# Patient Record
Sex: Male | Born: 1960 | Race: Black or African American | Hispanic: No | Marital: Single | Smoking: Current some day smoker
Health system: Southern US, Community
[De-identification: ages and names within clinical notes are randomized; demographics above are authoritative.]

## PROBLEM LIST (undated history)

## (undated) DIAGNOSIS — F1910 Other psychoactive substance abuse, uncomplicated: Secondary | ICD-10-CM

## (undated) HISTORY — PX: OTHER SURGICAL HISTORY: SHX169

---

## 1999-02-13 ENCOUNTER — Emergency Department (HOSPITAL_COMMUNITY): Admission: EM | Admit: 1999-02-13 | Discharge: 1999-02-13 | Payer: Self-pay | Admitting: Emergency Medicine

## 2006-11-01 ENCOUNTER — Emergency Department (HOSPITAL_COMMUNITY): Admission: EM | Admit: 2006-11-01 | Discharge: 2006-11-01 | Payer: Self-pay | Admitting: Emergency Medicine

## 2006-11-19 ENCOUNTER — Emergency Department (HOSPITAL_COMMUNITY): Admission: EM | Admit: 2006-11-19 | Discharge: 2006-11-19 | Payer: Self-pay | Admitting: Family Medicine

## 2007-04-26 ENCOUNTER — Encounter: Admission: RE | Admit: 2007-04-26 | Discharge: 2007-04-26 | Payer: Self-pay | Admitting: Internal Medicine

## 2009-01-13 ENCOUNTER — Emergency Department (HOSPITAL_COMMUNITY): Admission: EM | Admit: 2009-01-13 | Discharge: 2009-01-13 | Payer: Self-pay | Admitting: Emergency Medicine

## 2010-06-24 LAB — BASIC METABOLIC PANEL
BUN: 9 mg/dL (ref 6–23)
Calcium: 9.2 mg/dL (ref 8.4–10.5)
Chloride: 102 mEq/L (ref 96–112)
Glucose, Bld: 102 mg/dL — ABNORMAL HIGH (ref 70–99)
Potassium: 3.3 mEq/L — ABNORMAL LOW (ref 3.5–5.1)
Sodium: 138 mEq/L (ref 135–145)

## 2010-06-24 LAB — URINALYSIS, ROUTINE W REFLEX MICROSCOPIC
Hgb urine dipstick: NEGATIVE
Nitrite: NEGATIVE

## 2010-06-24 LAB — GC/CHLAMYDIA PROBE AMP, URINE
Chlamydia, Swab/Urine, PCR: NEGATIVE
GC Probe Amp, Urine: NEGATIVE

## 2010-06-24 LAB — CBC
HCT: 44.9 % (ref 39.0–52.0)
Hemoglobin: 15.4 g/dL (ref 13.0–17.0)
MCHC: 34.2 g/dL (ref 30.0–36.0)
MCV: 102.2 fL — ABNORMAL HIGH (ref 78.0–100.0)
Platelets: 169 10*3/uL (ref 150–400)
WBC: 3.9 10*3/uL — ABNORMAL LOW (ref 4.0–10.5)

## 2010-06-24 LAB — SAMPLE TO BLOOD BANK

## 2010-06-24 LAB — URINE MICROSCOPIC-ADD ON

## 2010-06-24 LAB — URINE CULTURE: Culture: NO GROWTH

## 2016-01-27 ENCOUNTER — Encounter (HOSPITAL_COMMUNITY): Payer: Self-pay | Admitting: Emergency Medicine

## 2016-01-27 ENCOUNTER — Ambulatory Visit (HOSPITAL_COMMUNITY)
Admission: EM | Admit: 2016-01-27 | Discharge: 2016-01-27 | Disposition: A | Discharge status: Home / Self Care | Payer: Self-pay | Attending: Family Medicine | Admitting: Family Medicine

## 2016-01-27 DIAGNOSIS — Z202 Contact with and (suspected) exposure to infections with a predominantly sexual mode of transmission: Secondary | ICD-10-CM | POA: Insufficient documentation

## 2016-01-27 DIAGNOSIS — F17210 Nicotine dependence, cigarettes, uncomplicated: Secondary | ICD-10-CM | POA: Insufficient documentation

## 2016-01-27 MED ORDER — CEFTRIAXONE SODIUM 250 MG IJ SOLR
250.0000 mg | Freq: Once | INTRAMUSCULAR | Status: AC
  Administered 2016-01-27: 250 mg via INTRAMUSCULAR

## 2016-01-27 MED ORDER — AZITHROMYCIN 250 MG PO TABS
ORAL_TABLET | ORAL | Status: AC
  Filled 2016-01-27: qty 4

## 2016-01-27 MED ORDER — CEFTRIAXONE SODIUM 250 MG IJ SOLR
INTRAMUSCULAR | Status: AC
  Filled 2016-01-27: qty 250

## 2016-01-27 MED ORDER — AZITHROMYCIN 250 MG PO TABS
1000.0000 mg | ORAL_TABLET | Freq: Once | ORAL | Status: AC
  Administered 2016-01-27: 1000 mg via ORAL

## 2016-01-27 NOTE — ED Provider Notes (Signed)
[facility]    CSN: [id number] Arrival date & time: 01/27/16  1627     History   Chief Complaint Chief Complaint  Patient presents with  . Exposure to STD    HPI Timothy Baker is a 55 y.o. male.   The history is provided by the patient.  Exposure to STD  This is a new problem. The current episode started yesterday (told by male yest of [facility]  [facility] The problem has not changed since onset.Pertinent negatives include no chest pain and no abdominal pain. Nothing aggravates the symptoms. Nothing relieves the symptoms.    History reviewed. No pertinent past medical history.  There are no active problems to display for this patient.   History reviewed. No pertinent surgical history.     Home Medications    Prior to Admission medications   Not on File    Family History History reviewed. No pertinent family history.  Social History Social History  Substance Use Topics  . Smoking status: Current Some Day Smoker    Years: 25.00    Types: Cigarettes  . Smokeless tobacco: Never Used  . Alcohol use No     Allergies   Patient has no known allergies.   Review of Systems Review of Systems  Constitutional: Negative.   Cardiovascular: Negative for chest pain.  Gastrointestinal: Negative for abdominal pain.  Genitourinary: Negative for discharge, dysuria, penile pain, penile swelling, scrotal swelling and testicular pain.  All other systems reviewed and are negative.    Physical Exam Triage Vital Signs ED Triage Vitals  Enc Vitals Group     BP 01/27/16 1659 154/74     Pulse Rate 01/27/16 1659 92     Resp 01/27/16 1659 18     Temp 01/27/16 1659 98.7 F (37.1 C)     Temp Source 01/27/16 1659 Oral     SpO2 01/27/16 1659 100 %     Weight --      Height --      Head Circumference --      Peak Flow --      Pain Score 01/27/16 1703 0     Pain Loc --      Pain Edu? --      Excl. in GC? --    No data  found.   Updated Vital Signs BP 154/74 (BP Location: Left Arm)   Pulse 92   Temp 98.7 F (37.1 C) (Oral)   Resp 18   SpO2 100%   Visual Acuity Right Eye Distance:   Left Eye Distance:   Bilateral Distance:    Right Eye Near:   Left Eye Near:    Bilateral Near:     Physical Exam  Constitutional: He is oriented to person, place, and time. He appears well-developed and well-nourished. No distress.  Abdominal: Soft. Bowel sounds are normal.  Genitourinary: Penis normal.  Neurological: He is alert and oriented to person, place, and time.  Nursing note and vitals reviewed.    UC Treatments / Results  Labs (all labs ordered are listed, but only abnormal results are displayed) Labs Reviewed - No data to display  EKG  EKG Interpretation None       Radiology No results found.  Procedures Procedures (including critical care time)  Medications Ordered in UC Medications - No data to display   Initial Impression / Assessment and Plan / UC Course  I have reviewed the triage vital signs and the nursing  notes.  Pertinent labs & imaging results that were available during my care of the patient were reviewed by me and considered in my medical decision making (see chart for details).  Clinical Course       Final Clinical Impressions(s) / UC Diagnoses   Final diagnoses:  None    New Prescriptions New Prescriptions   No medications on file     Linna HoffJames D Dekota Shenk, MD 01/27/16 1737

## 2016-01-27 NOTE — Discharge Instructions (Signed)
We will call with test results and treat as indicated °

## 2016-01-27 NOTE — ED Triage Notes (Signed)
[facility] with a [facility] of an exposure to an STD. The patient stated that one of his partners called him and advised him that she had tested positive for an STD 1 week ago. The patient was unsure of the type of STD.

## 2016-01-28 LAB — URINE CYTOLOGY ANCILLARY ONLY
Chlamydia: NEGATIVE
NEISSERIA GONORRHEA: NEGATIVE
TRICH (WINDOWPATH): POSITIVE — AB

## 2016-01-30 ENCOUNTER — Telehealth (HOSPITAL_COMMUNITY): Payer: Self-pay | Admitting: Emergency Medicine

## 2016-01-30 MED ORDER — METRONIDAZOLE 500 MG PO TABS: 500.0000 mg | ORAL_TABLET | Freq: Two times a day (BID) | ORAL | 0 refills | Status: DC

## 2016-01-30 NOTE — Telephone Encounter (Signed)
Called pt and notified of recent lab results Pt ID'd properly... Reports feeling better and sx have subsided Per his request... E-Rx med to [facility] ([address]) Adv pt if sx are not getting better to return or to f/u w/PCP Education on safe sex given Also adv pt to notify partner(s) Pt verb understanding.

## 2016-01-30 NOTE — Telephone Encounter (Signed)
-----   Message from Charm RingsErin J Honig, MD sent at 01/28/2016  5:37 PM EST ----- Please notify patient that he is positive for trichomonas.  Call in Flagyl 500mg  BID x7 days, #14 to pharmacy of choice.  No alcohol while taking flagyl.  EH

## 2016-02-01 LAB — URINE CYTOLOGY ANCILLARY ONLY: Bacterial vaginitis: POSITIVE — AB

## 2017-01-19 ENCOUNTER — Other Ambulatory Visit: Payer: Self-pay | Admitting: Occupational Medicine

## 2017-01-19 ENCOUNTER — Ambulatory Visit: Payer: Self-pay

## 2017-01-19 DIAGNOSIS — M79672 Pain in left foot: Secondary | ICD-10-CM

## 2017-07-23 ENCOUNTER — Emergency Department (HOSPITAL_COMMUNITY)
Admission: EM | Admit: 2017-07-23 | Discharge: 2017-07-23 | Disposition: A | Discharge status: Home / Self Care | Payer: Self-pay | Attending: Emergency Medicine | Admitting: Emergency Medicine

## 2017-07-23 ENCOUNTER — Emergency Department (HOSPITAL_COMMUNITY): Payer: Self-pay

## 2017-07-23 ENCOUNTER — Encounter (HOSPITAL_COMMUNITY): Payer: Self-pay | Admitting: Emergency Medicine

## 2017-07-23 DIAGNOSIS — J40 Bronchitis, not specified as acute or chronic: Secondary | ICD-10-CM

## 2017-07-23 DIAGNOSIS — F17210 Nicotine dependence, cigarettes, uncomplicated: Secondary | ICD-10-CM | POA: Insufficient documentation

## 2017-07-23 DIAGNOSIS — B9789 Other viral agents as the cause of diseases classified elsewhere: Secondary | ICD-10-CM

## 2017-07-23 DIAGNOSIS — J069 Acute upper respiratory infection, unspecified: Secondary | ICD-10-CM

## 2017-07-23 MED ORDER — GUAIFENESIN ER 1200 MG PO TB12: 1.0000 | ORAL_TABLET | Freq: Two times a day (BID) | ORAL | 0 refills | Status: DC

## 2017-07-23 MED ORDER — DOXYCYCLINE HYCLATE 100 MG PO CAPS: 100.0000 mg | ORAL_CAPSULE | Freq: Two times a day (BID) | ORAL | 0 refills | Status: DC

## 2017-07-23 MED ORDER — PREDNISONE 50 MG PO TABS: 50.0000 mg | ORAL_TABLET | Freq: Every day | ORAL | 0 refills | Status: DC

## 2017-07-23 MED ORDER — PROMETHAZINE-DM 6.25-15 MG/5ML PO SYRP: 5.0000 mL | ORAL_SOLUTION | Freq: Four times a day (QID) | ORAL | 0 refills | Status: DC | PRN

## 2017-07-23 MED ORDER — AEROCHAMBER PLUS FLO-VU MEDIUM MISC
1.0000 | Freq: Once | Status: DC
  Filled 2017-07-23: qty 1

## 2017-07-23 MED ORDER — ALBUTEROL SULFATE HFA 108 (90 BASE) MCG/ACT IN AERS: 2.0000 | INHALATION_SPRAY | Freq: Four times a day (QID) | RESPIRATORY_TRACT | Status: DC

## 2017-07-23 NOTE — ED Triage Notes (Signed)
Pt reports productive cough and scratchy throat x2 weeks. Pt reports he feels like he has had a fever at home, took ibuprofen for it.

## 2017-07-23 NOTE — Discharge Instructions (Addendum)
Your chest x-ray did not show any signs of pneumonia at this time.  Follow-up with your primary care doctor for a recheck.  Increase your fluid intake and rest as much as possible.  I would advise to decrease her smoking as much as possible until you start to improve.

## 2017-07-23 NOTE — ED Provider Notes (Signed)
[facility] EMERGENCY DEPARTMENT Provider Note   CSN: [id number] Arrival date & time: 07/23/17  0706     History   Chief Complaint Chief Complaint  Patient presents with  . URI    HPI SOPHEAP BOEHLE is a 57 y.o. male.  HPI Patient presents to the emergency department with cough nasal congestion sore throat over the last 2 weeks.  The patient states that he has been taking over-the-counter cough and cold medications without significant relief of his symptoms.  The patient states he is a smoker.  The patient states that he is now coughs so much these having chest discomfort when he coughs and takes a deep breath.  Patient states nothing seems to make the condition better.  Patient denies nausea, vomiting, headache, blurred vision, throat swelling, weakness, dizziness, fever or syncope. History reviewed. No pertinent past medical history.  There are no active problems to display for this patient.   History reviewed. No pertinent surgical history.      Home Medications    Prior to Admission medications   Medication Sig Start Date End Date Taking? Authorizing Provider  metroNIDAZOLE (FLAGYL) 500 MG tablet Take 1 tablet (500 mg total) by mouth 2 (two) times daily. 01/30/16   Charm Rings, MD    Family History No family history on file.  Social History Social History   Tobacco Use  . Smoking status: Current Some Day Smoker    Years: 25.00    Types: Cigarettes  . Smokeless tobacco: Never Used  Substance Use Topics  . Alcohol use: No  . Drug use: No     Allergies   Patient has no known allergies.   Review of Systems Review of Systems All other systems negative except as documented in the HPI. All pertinent positives and negatives as reviewed in the HPI.  Physical Exam Updated Vital Signs BP (!) 149/102   Pulse (!) 105   Temp 99.4 F (37.4 C) (Oral)   Resp 15   SpO2 100%   Physical Exam  Constitutional: He is oriented to person,  place, and time. He appears well-developed and well-nourished. No distress.  HENT:  Head: Normocephalic and atraumatic.  Mouth/Throat: Oropharynx is clear and moist.  Eyes: Pupils are equal, round, and reactive to light.  Neck: Normal range of motion. Neck supple.  Cardiovascular: Normal rate, regular rhythm and normal heart sounds. Exam reveals no gallop and no friction rub.  No murmur heard. Pulmonary/Chest: Effort normal and breath sounds normal. No respiratory distress. He has no wheezes.  Neurological: He is alert and oriented to person, place, and time. He exhibits normal muscle tone. Coordination normal.  Skin: Skin is warm and dry. Capillary refill takes less than 2 seconds. No rash noted. No erythema.  Psychiatric: He has a normal mood and affect. His behavior is normal.  Nursing note and vitals reviewed.    ED Treatments / Results  Labs (all labs ordered are listed, but only abnormal results are displayed) Labs Reviewed - No data to display  EKG None  Radiology Dg Chest 2 View  Result Date: 07/23/2017 CLINICAL DATA:  57 year old male with history of cough for the past 2 weeks. Central chest pain. EXAM: CHEST - 2 VIEW COMPARISON:  No priors. FINDINGS: Lung volumes are normal. No consolidative airspace disease. No pleural effusions. No pneumothorax. No pulmonary nodule or mass noted. Pulmonary vasculature and the cardiomediastinal silhouette are within normal limits. IMPRESSION: No radiographic evidence of acute cardiopulmonary disease. Electronically Signed  By: Trudie Reed M.D.   On: 07/23/2017 08:24    Procedures Procedures (including critical care time)  Medications Ordered in ED Medications - No data to display   Initial Impression / Assessment and Plan / ED Course  I have reviewed the triage vital signs and the nursing notes.  Pertinent labs & imaging results that were available during my care of the patient were reviewed by me and considered in my medical  decision making (see chart for details).    Patient be treated for a viral URI with cough.  Patient is a smoker and have explained to him that this will make this condition more protracted due to the chronic irritation from his smoking.  Patient is advised to increase his fluid intake and rest as much as possible. Final Clinical Impressions(s) / ED Diagnoses   Final diagnoses:  None    ED Discharge Orders    None       Charlestine Night, PA-C 07/23/17 0949    Terrilee Files, MD 07/24/17 [phone]

## 2017-10-04 ENCOUNTER — Emergency Department (HOSPITAL_COMMUNITY): Payer: BLUE CROSS/BLUE SHIELD

## 2017-10-04 ENCOUNTER — Emergency Department (HOSPITAL_COMMUNITY)
Admission: EM | Admit: 2017-10-04 | Discharge: 2017-10-04 | Disposition: A | Discharge status: Home / Self Care | Payer: BLUE CROSS/BLUE SHIELD | Attending: Emergency Medicine | Admitting: Emergency Medicine

## 2017-10-04 ENCOUNTER — Other Ambulatory Visit: Payer: Self-pay

## 2017-10-04 ENCOUNTER — Encounter (HOSPITAL_COMMUNITY): Payer: Self-pay | Admitting: Emergency Medicine

## 2017-10-04 DIAGNOSIS — T675XXA Heat exhaustion, unspecified, initial encounter: Secondary | ICD-10-CM | POA: Insufficient documentation

## 2017-10-04 DIAGNOSIS — Z79899 Other long term (current) drug therapy: Secondary | ICD-10-CM | POA: Insufficient documentation

## 2017-10-04 DIAGNOSIS — N3090 Cystitis, unspecified without hematuria: Secondary | ICD-10-CM | POA: Diagnosis not present

## 2017-10-04 DIAGNOSIS — R42 Dizziness and giddiness: Secondary | ICD-10-CM | POA: Diagnosis not present

## 2017-10-04 DIAGNOSIS — F17210 Nicotine dependence, cigarettes, uncomplicated: Secondary | ICD-10-CM | POA: Insufficient documentation

## 2017-10-04 DIAGNOSIS — N3000 Acute cystitis without hematuria: Secondary | ICD-10-CM

## 2017-10-04 DIAGNOSIS — R079 Chest pain, unspecified: Secondary | ICD-10-CM | POA: Diagnosis present

## 2017-10-04 LAB — COMPREHENSIVE METABOLIC PANEL
ALBUMIN: 3.7 g/dL (ref 3.5–5.0)
ALT: 31 U/L (ref 0–44)
ANION GAP: 6 (ref 5–15)
AST: 33 U/L (ref 15–41)
Alkaline Phosphatase: 55 U/L (ref 38–126)
BILIRUBIN TOTAL: 0.7 mg/dL (ref 0.3–1.2)
BUN: 8 mg/dL (ref 6–20)
CHLORIDE: 106 mmol/L (ref 98–111)
CO2: 26 mmol/L (ref 22–32)
Calcium: 9.3 mg/dL (ref 8.9–10.3)
Creatinine, Ser: 1.09 mg/dL (ref 0.61–1.24)
GFR calc Af Amer: >60 mL/min (ref >60)
GLUCOSE: 95 mg/dL (ref 70–99)
POTASSIUM: 3.9 mmol/L (ref 3.5–5.1)
Sodium: 138 mmol/L (ref 135–145)
TOTAL PROTEIN: 6.9 g/dL (ref 6.5–8.1)

## 2017-10-04 LAB — CBC WITH DIFFERENTIAL/PLATELET
Abs Immature Granulocytes: 0 10*3/uL (ref 0.0–0.1)
Basophils Absolute: 0 10*3/uL (ref 0.0–0.1)
Basophils Relative: 0 %
EOS ABS: 0 10*3/uL (ref 0.0–0.7)
EOS PCT: 0 %
HEMATOCRIT: 42.3 % (ref 39.0–52.0)
Hemoglobin: 14 g/dL (ref 13.0–17.0)
IMMATURE GRANULOCYTES: 0 %
LYMPHS ABS: 1.8 10*3/uL (ref 0.7–4.0)
Lymphocytes Relative: 19 %
MCH: 31.6 pg (ref 26.0–34.0)
MCHC: 33.1 g/dL (ref 30.0–36.0)
MCV: 95.5 fL (ref 78.0–100.0)
Monocytes Absolute: 0.8 10*3/uL (ref 0.1–1.0)
Monocytes Relative: 8 %
NEUTROS PCT: 73 %
Neutro Abs: 7 10*3/uL (ref 1.7–7.7)
PLATELETS: 155 10*3/uL (ref 150–400)
RBC: 4.43 MIL/uL (ref 4.22–5.81)
RDW: 11.6 % (ref 11.5–15.5)
WBC: 9.6 10*3/uL (ref 4.0–10.5)

## 2017-10-04 LAB — URINALYSIS, ROUTINE W REFLEX MICROSCOPIC
Glucose, UA: NEGATIVE mg/dL
Hgb urine dipstick: NEGATIVE
Ketones, ur: NEGATIVE mg/dL
Nitrite: NEGATIVE
PROTEIN: 100 mg/dL — AB
SPECIFIC GRAVITY, URINE: 1.032 — AB (ref 1.005–1.030)
WBC, UA: >50 WBC/hpf — ABNORMAL HIGH (ref 0–5)
pH: 5 (ref 5.0–8.0)

## 2017-10-04 LAB — LIPASE, BLOOD: LIPASE: 26 U/L (ref 11–51)

## 2017-10-04 LAB — I-STAT TROPONIN, ED
TROPONIN I, POC: 0 ng/mL (ref 0.00–0.08)
TROPONIN I, POC: 0.01 ng/mL (ref 0.00–0.08)

## 2017-10-04 LAB — I-STAT CG4 LACTIC ACID, ED
LACTIC ACID, VENOUS: 1.46 mmol/L (ref 0.5–1.9)
LACTIC ACID, VENOUS: 0.42 mmol/L — AB (ref 0.5–1.9)

## 2017-10-04 MED ORDER — NITROFURANTOIN MONOHYD MACRO 100 MG PO CAPS: 100.0000 mg | ORAL_CAPSULE | Freq: Two times a day (BID) | ORAL | 0 refills | Status: DC

## 2017-10-04 NOTE — ED Provider Notes (Signed)
Patient placed in Quick Look pathway, seen and evaluated   Chief Complaint: fever  HPI:   Felt fever, light-headedness, weak, anorexia this morning. Had coworker check his BP and was told it was "high", unknown number. Took 4 ibuprofen PTA. +cough, +dysuria (sometimes), +chest pounding.   ROS: No nausea, vomiting, abdominal pain, changes in BM  Physical Exam:   Gen: No distress  Neuro: Awake and Alert  Skin: Warm    Focused Exam: Feels warm to touch.  Lungs CTAB.  Pain with deep inspiration pt points to central lower chest/epigastrium. Borderline tachy/febrile.    Initiation of care has begun. The patient has been counseled on the process, plan, and necessity for staying for the completion/evaluation, and the remainder of the medical screening examination   Liberty HandyGibbons, Claudia J, PA-C 10/04/17 1508    Melene PlanFloyd, Dan, DO 10/04/17 1617

## 2017-10-04 NOTE — ED Triage Notes (Signed)
Pt presents to ED for assessment of sudden onset on waking this morning of feeling feverish (99.9 at triage), nausea, decreased appetite, high BP at work, chest pressure, nasal congestion.  [facility]

## 2017-10-04 NOTE — ED Notes (Addendum)
Pt aware of urine specimen needed.  ?

## 2017-10-04 NOTE — Discharge Instructions (Signed)
Please read and follow all provided instructions.  Your diagnoses today include:  1. Heat exhaustion, initial encounter   2. Dizziness     Tests performed today include:  An EKG of your heart  A chest x-ray  Cardiac enzymes - a blood test for heart muscle damage  Blood counts and electrolytes  Urine test - shows urine infection  Vital signs. See below for your results today.   Medications prescribed:   Macrobid - antibiotic for urinary tract infection  You have been prescribed an antibiotic medicine: take the entire course of medicine even if you are feeling better. Stopping early can cause the antibiotic not to work.  Take any prescribed medications only as directed.  Follow-up instructions: Please follow-up with your primary care provider as soon as you can for further evaluation of your symptoms.   Double your fluid intake over the next 2 to 3 days and take plenty of breaks when working outside. Return instructions:  SEEK IMMEDIATE MEDICAL ATTENTION IF:  You have severe chest pain, especially if the pain is crushing or pressure-like and spreads to the arms, back, neck, or jaw, or if you have sweating, nausea (feeling sick to your stomach), or shortness of breath. THIS IS AN EMERGENCY. Don't wait to see if the pain will go away. Get medical help at once. Call 911 or 0 (operator). DO NOT drive yourself to the hospital.   Your chest pain gets worse and does not go away with rest.   You have an attack of chest pain lasting longer than usual, despite rest and treatment with the medications your caregiver has prescribed.   You wake from sleep with chest pain or shortness of breath.  You feel dizzy or faint.  You have chest pain not typical of your usual pain for which you originally saw your caregiver.   You have any other emergent concerns regarding your health.  Additional Information: Chest pain comes from many different causes. Your caregiver has diagnosed you as  having chest pain that is not specific for one problem, but does not require admission.  You are at low risk for an acute heart condition or other serious illness.   Your vital signs today were: BP 120/75    Pulse 69    Temp 98.8 F (37.1 C) (Oral)    Resp 17    SpO2 100%  If your blood pressure (BP) was elevated above 135/85 this visit, please have this repeated by your doctor within one month. --------------

## 2017-10-04 NOTE — ED Provider Notes (Signed)
[facility] EMERGENCY DEPARTMENT Provider Note   CSN: [id number] Arrival date & time: 10/04/17  1343     History   Chief Complaint Chief Complaint  Patient presents with  . Chest Pain    HPI Timothy Baker is a 57 y.o. male.  Patient with no significant past medical history presents with complaint of dizziness, lightheadedness, anorexia starting this morning.  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  He drank coffee and did his normal routine this morning.  He took a break at about 8:30am, drank a bit and ate a sandwich although he was not feeling well.  Patient continued to work.  Eventually he decided to go into a cooler area and drink some Gatorade because he was feeling dizzy.  Someone at his workplace took his blood pressure and it was noted to be in the 160s systolic range.  Patient had some vague chest pains without radiation during this time.  Symptoms have since resolved.  States that he has not had symptoms like this in the past.  No diaphoresis or vomiting.  Since being in the emergency department and resting, he feels much better.  No history of hypertension, high cholesterol, diabetes.  Patient is a smoker.  No strong family history of coronary artery disease.  Patient reports occasional cough, burning with urination, racing pounding heart.  The onset of this condition was acute. The course is constant. Aggravating factors: activity. Alleviating factors: rest.       History reviewed. No pertinent past medical history.  There are no active problems to display for this patient.   History reviewed. No pertinent surgical history.      Home Medications    Prior to Admission medications   Medication Sig Start Date End Date Taking? Authorizing Provider  doxycycline (VIBRAMYCIN) 100 MG capsule Take 1 capsule (100 mg total) by mouth 2 (two) times daily. Patient not taking: Reported on 10/04/2017 07/23/17   Charlestine Night, PA-C  Guaifenesin 1200 MG TB12  Take 1 tablet (1,200 mg total) by mouth 2 (two) times daily. Patient not taking: Reported on 10/04/2017 07/23/17   Charlestine Night, PA-C  metroNIDAZOLE (FLAGYL) 500 MG tablet Take 1 tablet (500 mg total) by mouth 2 (two) times daily. Patient not taking: Reported on 10/04/2017 01/30/16   Charm Rings, MD  predniSONE (DELTASONE) 50 MG tablet Take 1 tablet (50 mg total) by mouth daily. Patient not taking: Reported on 10/04/2017 07/23/17   Charlestine Night, PA-C  promethazine-dextromethorphan (PROMETHAZINE-DM) 6.25-15 MG/5ML syrup Take 5 mLs by mouth 4 (four) times daily as needed for cough. Patient not taking: Reported on 10/04/2017 07/23/17   Charlestine Night, PA-C    Family History History reviewed. No pertinent family history.  Social History Social History   Tobacco Use  . Smoking status: Current Some Day Smoker    Years: 25.00    Types: Cigarettes  . Smokeless tobacco: Never Used  Substance Use Topics  . Alcohol use: No  . Drug use: No     Allergies   Patient has no known allergies.   Review of Systems Review of Systems  Constitutional: Positive for appetite change. Negative for diaphoresis and fever.  Eyes: Negative for redness.  Respiratory: Negative for cough and shortness of breath.   Cardiovascular: Positive for chest pain and palpitations. Negative for leg swelling.  Gastrointestinal: Negative for abdominal pain, nausea and vomiting.  Genitourinary: Negative for dysuria.  Musculoskeletal: Negative for back pain and neck pain.  Skin: Negative for rash.  Neurological: Positive for dizziness and light-headedness. Negative for syncope.  Psychiatric/Behavioral: The patient is not nervous/anxious.      Physical Exam Updated Vital Signs BP 117/83 (BP Location: Left Arm)   Pulse 70   Temp 98.8 F (37.1 C) (Oral)   Resp 17   SpO2 100%   Physical Exam  Constitutional: He appears well-developed and well-nourished.  HENT:  Head: Normocephalic and atraumatic.    Mouth/Throat: Mucous membranes are normal. Mucous membranes are not dry.  Eyes: Conjunctivae are normal.  Neck: Trachea normal and normal range of motion. Neck supple. Normal carotid pulses and no JVD present. No muscular tenderness present. Carotid bruit is not present. No tracheal deviation present.  Cardiovascular: Normal rate, regular rhythm, S1 normal, S2 normal, normal heart sounds and intact distal pulses. Exam reveals no distant heart sounds and no decreased pulses.  No murmur heard. Pulmonary/Chest: Effort normal and breath sounds normal. No respiratory distress. He has no wheezes. He exhibits no tenderness.  Abdominal: Soft. Normal aorta and bowel sounds are normal. There is no tenderness. There is no rebound and no guarding.  Musculoskeletal: He exhibits no edema.       Right lower leg: He exhibits no tenderness and no edema.       Left lower leg: He exhibits no tenderness and no edema.  Neurological: He is alert.  Skin: Skin is warm and dry. He is not diaphoretic. No cyanosis. No pallor.  Psychiatric: He has a normal mood and affect.  Nursing note and vitals reviewed.    ED Treatments / Results  Labs (all labs ordered are listed, but only abnormal results are displayed) Labs Reviewed  URINALYSIS, ROUTINE W REFLEX MICROSCOPIC - Abnormal; Notable for the following components:      Result Value   Color, Urine AMBER (*)    APPearance CLOUDY (*)    Specific Gravity, Urine 1.032 (*)    Bilirubin Urine SMALL (*)    Protein, ur 100 (*)    Leukocytes, UA MODERATE (*)    WBC, UA >50 (*)    Bacteria, UA MANY (*)    Non Squamous Epithelial 0-5 (*)    All other components within normal limits  I-STAT CG4 LACTIC ACID, ED - Abnormal; Notable for the following components:   Lactic Acid, Venous 0.42 (*)    All other components within normal limits  URINE CULTURE  CBC WITH DIFFERENTIAL/PLATELET  COMPREHENSIVE METABOLIC PANEL  LIPASE, BLOOD  I-STAT TROPONIN, ED  I-STAT CG4 LACTIC  ACID, ED  I-STAT TROPONIN, ED    EKG EKG Interpretation  Date/Time:  Wednesday October 04 2017 13:49:34 EDT Ventricular Rate:  101 PR Interval:  112 QRS Duration: 76 QT Interval:  332 QTC Calculation: 430 R Axis:   72 Text Interpretation:  Sinus tachycardia Otherwise normal ECG no prior to compare with Confirmed by Meridee ScoreButler, Michael [phone]([id number]) on 10/04/2017 4:21:19 PM   Radiology Dg Chest 2 View  Result Date: 10/04/2017 CLINICAL DATA:  Chest pain EXAM: CHEST - 2 VIEW COMPARISON:  07/23/2017 FINDINGS: The heart size and mediastinal contours are within normal limits. Both lungs are clear. The visualized skeletal structures are unremarkable. IMPRESSION: No active cardiopulmonary disease. Electronically Signed   By: Deatra RobinsonKevin  Herman M.D.   On: 10/04/2017 15:32    Procedures Procedures (including critical care time)  Medications Ordered in ED Medications - No data to display   Initial Impression / Assessment and Plan / ED Course  I have reviewed the triage vital signs and the nursing  notes.  Pertinent labs & imaging results that were available during my care of the patient were reviewed by me and considered in my medical decision making (see chart for details).     Patient seen and examined.  Infectious work-up started in triage is negative.  Pending UA.  Given lightheadedness, vague chest pains will check a delta troponin and repeat EKG.  Patient has a low risk heart score.  Low concern for ACS.  Vital signs reviewed and are as follows: BP 117/83 (BP Location: Left Arm)   Pulse 70   Temp 98.8 F (37.1 C) (Oral)   Resp 17   SpO2 100%   6:38 PM remainder of lab work reassuring --although microscopic demonstrates bacteria with elevated white blood cell count.  Patient asked about signs of urethritis which he denies.  He states that he is sexually active with one partner, his wife.  He states that he was last checked for sexual transmitted infections about a year ago.  Home on macrobid  for this. Encouraged PCP f/u.   Final Clinical Impressions(s) / ED Diagnoses   Final diagnoses:  Heat exhaustion, initial encounter  Dizziness  Acute cystitis without hematuria   Patient with vague symptoms of chest pain and dizziness after working outdoors today.  Suspect element of heat exhaustion.  Urine is concentrated.  Remainder of cardiac work-up including troponin x2, EKG x2 are reassuring.  Acute cystitis: Patient has had some burning with urination at times.  Low concern for prostatitis or urethritis.  Treatment as above.  PCP follow-up indicated.  ED Discharge Orders        Ordered    nitrofurantoin, macrocrystal-monohydrate, (MACROBID) 100 MG capsule  2 times daily     10/04/17 1837       Renne Crigler, PA-C 10/04/17 1840    Terrilee Files, MD 10/06/17 1610

## 2017-10-07 LAB — URINE CULTURE: Culture: >=100000 — AB

## 2019-07-11 ENCOUNTER — Ambulatory Visit: Payer: Self-pay | Attending: Internal Medicine

## 2019-07-11 DIAGNOSIS — Z23 Encounter for immunization: Secondary | ICD-10-CM

## 2019-07-11 NOTE — Progress Notes (Signed)
   Covid-19 Vaccination Clinic  Name:  BODIN GORKA    MRN: [id number] DOB: 1960-09-09  07/11/2019  Mr. Gudino was observed post Covid-19 immunization for 15 minutes without incident. He was provided with Vaccine Information Sheet and instruction to access the V-Safe system.   Mr. Stalvey was instructed to call 911 with any severe reactions post vaccine: Marland Kitchen Difficulty breathing  . Swelling of face and throat  . A fast heartbeat  . A bad rash all over body  . Dizziness and weakness   Immunizations Administered    Name Date Dose VIS Date Route   Pfizer COVID-19 Vaccine 07/11/2019  3:46 PM 0.3 mL 05/15/2018 Intramuscular   Manufacturer: [employer], [employer]   Lot: [id number]   NDC: [address]-9

## 2019-08-05 ENCOUNTER — Ambulatory Visit: Payer: Self-pay | Attending: Internal Medicine

## 2019-08-05 DIAGNOSIS — Z23 Encounter for immunization: Secondary | ICD-10-CM

## 2019-08-05 NOTE — Progress Notes (Signed)
   Covid-19 Vaccination Clinic  Name:  Timothy Baker    MRN: [id number] DOB: June 25, 1960  08/05/2019  [name] was observed post Covid-19 immunization for 15 minutes without incident. He was provided with Vaccine Information Sheet and instruction to access the V-Safe system.   [name] was instructed to call 911 with any severe reactions post vaccine: Marland Kitchen Difficulty breathing  . Swelling of face and throat  . A fast heartbeat  . A bad rash all over body  . Dizziness and weakness   Immunizations Administered    Name Date Dose VIS Date Route   Pfizer COVID-19 Vaccine 08/05/2019  4:00 PM 0.3 mL 05/15/2018 Intramuscular   Manufacturer: [employer], [employer]   Lot: [id number]   NDC: [address]-3

## 2020-04-13 ENCOUNTER — Encounter (HOSPITAL_COMMUNITY): Payer: Self-pay

## 2020-04-13 ENCOUNTER — Other Ambulatory Visit: Payer: Self-pay

## 2020-04-13 DIAGNOSIS — F17210 Nicotine dependence, cigarettes, uncomplicated: Secondary | ICD-10-CM | POA: Diagnosis not present

## 2020-04-13 DIAGNOSIS — M545 Low back pain, unspecified: Secondary | ICD-10-CM | POA: Insufficient documentation

## 2020-04-13 NOTE — ED Triage Notes (Signed)
Patient arrived stating he has back pain every day but today when he had to use the restroom is was hard for him to get up out of the bed. Patient ambulatory in triage. Took aspirin at 8pm, states now it does not hurt.

## 2020-04-14 ENCOUNTER — Emergency Department (HOSPITAL_COMMUNITY)
Admission: EM | Admit: 2020-04-14 | Discharge: 2020-04-14 | Disposition: A | Discharge status: Home / Self Care | Payer: BC Managed Care – PPO | Attending: Emergency Medicine | Admitting: Emergency Medicine

## 2020-04-14 DIAGNOSIS — M545 Low back pain, unspecified: Secondary | ICD-10-CM

## 2020-04-14 MED ORDER — CYCLOBENZAPRINE HCL 10 MG PO TABS: 10.0000 mg | ORAL_TABLET | Freq: Two times a day (BID) | ORAL | 0 refills | Status: DC | PRN

## 2020-04-14 NOTE — Discharge Instructions (Signed)
Continue taking Aleve.  Take muscle relaxant Flexeril as needed but do not take if operating heavy machinery or driving as it can make you sleepy

## 2020-04-14 NOTE — ED Provider Notes (Signed)
[facility]-EMERGENCY DEPT Provider Note   CSN: [id number] Arrival date & time: 04/13/20  2318     History Chief Complaint  Patient presents with  . Back Pain    Timothy Baker is a 60 y.o. male.  The history is provided by the patient.  Back Pain Location:  Lumbar spine Quality:  Aching Radiates to:  Does not radiate Pain severity:  Mild Onset quality:  Gradual Timing:  Intermittent Progression:  Improving Relieved by:  NSAIDs Worsened by:  Palpation Associated symptoms: no abdominal pain, no abdominal swelling, no bladder incontinence, no bowel incontinence, no chest pain, no dysuria, no fever, no headaches, no leg pain, no numbness, no paresthesias, no pelvic pain, no perianal numbness, no tingling, no weakness and no weight loss        History reviewed. No pertinent past medical history.  There are no problems to display for this patient.   History reviewed. No pertinent surgical history.     No family history on file.  Social History   Tobacco Use  . Smoking status: Current Some Day Smoker    Years: 25.00    Types: Cigarettes  . Smokeless tobacco: Never Used  Substance Use Topics  . Alcohol use: No  . Drug use: No    Home Medications Prior to Admission medications   Medication Sig Start Date End Date Taking? Authorizing Provider  cyclobenzaprine (FLEXERIL) 10 MG tablet Take 1 tablet (10 mg total) by mouth 2 (two) times daily as needed for up to 20 doses for muscle spasms. 04/14/20  Yes Chariah Bailey, DO  nitrofurantoin, macrocrystal-monohydrate, (MACROBID) 100 MG capsule Take 1 capsule (100 mg total) by mouth 2 (two) times daily. 10/04/17   Renne Crigler, PA-C    Allergies    Patient has no known allergies.  Review of Systems   Review of Systems  Constitutional: Negative for fever and weight loss.  Cardiovascular: Negative for chest pain.  Gastrointestinal: Negative for abdominal pain and bowel incontinence.   Genitourinary: Negative for bladder incontinence, dysuria and pelvic pain.  Musculoskeletal: Positive for back pain.  Neurological: Negative for tingling, weakness, numbness, headaches and paresthesias.    Physical Exam Updated Vital Signs BP 122/86 (BP Location: Left Arm)   Pulse 64   Temp 97.7 F (36.5 C) (Oral)   Resp 16   SpO2 99%   Physical Exam Vitals and nursing note reviewed.  Constitutional:      Appearance: He is well-developed and well-nourished.  HENT:     Head: Normocephalic and atraumatic.  Eyes:     Conjunctiva/sclera: Conjunctivae normal.  Cardiovascular:     Rate and Rhythm: Normal rate and regular rhythm.     Pulses: Normal pulses.     Heart sounds: No murmur heard.   Pulmonary:     Effort: Pulmonary effort is normal. No respiratory distress.     Breath sounds: Normal breath sounds.  Abdominal:     Palpations: Abdomen is soft.     Tenderness: There is no abdominal tenderness.  Musculoskeletal:        General: Tenderness (TTP to paraspinal muscles in lumbar region on the right, No midline spinal pain ) present. No edema. Normal range of motion.     Cervical back: Neck supple.  Skin:    General: Skin is warm and dry.     Capillary Refill: Capillary refill takes less than 2 seconds.  Neurological:     General: No focal deficit present.     Mental  Status: He is alert.     Sensory: No sensory deficit.     Motor: No weakness.  Psychiatric:        Mood and Affect: Mood and affect normal.     ED Results / Procedures / Treatments   Labs (all labs ordered are listed, but only abnormal results are displayed) Labs Reviewed - No data to display  EKG None  Radiology No results found.  Procedures Procedures   Medications Ordered in ED Medications - No data to display  ED Course  I have reviewed the triage vital signs and the nursing notes.  Pertinent labs & imaging results that were available during my care of the patient were reviewed by me  and considered in my medical decision making (see chart for details).    MDM Rules/Calculators/A&P                          [name] is a 60 year old male here with back pain.  Normal vitals.  No fever.  Likely overuse injury.  Manual labor job.  Advil has helped with pain.  Not having much pain anymore upon my evaluation.  No midline spinal pain.  Increased tone to the right sided paraspinal muscles in the lumbar region consistent with a muscle spasm.  No symptoms of cauda equina.  Normal neurological exam.  Normal pulses.  Overall no concern for peripheral arterial disease or dissection.  Recommend continued use of anti-inflammatories such as Motrin or Aleve.  Written for Flexeril.  Discharged in good condition.  Understands return precautions.  This chart was dictated using voice recognition software.  Despite best efforts to proofread,  errors can occur which can change the documentation meaning.    Final Clinical Impression(s) / ED Diagnoses Final diagnoses:  Acute right-sided low back pain without sciatica    Rx / DC Orders ED Discharge Orders         Ordered    cyclobenzaprine (FLEXERIL) 10 MG tablet  2 times daily PRN        04/14/20 0755           Virgina Norfolk, DO 04/14/20 7425

## 2020-04-27 ENCOUNTER — Encounter (HOSPITAL_COMMUNITY): Payer: Self-pay | Admitting: Emergency Medicine

## 2020-04-27 ENCOUNTER — Other Ambulatory Visit: Payer: Self-pay

## 2020-04-27 ENCOUNTER — Inpatient Hospital Stay (HOSPITAL_COMMUNITY): Payer: BC Managed Care – PPO

## 2020-04-27 ENCOUNTER — Inpatient Hospital Stay (HOSPITAL_COMMUNITY)
Admission: EM | Admit: 2020-04-27 | Discharge: 2020-05-01 | DRG: 041 | Disposition: A | Discharge status: Home / Self Care | Payer: BC Managed Care – PPO | Attending: Internal Medicine | Admitting: Internal Medicine

## 2020-04-27 ENCOUNTER — Emergency Department (HOSPITAL_COMMUNITY): Payer: BC Managed Care – PPO

## 2020-04-27 DIAGNOSIS — Z833 Family history of diabetes mellitus: Secondary | ICD-10-CM

## 2020-04-27 DIAGNOSIS — R29704 NIHSS score 4: Secondary | ICD-10-CM | POA: Diagnosis present

## 2020-04-27 DIAGNOSIS — G8194 Hemiplegia, unspecified affecting left nondominant side: Secondary | ICD-10-CM | POA: Diagnosis present

## 2020-04-27 DIAGNOSIS — Z23 Encounter for immunization: Secondary | ICD-10-CM

## 2020-04-27 DIAGNOSIS — I639 Cerebral infarction, unspecified: Secondary | ICD-10-CM | POA: Diagnosis not present

## 2020-04-27 DIAGNOSIS — R29702 NIHSS score 2: Secondary | ICD-10-CM | POA: Diagnosis not present

## 2020-04-27 DIAGNOSIS — E7800 Pure hypercholesterolemia, unspecified: Secondary | ICD-10-CM | POA: Diagnosis not present

## 2020-04-27 DIAGNOSIS — R29810 Facial weakness: Secondary | ICD-10-CM | POA: Diagnosis present

## 2020-04-27 DIAGNOSIS — R29706 NIHSS score 6: Secondary | ICD-10-CM | POA: Diagnosis not present

## 2020-04-27 DIAGNOSIS — Z791 Long term (current) use of non-steroidal anti-inflammatories (NSAID): Secondary | ICD-10-CM | POA: Diagnosis not present

## 2020-04-27 DIAGNOSIS — I10 Essential (primary) hypertension: Secondary | ICD-10-CM | POA: Diagnosis present

## 2020-04-27 DIAGNOSIS — Z720 Tobacco use: Secondary | ICD-10-CM | POA: Diagnosis not present

## 2020-04-27 DIAGNOSIS — E785 Hyperlipidemia, unspecified: Secondary | ICD-10-CM

## 2020-04-27 DIAGNOSIS — R471 Dysarthria and anarthria: Secondary | ICD-10-CM | POA: Diagnosis present

## 2020-04-27 DIAGNOSIS — Z20822 Contact with and (suspected) exposure to covid-19: Secondary | ICD-10-CM | POA: Diagnosis present

## 2020-04-27 DIAGNOSIS — I6340 Cerebral infarction due to embolism of unspecified cerebral artery: Principal | ICD-10-CM | POA: Insufficient documentation

## 2020-04-27 DIAGNOSIS — D72829 Elevated white blood cell count, unspecified: Secondary | ICD-10-CM | POA: Diagnosis present

## 2020-04-27 DIAGNOSIS — F17210 Nicotine dependence, cigarettes, uncomplicated: Secondary | ICD-10-CM | POA: Diagnosis present

## 2020-04-27 DIAGNOSIS — R7303 Prediabetes: Secondary | ICD-10-CM

## 2020-04-27 DIAGNOSIS — Z8249 Family history of ischemic heart disease and other diseases of the circulatory system: Secondary | ICD-10-CM | POA: Diagnosis not present

## 2020-04-27 DIAGNOSIS — I6389 Other cerebral infarction: Secondary | ICD-10-CM | POA: Diagnosis not present

## 2020-04-27 DIAGNOSIS — I63411 Cerebral infarction due to embolism of right middle cerebral artery: Secondary | ICD-10-CM | POA: Diagnosis not present

## 2020-04-27 HISTORY — DX: Other psychoactive substance abuse, uncomplicated: F19.10

## 2020-04-27 LAB — DIFFERENTIAL
Abs Immature Granulocytes: 0.05 10*3/uL (ref 0.00–0.07)
Basophils Absolute: 0 10*3/uL (ref 0.0–0.1)
Basophils Relative: 0 %
Eosinophils Absolute: 0 10*3/uL (ref 0.0–0.5)
Eosinophils Relative: 0 %
Immature Granulocytes: 1 %
Lymphocytes Relative: 25 %
Lymphs Abs: 2.6 10*3/uL (ref 0.7–4.0)
Monocytes Absolute: 0.8 10*3/uL (ref 0.1–1.0)
Monocytes Relative: 7 %
Neutro Abs: 7.2 10*3/uL (ref 1.7–7.7)
Neutrophils Relative %: 67 %

## 2020-04-27 LAB — URINALYSIS, ROUTINE W REFLEX MICROSCOPIC
Bilirubin Urine: NEGATIVE
Glucose, UA: NEGATIVE mg/dL
Ketones, ur: NEGATIVE mg/dL
Nitrite: POSITIVE — AB
Protein, ur: >=300 mg/dL — AB
Specific Gravity, Urine: 1.019 (ref 1.005–1.030)
WBC, UA: >50 WBC/hpf — ABNORMAL HIGH (ref 0–5)
pH: 5 (ref 5.0–8.0)

## 2020-04-27 LAB — RAPID URINE DRUG SCREEN, HOSP PERFORMED
Amphetamines: NOT DETECTED
Barbiturates: NOT DETECTED
Benzodiazepines: NOT DETECTED
Cocaine: NOT DETECTED
Opiates: NOT DETECTED
Tetrahydrocannabinol: NOT DETECTED

## 2020-04-27 LAB — COMPREHENSIVE METABOLIC PANEL
ALT: 22 U/L (ref 0–44)
AST: 24 U/L (ref 15–41)
Albumin: 3.4 g/dL — ABNORMAL LOW (ref 3.5–5.0)
Alkaline Phosphatase: 49 U/L (ref 38–126)
Anion gap: 11 (ref 5–15)
BUN: 9 mg/dL (ref 6–20)
CO2: 22 mmol/L (ref 22–32)
Calcium: 9 mg/dL (ref 8.9–10.3)
Chloride: 107 mmol/L (ref 98–111)
Creatinine, Ser: 0.99 mg/dL (ref 0.61–1.24)
GFR, Estimated: >60 mL/min (ref >60)
Glucose, Bld: 118 mg/dL — ABNORMAL HIGH (ref 70–99)
Potassium: 3.5 mmol/L (ref 3.5–5.1)
Sodium: 140 mmol/L (ref 135–145)
Total Bilirubin: 1.3 mg/dL — ABNORMAL HIGH (ref 0.3–1.2)
Total Protein: 7.2 g/dL (ref 6.5–8.1)

## 2020-04-27 LAB — CBC
HCT: 46.5 % (ref 39.0–52.0)
Hemoglobin: 15.4 g/dL (ref 13.0–17.0)
MCH: 31.2 pg (ref 26.0–34.0)
MCHC: 33.1 g/dL (ref 30.0–36.0)
MCV: 94.1 fL (ref 80.0–100.0)
Platelets: 212 10*3/uL (ref 150–400)
RBC: 4.94 MIL/uL (ref 4.22–5.81)
RDW: 11.4 % — ABNORMAL LOW (ref 11.5–15.5)
WBC: 10.7 10*3/uL — ABNORMAL HIGH (ref 4.0–10.5)
nRBC: 0 % (ref 0.0–0.2)

## 2020-04-27 LAB — APTT: aPTT: 30 seconds (ref 24–36)

## 2020-04-27 LAB — PROTIME-INR
INR: 1 (ref 0.8–1.2)
Prothrombin Time: 12.7 seconds (ref 11.4–15.2)

## 2020-04-27 LAB — SARS CORONAVIRUS 2 (TAT 6-24 HRS): SARS Coronavirus 2: NEGATIVE

## 2020-04-27 MED ORDER — ENOXAPARIN SODIUM 40 MG/0.4ML ~~LOC~~ SOLN
40.0000 mg | SUBCUTANEOUS | Status: DC
  Administered 2020-04-27 – 2020-04-30 (×4): 40 mg via SUBCUTANEOUS
  Filled 2020-04-27 (×5): qty 0.4

## 2020-04-27 MED ORDER — ACETAMINOPHEN 160 MG/5ML PO SOLN: 650.0000 mg | ORAL | Status: DC | PRN

## 2020-04-27 MED ORDER — SENNOSIDES-DOCUSATE SODIUM 8.6-50 MG PO TABS: 1.0000 | ORAL_TABLET | Freq: Every evening | ORAL | Status: DC | PRN

## 2020-04-27 MED ORDER — ASPIRIN 325 MG PO TABS
325.0000 mg | ORAL_TABLET | Freq: Every day | ORAL | Status: DC
  Administered 2020-04-27: 325 mg via ORAL
  Filled 2020-04-27: qty 1

## 2020-04-27 MED ORDER — ACETAMINOPHEN 650 MG RE SUPP: 650.0000 mg | RECTAL | Status: DC | PRN

## 2020-04-27 MED ORDER — ASPIRIN 300 MG RE SUPP: 300.0000 mg | Freq: Every day | RECTAL | Status: DC

## 2020-04-27 MED ORDER — IOHEXOL 350 MG/ML SOLN
75.0000 mL | Freq: Once | INTRAVENOUS | Status: AC | PRN
  Administered 2020-04-27: 75 mL via INTRAVENOUS

## 2020-04-27 MED ORDER — ONDANSETRON HCL 4 MG/2ML IJ SOLN: 4.0000 mg | Freq: Four times a day (QID) | INTRAMUSCULAR | Status: DC | PRN

## 2020-04-27 MED ORDER — STROKE: EARLY STAGES OF RECOVERY BOOK
Freq: Once | Status: DC
  Filled 2020-04-27: qty 1

## 2020-04-27 MED ORDER — ATORVASTATIN CALCIUM 40 MG PO TABS
40.0000 mg | ORAL_TABLET | Freq: Every day | ORAL | Status: DC
  Administered 2020-04-27 – 2020-05-01 (×5): 40 mg via ORAL
  Filled 2020-04-27 (×5): qty 1

## 2020-04-27 MED ORDER — SODIUM CHLORIDE 0.9 % IV SOLN: INTRAVENOUS | Status: DC

## 2020-04-27 MED ORDER — NICOTINE 14 MG/24HR TD PT24
14.0000 mg | MEDICATED_PATCH | Freq: Every day | TRANSDERMAL | Status: DC
  Administered 2020-04-29 – 2020-05-01 (×3): 14 mg via TRANSDERMAL
  Filled 2020-04-27 (×3): qty 1

## 2020-04-27 MED ORDER — ACETAMINOPHEN 325 MG PO TABS: 650.0000 mg | ORAL_TABLET | ORAL | Status: DC | PRN

## 2020-04-27 NOTE — ED Notes (Addendum)
3W Nurse unavailable for report, call back number given.

## 2020-04-27 NOTE — Progress Notes (Signed)
Pt w  04/27/20 1600  PT Visit Information  Last PT Received On 04/27/20  Assistance Needed +1  History of Present Illness 60 yo male with onset of L side weakness sustained a fall climbing up to change smoke detector battery on 2/5, then on 2/6 came to ED with significant changes in his L side facially and in L extremities.  On imaging his notes were that the infarction was in R anterior limb of internal capsule.  Pt has memory changes, coordination changes and impulsive behavior.  PMHx:  substance abuse, HTN, HLD, tobacco use,  Precautions  Precautions Fall  Precaution Comments impulsive standing and walking away from PT  Restrictions  Weight Bearing Restrictions No  Home Living  Family/patient expects to be discharged to: Private residence  Living Arrangements Alone  Available Help at Discharge Family;Friend(s);Available PRN/intermittently  Type of Home House  Home Access Stairs to enter  Entrance Stairs-Number of Steps 4  Entrance Stairs-Rails Can reach both  Home Layout One level  Information systems manager  Home Equipment None  Additional Comments has been working and walks with no AD normally  Prior Function  Level of Independence Independent  Communication  Communication Expressive difficulties  Pain Assessment  Pain Assessment No/denies pain  Cognition  Arousal/Alertness Awake/alert  Behavior During Therapy WFL for tasks assessed/performed  Overall Cognitive Status Impaired/Different from baseline  Area of Impairment Memory;Following commands;Safety/judgement;Awareness;Problem solving;Orientation;Attention  Orientation Level Situation  Current Attention Level Selective  Memory Decreased recall of precautions;Decreased short-term memory  Following Commands Follows one step commands inconsistently;Follows one step commands with increased time  Safety/Judgement Decreased awareness of safety;Decreased awareness of deficits  Awareness Intellectual  Problem Solving Slow  processing;Requires verbal cues;Requires tactile cues  General Comments Pt is unaware of his safety with walking in hosp, stood suddenly to walk with no AD  Upper Extremity Assessment  Upper Extremity Assessment Overall WFL for tasks assessed  Lower Extremity Assessment  Lower Extremity Assessment LLE deficits/detail  LLE Deficits / Details reduced strength and mild incoordination  LLE Coordination decreased gross motor  Cervical / Trunk Assessment  Cervical / Trunk Assessment Normal  Bed Mobility  Overal bed mobility Needs Assistance  Bed Mobility Supine to Sit;Sit to Supine  Supine to sit Supervision;Min guard  Sit to supine Min guard  General bed mobility comments pt is up to side of bed quickly due to needing to walk to BR, but did not stop when PT asked him to wait due to BP line on his arm  Transfers  Overall transfer level Needs assistance  Equipment used 1 person hand held assist  General transfer comment cues for safety and proximity to bed before sitting  Ambulation/Gait  Ambulation/Gait assistance Min guard;Min assist  Gait Distance (Feet) 80 Feet  Assistive device 1 person hand held assist  Gait Pattern/deviations Step-through pattern;Decreased stride length;Wide base of support;Decreased weight shift to left (circumducting on LLE)  General Gait Details pt walks with LLE compensating from CVA, circumducting and leaning to right side with everystep  Gait velocity reduced  Modified Rankin (Stroke Patients Only)  Pre-Morbid Rankin Score 0  Modified Rankin 3  Balance  Overall balance assessment Needs assistance;History of Falls  Sitting-balance support Feet supported  Sitting balance-Leahy Scale Fair  Postural control Right lateral lean  Standing balance support Single extremity supported;During functional activity  Standing balance-Leahy Scale Good  General Comments  General comments (skin integrity, edema, etc.) pt is up to walk with help and unsafe to take steps  alone,  despite his willingness to do so.  Encouraged him to ask for help and focus on safety  PT - End of Session  Equipment Utilized During Treatment Gait belt  Activity Tolerance Treatment limited secondary to medical complications (Comment)  Patient left in bed;with call bell/phone within reach;with nursing/sitter in room  Nurse Communication Mobility status  PT Assessment  PT Recommendation/Assessment Patient needs continued PT services  PT Visit Diagnosis Unsteadiness on feet (R26.81);Muscle weakness (generalized) (M62.81);Difficulty in walking, not elsewhere classified (R26.2);Hemiplegia and hemiparesis  Hemiplegia - Right/Left Left  Hemiplegia - dominant/non-dominant Non-dominant  Hemiplegia - caused by Cerebral infarction  PT Problem List Decreased strength;Decreased range of motion;Decreased activity tolerance;Decreased balance;Decreased mobility;Decreased coordination;Decreased knowledge of use of DME;Decreased safety awareness;Cardiopulmonary status limiting activity  Barriers to Discharge Inaccessible home environment;Decreased caregiver support  Barriers to Discharge Comments home with stairs and no help  PT Plan  PT Frequency (ACUTE ONLY) Min 4X/week  PT Treatment/Interventions (ACUTE ONLY) DME instruction;Gait training;Stair training;Functional mobility training;Therapeutic activities;Therapeutic exercise;Balance training;Neuromuscular re-education;Patient/family education  AM-PAC PT "6 Clicks" Mobility Outcome Measure (Version 2)  Help needed turning from your back to your side while in a flat bed without using bedrails? 3  Help needed moving from lying on your back to sitting on the side of a flat bed without using bedrails? 3  Help needed moving to and from a bed to a chair (including a wheelchair)? 3  Help needed standing up from a chair using your arms (e.g., wheelchair or bedside chair)? 3  Help needed to walk in hospital room? 3  Help needed climbing 3-5 steps with a  railing?  1  6 Click Score 16  Consider Recommendation of Discharge To: Home with [facility]  PT Recommendation  Recommendations for Other Services Rehab consult  Follow Up Recommendations CIR  PT equipment None recommended by PT  Individuals Consulted  Consulted and Agree with Results and Recommendations Patient unable/family or caregiver not available  Acute Rehab PT Goals  Patient Stated Goal to get to the bathroom  PT Goal Formulation With patient  Potential to Achieve Goals Good  PT Time Calculation  PT Start Time (ACUTE ONLY) 1555  PT Stop Time (ACUTE ONLY) 1627  PT Time Calculation (min) (ACUTE ONLY) 32 min  PT General Charges  $$ ACUTE PT VISIT 1 Visit  PT Evaluation  $PT Eval Moderate Complexity 1 Mod  PT Treatments  $Gait Training 8-22 mins  Written Expression  Dominant Hand Right    Samul Dada, PT MS Acute Rehab Dept. Number: [facility] [phone] and [facility] [phone] as seen for mobility on hallway with no AD to assess the impact of his L side changes by his stroke.  He is walking with circumduction gait, impulsively moving and listing back and to R side.  Pt is having incoordination on L side along with reduced strength through entire LLE.  Pt is motivated but noted that his memory has changed:  forgot that he was working, could not remember how much help he has for home.  Follow for goals of acute PT and focus on strength of L side, balance skills and gait correction of L side hemiparesis.

## 2020-04-27 NOTE — ED Triage Notes (Signed)
Pt arrives with family member who states LSN was Friday evening, states pts mother talked to him Saturday and his speech sounded off. Pt states he woke up yesterday morning feeling off balance. Family member went over today when noone could get in touch with patient yesterday and pt was slurring his words, having weakness on L side and having L sided facial droop. Pt a/ox4 to orientation questions. Weakness present to L side with present L sided facial droop, denies visual changes.

## 2020-04-27 NOTE — H&P (Signed)
History and Physical    Timothy Baker OFB:[id number] DOB: 1960-11-14 DOA: 04/27/2020  PCP: Renford Dills, MD Consultants:  None Patient coming from:  Home - lives alone; NOK: [place], Phariss, [phone]  Chief Complaint:  Stroke-like symptoms  HPI: Timothy Baker is a 60 y.o. male with no significant medical history presenting with stroke-like symptoms.  He reports that he felt off balance at work Friday and this continued all weekend.  +dysphagia, +dysarthria.  L-sided weakness.  His sister reports that his job called her this morning when he did not show up (very unusual).  He was dragging his foot, spaced out, difficult speaking, severe slurring, mouth twisted.  He does not see a doctor regularly.  He is a recovering addict x 3 years.    ED Course:  CVA.  No PMH.  3 days of dysarthria, L-sided weakness.  Did not show up for work so sister went to check on him.  CT head with acute infarct of R internal capsule.  Review of Systems: As per HPI; otherwise review of systems reviewed and negative.   Ambulatory Status:  Ambulates without assistance  COVID Vaccine Status:  Complete  Past Medical History:  Diagnosis Date  . Polysubstance abuse (HCC)    quit in 2019 - used ETOH, crack cocaine    Past Surgical History:  Procedure Laterality Date  . genital surgery      Social History   Socioeconomic History  . Marital status: Single    Spouse name: Not on file  . Number of children: Not on file  . Years of education: Not on file  . Highest education level: Not on file  Occupational History  . Not on file  Tobacco Use  . Smoking status: Current Some Day Smoker    Packs/day: 0.50    Years: 45.00    Pack years: 22.50    Types: Cigarettes  . Smokeless tobacco: Never Used  Substance and Sexual Activity  . Alcohol use: Not Currently    Comment: quit 4 years ago (2018)  . Drug use: Not Currently    Types: "Crack" cocaine    Comment: quit 2018-2019  . Sexual  activity: Not on file  Other Topics Concern  . Not on file  Social History Narrative  . Not on file   Social Determinants of Health   Financial Resource Strain: Not on file  Food Insecurity: Not on file  Transportation Needs: Not on file  Physical Activity: Not on file  Stress: Not on file  Social Connections: Not on file  Intimate Partner Violence: Not on file    No Known Allergies  Family History  Problem Relation Age of Onset  . Stroke Neg Hx     Prior to Admission medications   Medication Sig Start Date End Date Taking? Authorizing Provider  cyclobenzaprine (FLEXERIL) 10 MG tablet Take 1 tablet (10 mg total) by mouth 2 (two) times daily as needed for up to 20 doses for muscle spasms. 04/14/20   Curatolo, Adam, DO  nitrofurantoin, macrocrystal-monohydrate, (MACROBID) 100 MG capsule Take 1 capsule (100 mg total) by mouth 2 (two) times daily. 10/04/17   Renne Crigler, PA-C    Physical Exam: Vitals:   04/27/20 1010 04/27/20 1201 04/27/20 1429  BP: 128/86 134/80 (!) 145/67  Pulse: (!) 110 93 90  Resp: 16 16 18   Temp: 99.4 F (37.4 C)  98.3 F (36.8 C)  TempSrc:   Oral  SpO2: 100% 100% 98%     .  General:  Appears calm and comfortable and is in NAD . Eyes:  Poor eye contact, normal lids, iris, mildly injected B conjunctivae . ENT:  grossly normal hearing, lips & tongue, mmm; poor dentition . Neck:  no LAD, masses or thyromegaly . Cardiovascular:  RRR, no m/r/g. No LE edema.  Marland Kitchen Respiratory:   CTA bilaterally with no wheezes/rales/rhonchi.  Normal respiratory effort. . Abdomen:  soft, NT, ND, NABS . Skin:  no rash or induration seen on limited exam . Musculoskeletal:  4/5 LUE and LLE strength . Psychiatric:  blunted mood and affect, speech sparse but generally appropriate, AOx3 . Neurologic:  L facial droop, moves all extremities in coordinated fashion but with decreased strength on L, sensation intact other than L face    Radiological Exams on  Admission: Independently reviewed - see discussion in A/P where applicable  CT HEAD WO CONTRAST  Result Date: 04/27/2020 CLINICAL DATA:  Left-sided facial droop and slurred speech EXAM: CT HEAD WITHOUT CONTRAST TECHNIQUE: Contiguous axial images were obtained from the base of the skull through the vertex without intravenous contrast. COMPARISON:  None. FINDINGS: Brain: Ventricles and sulci are normal in size and configuration. There is no appreciable intracranial mass, hemorrhage, extra-axial fluid collection, or midline shift. There is decreased attenuation involving the anterior limb of the right internal capsule as well as the immediately adjacent anterior, inferior right centrum semiovale, suspicious for recent and likely acute infarct in this region. Elsewhere brain parenchyma appears unremarkable. Vascular: No hyperdense vessel. There is calcification in each carotid siphon region. Skull: Bony calvarium appears intact. Sinuses/Orbits: Visualized paranasal sinuses are clear. Orbits appear symmetric bilaterally. Other: Mastoid air cells are clear. IMPRESSION: Recent appearing and suspected acute infarct involving a portion of the anterior limb of the right internal capsule and immediately adjacent right centrum semiovale. Elsewhere brain parenchyma appears unremarkable. No mass or hemorrhage demonstrable. There are foci of arterial vascular calcification. Electronically Signed   By: Bretta Bang III M.D.   On: 04/27/2020 11:30    EKG: Independently reviewed.   Sinus tachycardia with rate 105; NSCSLT   Labs on Admission: I have personally reviewed the available labs and imaging studies at the time of the admission.  Pertinent labs:   Glucose 118 Bili 1.3 WBC 10.7 INR 1.0 COVID pending   Assessment/Plan Principal Problem:   Acute CVA (cerebrovascular accident) (HCC) Active Problems:   Tobacco dependence due to cigarettes   Acute CVA -L hemiparesis with dysarthria and L facial  droop, highly suggestive of CVA -ABCD2 score is 6 -Symptom onset appears to have been Friday, so well out of the tPA window -Aspirin has been given to reduce stroke mortality and decrease morbidity -CT confirms acute CVA -Will admit for further CVA evaluation -Telemetry monitoring -MRI -CTA head and neck -Risk stratification with FLP, A1c; will also check TSH and UDS -Patient will need DAPT for 21 days when ABCD2 score is at least 4 and NIH score is 3 or less, and then can transition to monotherapy with a single antiplatelet agent.  Will defer to neurology for now. -Consider thrombectomy if there is persistent disabling neurologic deficit associated with a vascular cut-off -Neurology consult -PT/OT/ST/Nutrition Consults -May need CIR - lives alone  HTN -Allow permissive HTN for now -Treat BP only if >220/120, and then with goal of 15% reduction -Thiazide diuretics are recommended as first-line therapeutic agents vs. ACE/ARB for diabetic patients   HLD -Check FLP -Start empiric Lipitor 40 mg daily   H/o polysubstance abuse -ETOH  and crack cocaine dependence remotely -Quit using 3-4 years ago  Tobacco dependence -Encourage cessation.   -This was discussed with the patient and should be reviewed on an ongoing basis.   -Patch ordered   Note: This patient has been tested and is pending for the novel coronavirus COVID-19. He has been fully vaccinated against COVID-19.    DVT prophylaxis:  Lovenox  Code Status: Full - confirmed with patient Family Communication: None present; I spoke with his sister at the time of admission. Disposition Plan:  The patient is from: home  Anticipated d/c is to: be determined  Anticipated d/c date will depend on clinical response to treatment, likely 2-3 days  Patient is currently: acutely ill Consults called: Neurology; PT/OT/ST/Nutrition; TOC team Admission status: Admit - It is my clinical opinion that admission to INPATIENT is reasonable and  necessary because of the expectation that this patient will require hospital care that crosses at least 2 midnights to treat this condition based on the medical complexity of the problems presented.  Given the aforementioned information, the predictability of an adverse outcome is felt to be significant.   Jonah Blue MD [facility]   How to contact the [facility] Attending or Consulting provider 7A - 7P or covering provider during after hours 7P -7A, for this patient?  1. Check the care team in [facility] and look for a) attending/consulting TRH provider listed and b) the [facility] team listed 2. Log into [email or web address] and use [facility]'s universal password to access. If you do not have the password, please contact the hospital operator. 3. Locate the [facility] provider you are looking for under [facility] and page to a number that you can be directly reached. 4. If you still have difficulty reaching the provider, please page the [facility] (Director on Call) for the Hospitalists listed on amion for assistance.   04/27/2020, 4:13 PM

## 2020-04-27 NOTE — ED Notes (Signed)
Attempted to call report, [phone]; stated room assignment is not approved at this time, and unable to take report.

## 2020-04-27 NOTE — ED Provider Notes (Signed)
[facility] EMERGENCY DEPARTMENT Provider Note   CSN: [id number] Arrival date & time: 04/27/20  4193     History Chief Complaint  Patient presents with  . Stroke Symptoms    Timothy Baker is a 60 y.o. male with no significant past medical history who presents to the ED accompanied by family member with slurred speech, disequilibrium, left-sided facial droop, and left-sided weakness.  On my examination, patient is accompanied by his sister, Dewayne Hatch.  She states that his coworkers noticed mild dysarthria on Friday 04/24/2020, but he did not come to the ED for evaluation.  They have family gatherings every Saturday and Sunday and he did not show.  She states that this was very unusual for him.  She then received a phone call from his employer this morning stating that he no-showed which he has never done before.  Upon going to his home, she noticed that he was dragging his left leg and his face looked "twisted".  She was concerned for stroke which prompted her to bring him to the ED for evaluation.  Patient admits that he has never been seen by a primary care provider.  He states that he has felt okay, aside from current headache.  He does acknowledge that he has facial droop and dysarthria.  He also endorses weakness involving his left arm and left leg.  He denies any sensory involvement.  He is alert and oriented x3 and his only area was citing McCain as the president.  He denies any recent illness or infection, chest pain, shortness of breath, room spinning dizziness, numbness, blurred vision, or urinary symptoms.  No illicit drug use in over three years.    Last known normal was 04/24/2020.    I reviewed patient's medical record and he was recently evaluated in the ED for lumbar strain and discharged home with recommendations of NSAIDs and Flexeril as needed.  Patient states that he never filled the prescription for Flexeril.  HPI     History reviewed. No pertinent past  medical history.  There are no problems to display for this patient.   History reviewed. No pertinent surgical history.     No family history on file.  Social History   Tobacco Use  . Smoking status: Current Some Day Smoker    Years: 25.00    Types: Cigarettes  . Smokeless tobacco: Never Used  Substance Use Topics  . Alcohol use: No  . Drug use: No    Home Medications Prior to Admission medications   Medication Sig Start Date End Date Taking? Authorizing Provider  cyclobenzaprine (FLEXERIL) 10 MG tablet Take 1 tablet (10 mg total) by mouth 2 (two) times daily as needed for up to 20 doses for muscle spasms. 04/14/20   Curatolo, Adam, DO  nitrofurantoin, macrocrystal-monohydrate, (MACROBID) 100 MG capsule Take 1 capsule (100 mg total) by mouth 2 (two) times daily. 10/04/17   Renne Crigler, PA-C    Allergies    Patient has no known allergies.  Review of Systems   Review of Systems  All other systems reviewed and are negative.   Physical Exam Updated Vital Signs BP (!) 145/67 (BP Location: Right Arm)   Pulse 90   Temp 98.3 F (36.8 C) (Oral)   Resp 18   SpO2 98%   Physical Exam Vitals and nursing note reviewed. Exam conducted with a chaperone present.  Constitutional:      General: He is not in acute distress.    Appearance: He  is not toxic-appearing.  HENT:     Head: Normocephalic and atraumatic.     Mouth/Throat:     Comments: Left-sided facial droop, asymmetric smile.  Eyebrows rise and forehead wrinkles appropriately. Eyes:     General: No scleral icterus.    Extraocular Movements: Extraocular movements intact.     Conjunctiva/sclera: Conjunctivae normal.     Pupils: Pupils are equal, round, and reactive to light.     Comments: No significant nystagmus.  PERRLA.  EOM intact.  Peripheral visual fields intact.  Cardiovascular:     Rate and Rhythm: Normal rate and regular rhythm.     Pulses: Normal pulses.  Pulmonary:     Effort: Pulmonary effort is  normal. No respiratory distress.  Musculoskeletal:     Cervical back: Normal range of motion. No rigidity.  Skin:    General: Skin is dry.  Neurological:     Mental Status: He is alert and oriented to person, place, and time.     GCS: GCS eye subscore is 4. GCS verbal subscore is 5. GCS motor subscore is 6.     Motor: Weakness present.     Comments: Facial droop.  Asymmetric smile.  Alert and oriented x3, error with naming president.  Sensation is grossly intact throughout.  2 out of 5 left-sided grip strength, 3 out of 5 left-sided leg strength.  Patient is able to hold left arm up for several seconds, but then it begins to drift.  Peripheral pulses intact and symmetric.  Gait exam deferred.  Psychiatric:        Mood and Affect: Mood normal.        Behavior: Behavior normal.        Thought Content: Thought content normal.     ED Results / Procedures / Treatments   Labs (all labs ordered are listed, but only abnormal results are displayed) Labs Reviewed  CBC - Abnormal; Notable for the following components:      Result Value   WBC 10.7 (*)    RDW 11.4 (*)    All other components within normal limits  COMPREHENSIVE METABOLIC PANEL - Abnormal; Notable for the following components:   Glucose, Bld 118 (*)    Albumin 3.4 (*)    Total Bilirubin 1.3 (*)    All other components within normal limits  SARS CORONAVIRUS 2 (TAT 6-24 HRS)  PROTIME-INR  APTT  DIFFERENTIAL  RAPID URINE DRUG SCREEN, HOSP PERFORMED  URINALYSIS, ROUTINE W REFLEX MICROSCOPIC  LIPID PANEL  HEMOGLOBIN A1C  CBG MONITORING, ED    EKG EKG Interpretation  Date/Time:  Monday April 27 2020 10:07:11 EST Ventricular Rate:  105 PR Interval:  134 QRS Duration: 76 QT Interval:  320 QTC Calculation: 422 R Axis:   63 Text Interpretation: Sinus tachycardia Otherwise normal ECG Otherwise no significant change Confirmed by Melene Plan [phone]) on 04/27/2020 1:30:58 PM   Radiology CT HEAD WO CONTRAST  Result Date:  04/27/2020 CLINICAL DATA:  Left-sided facial droop and slurred speech EXAM: CT HEAD WITHOUT CONTRAST TECHNIQUE: Contiguous axial images were obtained from the base of the skull through the vertex without intravenous contrast. COMPARISON:  None. FINDINGS: Brain: Ventricles and sulci are normal in size and configuration. There is no appreciable intracranial mass, hemorrhage, extra-axial fluid collection, or midline shift. There is decreased attenuation involving the anterior limb of the right internal capsule as well as the immediately adjacent anterior, inferior right centrum semiovale, suspicious for recent and likely acute infarct in this region. Elsewhere brain  parenchyma appears unremarkable. Vascular: No hyperdense vessel. There is calcification in each carotid siphon region. Skull: Bony calvarium appears intact. Sinuses/Orbits: Visualized paranasal sinuses are clear. Orbits appear symmetric bilaterally. Other: Mastoid air cells are clear. IMPRESSION: Recent appearing and suspected acute infarct involving a portion of the anterior limb of the right internal capsule and immediately adjacent right centrum semiovale. Elsewhere brain parenchyma appears unremarkable. No mass or hemorrhage demonstrable. There are foci of arterial vascular calcification. Electronically Signed   By: Bretta Bang III M.D.   On: 04/27/2020 11:30    Procedures Procedures   Medications Ordered in ED Medications - No data to display  ED Course  I have reviewed the triage vital signs and the nursing notes.  Pertinent labs & imaging results that were available during my care of the patient were reviewed by me and considered in my medical decision making (see chart for details).  Clinical Course as of 04/27/20 1533  Mon Apr 27, 2020  1434 I spoke with Dr. Otelia Limes who is tentatively recommending CTA head and neck as well as MRI brain WO but would like to first see and evaluate the patient.  Plan for hospitalist admission  given that patient is not TPA candidate. [GG]  1532 I spoke with Dr. Ophelia Charter who will see and admit patient.  [GG]    Clinical Course User Index [GG] Lorelee New, PA-C   MDM Rules/Calculators/A&P                          KEYDEN PAVLOV was evaluated in Emergency Department on 04/27/2020 for the symptoms described in the history of present illness. He was evaluated in the context of the global COVID-19 pandemic, which necessitated consideration that the patient might be at risk for infection with the SARS-CoV-2 virus that causes COVID-19. Institutional protocols and algorithms that pertain to the evaluation of patients at risk for COVID-19 are in a state of rapid change based on information released by regulatory bodies including the CDC and federal and state organizations. These policies and algorithms were followed during the patient's care in the ED.  I personally reviewed patient's medical chart and all notes from triage and staff during today's encounter. I have also ordered and reviewed all labs and imaging that I felt to be medically necessary in the evaluation of this patient's complaints and with consideration of their with their physical exam. If needed, translation services were available and utilized.   CT head noncontrast is obtained and personally reviewed which appears to demonstrate a recent appearing, suspect acute infarct involving anterior limb of right internal capsule.  These findings are consistent with patient's physical exam findings concerning for stroke.    I spoke with Dr. Otelia Limes who is tentatively recommending CTA head and neck as well as MRI brain WO but would like to first see and evaluate the patient.  Plan for hospitalist admission given that patient is not TPA candidate.  I spoke with Dr. Ophelia Charter who will admit patient.  I informed patient and RN that she would like to see him before he is taken to CT and MRI.    Final Clinical Impression(s) / ED  Diagnoses Final diagnoses:  Facial droop    Rx / DC Orders ED Discharge Orders    None       Lorelee New, PA-C 04/27/20 1533    Melene Plan, DO 04/28/20 0700

## 2020-04-27 NOTE — Consult Note (Signed)
Neurology Consultation  Reason for Consult: CT head: suspected acute infarct  Referring Physician: G. Green, PA  CC: left-mouth droop, left-sided weakness, dysarthria, and disequilibrium   History is obtained from: patient, EDP, chart review  HPI: Timothy Baker is a 60 y.o. male with a history of tobacco use and self-reported borderline hypertension who presented to the ED 04/27/20 with complaints of 3 days of slurred speech, left-sided weakness, left facial droop, and unsteady gait. He claims he went to work Friday at 0600 and coworkers noted that he was not "acting right" and making jokes like usual. Throughout the day, coworkers expressed concern for progressive slurred speech but patient did not seek medical care at that time. Over Friday and Saturday he claims he developed left-sided weakness and had been stumbling and dragging his left foot at home. Today, his boss called his sister concerned that he did not show up for work and this is abnormal for him. He was found at home by his sister dragging his left foot with ambulation and with acute left-sided facial droop so she brought him in to the ED for stroke evaluation.  A CT head was obtained on arrival to the ED revealing a recent acute/subacute infarct involving a portion of the anterior limb of the right internal capsule and adjacent white matter. Neurology was consulted for further evaluation and stroke work-up. In addition to the weakness, the patient endorses a frontal headache with 10/10 severity; onset was this morning but patient attributes this headache to a "bad tooth".  LKW: 04/24/2020 06:00 tpa given?: no, outside of time window.   ROS: A 14 point ROS was performed and is negative except as noted in the HPI.  Past Medical history: Patient claims he has borderline hypertension but does not take medications at home  Past Family History: Mother: type 2 diabetes mellitus, hypertension Father: hypertension, lung  cancer  Social History:   reports that he has been smoking cigarettes. He has smoked for the past 25.00 years. He has never used smokeless tobacco. He reports that he does not drink alcohol and does not use drugs.  Medications Denies taking any medications at home  Exam: Current vital signs: BP (!) 145/67 (BP Location: Right Arm)   Pulse 90   Temp 98.3 F (36.8 C) (Oral)   Resp 18   SpO2 98%  Vital signs in last 24 hours: Temp:  [98.3 F (36.8 C)-99.4 F (37.4 C)] 98.3 F (36.8 C) (02/07 1429) Pulse Rate:  [90-110] 90 (02/07 1429) Resp:  [16-18] 18 (02/07 1429) BP: (128-145)/(67-86) 145/67 (02/07 1429) SpO2:  [98 %-100 %] 98 % (02/07 1429)  GENERAL: Laying in bed with cover over his head, responds quickly and appropriately to voice. In no acute distress. HEAD: Normocephalic and atraumatic, dry mm.  EENT: Corneal arcus present bilaterally, normal conjunctiva. No OP obstruction noted.  LUNGS - Normal respiratory effort. Symmetric chest rise with inspiration.  CV - Extremities warm, without edema, well perfused. ABDOMEN - Soft, non-tender, non-distended Ext: without obvious deformity  NEURO:  Mental Status: Awake, alert, oriented to self, age, month, year, place, and situation. Patient is able to give supporting history of present illness with some difficulty due to moderate dysarthria. Most communication is understandable but some verbalization from patient is unintelligible. Speech is without aphasia. Comprehension, naming, and repetition intact. Cranial Nerves:  II: PERRL 3 mm/brisk. Visual fields full.  III, IV, VI: EOMI without ptosis. Required some repetition/coaching for full left lateral gaze; partial gaze palsy noted  to the right.   V: Sensation is intact to light touch and symmetrical to face.   VII: Smile is asymmetrical with significant left mouth and nasolabial fold paresis. Able to puff cheeks and raise eyebrows.  VIII: Hearing intact to voice IX, X: Phonation  normal.  XI: Normal sternocleidomastoid and trapezius muscle strength. XII: Tongue protrudes midline without fasciculations.   Motor:  Left upper extremity and left lower extremity 4/5 strength, right upper and lower extremities 5/5 strength. Drift of left upper and lower extremities without drift to bed. Right upper and lower extremities without pronator drift. Tone is normal. Bulk is normal.  Sensation: Sensation to light touch intact and symmetric bilaterally in all four extremities. 2-point discrimination intact. During assessment of sensation to light touch, patient inconsistently attends to stimuli on the left upper extremity but other times correctly identifies the presence of stimuli (denies any decreased/impaired sensation throughout). Patient regularly opens eyes to view examiner during assessment and required redirecting.  Coordination: FTN intact bilaterally. HKS intact bilaterally.  DTRs: 2+ bilateral biceps, brachioradialis, and patellar reflexes. Gait- Deferred  NIHSS: 1a Level of Conscious.: 0 1b LOC Questions: 0 1c LOC Commands: 0 2 Best Gaze: 1; partial gaze palsy to the right, unable to fully attend to the left horizonally; overcomes with persistent coaching 3 Visual: 0 4 Facial Palsy: 2 5a Motor Arm - left: 1 5b Motor Arm - Right: 0 6a Motor Leg - Left: 1 6b Motor Leg - Right: 0 7 Limb Ataxia: 0 8 Sensory: 0 9 Best Language: 0 10 Dysarthria: 1 11 Extinct. and Inatten.: 0 TOTAL: 6  Labs I have reviewed labs in epic and the results pertinent to this consultation are: CBC    Component Value Date/Time   WBC 10.7 (H) 04/27/2020 1020   RBC 4.94 04/27/2020 1020   HGB 15.4 04/27/2020 1020   HCT 46.5 04/27/2020 1020   PLT 212 04/27/2020 1020   MCV 94.1 04/27/2020 1020   MCH 31.2 04/27/2020 1020   MCHC 33.1 04/27/2020 1020   RDW 11.4 (L) 04/27/2020 1020   LYMPHSABS 2.6 04/27/2020 1020   MONOABS 0.8 04/27/2020 1020   EOSABS 0.0 04/27/2020 1020   BASOSABS 0.0  04/27/2020 1020   CMP     Component Value Date/Time   NA 140 04/27/2020 1020   K 3.5 04/27/2020 1020   CL 107 04/27/2020 1020   CO2 22 04/27/2020 1020   GLUCOSE 118 (H) 04/27/2020 1020   BUN 9 04/27/2020 1020   CREATININE 0.99 04/27/2020 1020   CALCIUM 9.0 04/27/2020 1020   PROT 7.2 04/27/2020 1020   ALBUMIN 3.4 (L) 04/27/2020 1020   AST 24 04/27/2020 1020   ALT 22 04/27/2020 1020   ALKPHOS 49 04/27/2020 1020   BILITOT 1.3 (H) 04/27/2020 1020   GFRNONAA >60 04/27/2020 1020   GFRAA >60 10/04/2017 1401   Lipid Panel  No results found for: CHOL, TRIG, HDL, CHOLHDL, VLDL, LDLCALC, LDLDIRECT   Imaging I have reviewed the images obtained: CT-scan of the brain IMPRESSION:  Recent appearing and suspected acute infarct involving a portion of the anterior limb of the right internal capsule and immediately adjacent right centrum semiovale. Elsewhere brain parenchyma appears unremarkable. No mass or hemorrhage demonstrable. There are foci of arterial vascular calcification.  MRI examination of the brain: pending  Assessment: 60 year old male presents with left-sided weakness, left facial droop, and dysarthria for approximately 3 days. Only known medical history is cigarette smoking and reported borderline hypertension.  - CT head on  arrival with evidence of an acute/subacute infarct in a portion of the anterior limb of the right internal capsule and adjacent white matter.  - Neurology exam findings are congruent with the location of the stroke seen on CT, but also suggestive of right parietal lobe involvement.  - DDx for etiology: Likely chronic hypertensive small vessel disease versus atherosclerotic versus cardioembolic. - Further stroke work-up pending.   Recommendations: - HgbA1c, fasting lipid panel; consider atorvastatin for LDL > 70.  - MRI of the brain without contrast - CTA head and neck for vessel imaging - Frequent neuro checks - Echocardiogram - Prophylactic therapy-  Antiplatelet med: Aspirin - dose 81 mg PO or 300mg  PR - Blood pressure goal: normotensive  - Risk factor modification - Telemetry monitoring - PT consult, OT consult, Speech consult - Stroke team to follow  , AGAC-NP [facility] Pager: [phone]  I have seen and examined the patient. I have formulated the assessment and recommendations. 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 Exam findings are congruent with the location of the stroke. Stroke risk factors are smoking and probable chronic HTN. Recommendations as above.  Electronically signed: Dr. 67

## 2020-04-27 NOTE — ED Notes (Signed)
Patient transported to MRI,

## 2020-04-27 NOTE — Progress Notes (Deleted)
Pt was seen for mobility on hallway with no AD to assess the impact of his L side changes by his stroke.  He is walking with circumduction gait, impulsively moving and listing back and to R side.  Pt is having incoordination on L side along with reduced strength through entire LLE.  Pt is motivated but noted that his memory has changed:  forgot that he was working, could not remember how much help he has for home.  Follow for goals of acute PT and focus on strength of L side, balance skills and gait correction of L side hemiparesis.    04/27/20 1600  PT Visit Information  Last PT Received On 04/27/20  Assistance Needed +1  History of Present Illness 60 yo male with onset of L side weakness sustained a fall climbing up to change smoke detector battery on 2/5, then on 2/6 came to ED with significant changes in his L side facially and in L extremities.  Pt has memory changes, coordination changes and impulsive behavior.  PMHx:  substance abuse, HTN, HLD, tobacco use,  Precautions  Precautions Fall  Precaution Comments impulsive standing and walking away from PT  Restrictions  Weight Bearing Restrictions No  Home Living  Family/patient expects to be discharged to: Private residence  Living Arrangements Alone  Available Help at Discharge Family;Friend(s);Available PRN/intermittently  Type of Home House  Home Access Stairs to enter  Entrance Stairs-Number of Steps 4  Entrance Stairs-Rails Can reach both  Home Layout One level  Information systems manager  Home Equipment None  Additional Comments has been working and walks with no AD normally  Prior Function  Level of Independence Independent  Communication  Communication Expressive difficulties  Pain Assessment  Pain Assessment No/denies pain  Cognition  Arousal/Alertness Awake/alert  Behavior During Therapy WFL for tasks assessed/performed  Overall Cognitive Status Impaired/Different from baseline  Area of Impairment Memory;Following  commands;Safety/judgement;Awareness;Problem solving;Orientation;Attention  Orientation Level Situation  Current Attention Level Selective  Memory Decreased recall of precautions;Decreased short-term memory  Following Commands Follows one step commands inconsistently;Follows one step commands with increased time  Safety/Judgement Decreased awareness of safety;Decreased awareness of deficits  Awareness Intellectual  Problem Solving Slow processing;Requires verbal cues;Requires tactile cues  General Comments Pt is unaware of his safety with walking in hosp, stood suddenly to walk with no AD  Upper Extremity Assessment  Upper Extremity Assessment Overall WFL for tasks assessed  Lower Extremity Assessment  Lower Extremity Assessment LLE deficits/detail  LLE Deficits / Details reduced strength and mild incoordination  LLE Coordination decreased gross motor  Cervical / Trunk Assessment  Cervical / Trunk Assessment Normal  Bed Mobility  Overal bed mobility Needs Assistance  Bed Mobility Supine to Sit;Sit to Supine  Supine to sit Supervision;Min guard  Sit to supine Min guard  General bed mobility comments pt is up to side of bed quickly due to needing to walk to BR, but did not stop when PT asked him to wait due to BP line on his arm  Transfers  Overall transfer level Needs assistance  Equipment used 1 person hand held assist  General transfer comment cues for safety and proximity to bed before sitting  Ambulation/Gait  Ambulation/Gait assistance Min guard;Min assist  Gait Distance (Feet) 80 Feet  Assistive device 1 person hand held assist  Gait Pattern/deviations Step-through pattern;Decreased stride length;Wide base of support;Decreased weight shift to left (circumducting on LLE)  General Gait Details pt walks with LLE compensating from CVA, circumducting and leaning to right  side with everystep  Gait velocity reduced  Modified Rankin (Stroke Patients Only)  Pre-Morbid Rankin Score 0   Modified Rankin 3  Balance  Overall balance assessment Needs assistance;History of Falls  Sitting-balance support Feet supported  Sitting balance-Leahy Scale Fair  Postural control Right lateral lean  Standing balance support Single extremity supported;During functional activity  Standing balance-Leahy Scale Good  General Comments  General comments (skin integrity, edema, etc.) pt is up to walk with help and unsafe to take steps alone, despite his willingness to do so.  Encouraged him to ask for help and focus on safety  PT - End of Session  Equipment Utilized During Treatment Gait belt  Activity Tolerance Treatment limited secondary to medical complications (Comment)  Patient left in bed;with call bell/phone within reach;with nursing/sitter in room  Nurse Communication Mobility status  PT Assessment  PT Recommendation/Assessment Patient needs continued PT services  PT Visit Diagnosis Unsteadiness on feet (R26.81);Muscle weakness (generalized) (M62.81);Difficulty in walking, not elsewhere classified (R26.2);Hemiplegia and hemiparesis  Hemiplegia - Right/Left Left  Hemiplegia - dominant/non-dominant Non-dominant  Hemiplegia - caused by Cerebral infarction  PT Problem List Decreased strength;Decreased range of motion;Decreased activity tolerance;Decreased balance;Decreased mobility;Decreased coordination;Decreased knowledge of use of DME;Decreased safety awareness;Cardiopulmonary status limiting activity  Barriers to Discharge Inaccessible home environment;Decreased caregiver support  Barriers to Discharge Comments home with stairs and no help  PT Plan  PT Frequency (ACUTE ONLY) Min 4X/week  PT Treatment/Interventions (ACUTE ONLY) DME instruction;Gait training;Stair training;Functional mobility training;Therapeutic activities;Therapeutic exercise;Balance training;Neuromuscular re-education;Patient/family education  AM-PAC PT "6 Clicks" Mobility Outcome Measure (Version 2)  Help needed  turning from your back to your side while in a flat bed without using bedrails? 3  Help needed moving from lying on your back to sitting on the side of a flat bed without using bedrails? 3  Help needed moving to and from a bed to a chair (including a wheelchair)? 3  Help needed standing up from a chair using your arms (e.g., wheelchair or bedside chair)? 3  Help needed to walk in hospital room? 3  Help needed climbing 3-5 steps with a railing?  1  6 Click Score 16  Consider Recommendation of Discharge To: Home with [facility]  PT Recommendation  Recommendations for Other Services Rehab consult  Follow Up Recommendations CIR  PT equipment None recommended by PT  Individuals Consulted  Consulted and Agree with Results and Recommendations Patient unable/family or caregiver not available  Acute Rehab PT Goals  Patient Stated Goal to get to the bathroom  PT Goal Formulation With patient  Potential to Achieve Goals Good  PT Time Calculation  PT Start Time (ACUTE ONLY) 1555  PT Stop Time (ACUTE ONLY) 1627  PT Time Calculation (min) (ACUTE ONLY) 32 min  PT General Charges  $$ ACUTE PT VISIT 1 Visit  PT Evaluation  $PT Eval Moderate Complexity 1 Mod  PT Treatments  $Gait Training 8-22 mins  Written Expression  Dominant Hand Right    Samul Dada, PT MS Acute Rehab Dept. Number: [facility] [id number] and [facility] [phone]

## 2020-04-27 NOTE — Progress Notes (Signed)
Rehab Admissions Coordinator Note:  Patient was screened by Timothy Baker for appropriateness for an Inpatient Acute Rehab Consult per therapy recommendations. Patient high level with initial eval. I would like to see how patient progresses over the next 24 hrs to assess if CIR still recommended. He may progress quickly not to need inpt rehab  Timothy Baker 04/27/2020, 6:52 PM  I can be reached at [phone].

## 2020-04-28 ENCOUNTER — Inpatient Hospital Stay (HOSPITAL_COMMUNITY): Payer: BC Managed Care – PPO

## 2020-04-28 ENCOUNTER — Other Ambulatory Visit: Payer: Self-pay

## 2020-04-28 DIAGNOSIS — E7800 Pure hypercholesterolemia, unspecified: Secondary | ICD-10-CM

## 2020-04-28 DIAGNOSIS — I6389 Other cerebral infarction: Secondary | ICD-10-CM

## 2020-04-28 DIAGNOSIS — I639 Cerebral infarction, unspecified: Secondary | ICD-10-CM | POA: Diagnosis not present

## 2020-04-28 DIAGNOSIS — I10 Essential (primary) hypertension: Secondary | ICD-10-CM

## 2020-04-28 LAB — LIPID PANEL
Cholesterol: 156 mg/dL (ref 0–200)
HDL: 34 mg/dL — ABNORMAL LOW (ref >40)
LDL Cholesterol: 107 mg/dL — ABNORMAL HIGH (ref 0–99)
Total CHOL/HDL Ratio: 4.6 RATIO
Triglycerides: 74 mg/dL (ref <150)
VLDL: 15 mg/dL (ref 0–40)

## 2020-04-28 LAB — RPR: RPR Ser Ql: NONREACTIVE

## 2020-04-28 LAB — VITAMIN B12: Vitamin B-12: 235 pg/mL (ref 180–914)

## 2020-04-28 LAB — HIV ANTIBODY (ROUTINE TESTING W REFLEX): HIV Screen 4th Generation wRfx: NONREACTIVE

## 2020-04-28 LAB — HEMOGLOBIN A1C
Hgb A1c MFr Bld: 5.9 % — ABNORMAL HIGH (ref 4.8–5.6)
Mean Plasma Glucose: 122.63 mg/dL

## 2020-04-28 LAB — C-REACTIVE PROTEIN: CRP: 0.6 mg/dL (ref <1.0)

## 2020-04-28 LAB — ECHOCARDIOGRAM COMPLETE: S' Lateral: 2.7 cm

## 2020-04-28 LAB — TSH: TSH: 2.128 u[IU]/mL (ref 0.350–4.500)

## 2020-04-28 LAB — SEDIMENTATION RATE: Sed Rate: 20 mm/hr — ABNORMAL HIGH (ref 0–16)

## 2020-04-28 MED ORDER — ASPIRIN EC 81 MG PO TBEC
81.0000 mg | DELAYED_RELEASE_TABLET | Freq: Every day | ORAL | Status: DC
  Administered 2020-04-28 – 2020-05-01 (×4): 81 mg via ORAL
  Filled 2020-04-28 (×4): qty 1

## 2020-04-28 MED ORDER — SODIUM CHLORIDE 0.9 % IV SOLN: INTRAVENOUS | Status: DC

## 2020-04-28 MED ORDER — CLOPIDOGREL BISULFATE 75 MG PO TABS
75.0000 mg | ORAL_TABLET | Freq: Every day | ORAL | Status: DC
  Administered 2020-04-28 – 2020-05-01 (×4): 75 mg via ORAL
  Filled 2020-04-28 (×4): qty 1

## 2020-04-28 MED ORDER — INFLUENZA VAC SPLIT QUAD 0.5 ML IM SUSY
0.5000 mL | PREFILLED_SYRINGE | INTRAMUSCULAR | Status: AC
  Administered 2020-04-29: 0.5 mL via INTRAMUSCULAR
  Filled 2020-04-28: qty 0.5

## 2020-04-28 NOTE — ED Notes (Signed)
Lunch Tray Ordered @ 1036. 

## 2020-04-28 NOTE — ED Notes (Signed)
Report given to Anna, RN 

## 2020-04-28 NOTE — ED Notes (Signed)
Pt has left sided facial droop-- states he feels as if he is drooling. No drooling noted, has passed swallow screen

## 2020-04-28 NOTE — ED Notes (Signed)
Breakfast given.  

## 2020-04-28 NOTE — ED Notes (Signed)
Sister at bedside.

## 2020-04-28 NOTE — Progress Notes (Signed)
Physical Therapy Treatment Patient Details Name: Timothy Baker MRN: [id number] DOB: 05/14/1960 Today's Date: 04/28/2020    History of Present Illness 60 yo male with onset of L side weakness sustained a fall climbing up to change smoke detector battery on 2/5, then on 2/6 came to ED with significant changes in his L side facially and in L extremities.  On imaging his notes were that the infarction was in R anterior limb of internal capsule.  Pt has memory changes, coordination changes and impulsive behavior.  PMHx:  substance abuse, HTN, HLD, tobacco use,    PT Comments    Pt was seen for further balance and exercise training, with gait using HHA.  Talked with nursing afterward about pt's instability on standing balance tasks of tandem standing, narrow base standing and changes of direction.  Follow for recovery of these skills, as pt currently requires help to maintain balance on standing balance skills.  Follow acutely as planned.   Follow Up Recommendations  CIR     Equipment Recommendations  None recommended by PT    Recommendations for Other Services Rehab consult     Precautions / Restrictions Precautions Precautions: Fall Precaution Comments: stands with quick movements, not in control Restrictions Weight Bearing Restrictions: No    Mobility  Bed Mobility Overal bed mobility: Needs Assistance Bed Mobility: Supine to Sit     Supine to sit: Min guard Sit to supine: Min guard      Transfers Overall transfer level: Needs assistance Equipment used: 1 person hand held assist Transfers: Sit to/from Stand Sit to Stand: Min guard            Ambulation/Gait Ambulation/Gait assistance: Min guard;Min assist   Assistive device: 1 person hand held assist Gait Pattern/deviations: Step-to pattern;Step-through pattern;Decreased stride length;Decreased weight shift to left (mild LLE Circumduction) Gait velocity: reduced Gait velocity interpretation: <1.31 ft/sec,  indicative of household Industrial/product designer Rankin (Stroke Patients Only)       Balance Overall balance assessment: Needs assistance Sitting-balance support: Feet supported Sitting balance-Leahy Scale: Good       Standing balance-Leahy Scale: Fair                              Cognition Arousal/Alertness: Awake/alert Behavior During Therapy: Impulsive Overall Cognitive Status: Impaired/Different from baseline Area of Impairment: Awareness;Safety/judgement;Attention                   Current Attention Level: Selective Memory: Decreased short-term memory Following Commands: Follows one step commands inconsistently;Follows one step commands with increased time Safety/Judgement: Decreased awareness of deficits Awareness: Intellectual Problem Solving: Slow processing;Requires verbal cues;Requires tactile cues        Exercises General Exercises - Lower Extremity Long Arc Quad: Strengthening;10 reps Heel Slides: Strengthening;10 reps Hip ABduction/ADduction: Strengthening;10 reps    General Comments General comments (skin integrity, edema, etc.): pt is up to walk and did balance skills but requires continual support to be safe      Pertinent Vitals/Pain Pain Assessment: No/denies pain    Home Living                      Prior Function            PT Goals (current goals can now be found in the care plan section)  Acute Rehab PT Goals Patient Stated Goal: get his strength back Progress towards PT goals: Progressing toward goals    Frequency    Min 4X/week      PT Plan Current plan remains appropriate    Co-evaluation              AM-PAC PT "6 Clicks" Mobility   Outcome Measure  Help needed turning from your back to your side while in a flat bed without using bedrails?: None Help needed moving from lying on your back to sitting on the side of a flat bed without using  bedrails?: A Little Help needed moving to and from a bed to a chair (including a wheelchair)?: A Little Help needed standing up from a chair using your arms (e.g., wheelchair or bedside chair)?: A Little Help needed to walk in hospital room?: A Little Help needed climbing 3-5 steps with a railing? : Total 6 Click Score: 17    End of Session Equipment Utilized During Treatment: Gait belt Activity Tolerance: Patient limited by fatigue Patient left: in bed;with call bell/phone within reach;with nursing/sitter in room Nurse Communication: Mobility status PT Visit Diagnosis: Unsteadiness on feet (R26.81);Muscle weakness (generalized) (M62.81);Difficulty in walking, not elsewhere classified (R26.2);Hemiplegia and hemiparesis Hemiplegia - Right/Left: Left Hemiplegia - dominant/non-dominant: Non-dominant Hemiplegia - caused by: Cerebral infarction     Time: 1750-1811 PT Time Calculation (min) (ACUTE ONLY): 21 min  Charges:  $Neuromuscular Re-education: 8-22 mins                    Ivar Drape 04/28/2020, 9:23 PM  Samul Dada, PT MS Acute Rehab Dept. Number: [facility] [id number] and [facility] [phone]

## 2020-04-28 NOTE — Progress Notes (Signed)
STROKE TEAM PROGRESS NOTE   INTERVAL HISTORY No family at the bedside.  Patient lying in bed, no complaints, still has mild left facial droop and left arm pronator drift. MRI showed scattered right periventricular, CR, ASO, BG small infarcts.  Pattern concerning for embolic source.  CT head and neck unremarkable.  Recommend TEE and loop recorder.  Smoking cessation education provided.  Vitals:   04/27/20 1842 04/27/20 2201 04/28/20 0239 04/28/20 0633  BP: (!) 138/98 (!) 147/90 (!) 146/94 139/88  Pulse: 90 84 84 82  Resp: 17 16 16 18   Temp: 98.6 F (37 C) 98.8 F (37.1 C)    TempSrc: Oral     SpO2: 98% 94% 98% 97%   CBC:  Recent Labs  Lab 04/27/20 1020  WBC 10.7*  NEUTROABS 7.2  HGB 15.4  HCT 46.5  MCV 94.1  PLT 212   Basic Metabolic Panel:  Recent Labs  Lab 04/27/20 1020  NA 140  K 3.5  CL 107  CO2 22  GLUCOSE 118*  BUN 9  CREATININE 0.99  CALCIUM 9.0   Lipid Panel:  Recent Labs  Lab 04/28/20 0629  CHOL 156  TRIG 74  HDL 34*  CHOLHDL 4.6  VLDL 15  LDLCALC 06/26/20*   HgbA1c:  Recent Labs  Lab 04/28/20 0629  HGBA1C 5.9*   Urine Drug Screen:  Recent Labs  Lab 04/27/20 1635  LABOPIA NONE DETECTED  COCAINSCRNUR NONE DETECTED  LABBENZ NONE DETECTED  AMPHETMU NONE DETECTED  THCU NONE DETECTED  LABBARB NONE DETECTED    Alcohol Level No results for input(s): ETH in the last 168 hours.  IMAGING past 24 hours CT ANGIO HEAD W OR WO CONTRAST  Result Date: 04/27/2020 CLINICAL DATA:  Right hemisphere small vessel infarcts. EXAM: CT ANGIOGRAPHY HEAD AND NECK TECHNIQUE: Multidetector CT imaging of the head and neck was performed using the standard protocol during bolus administration of intravenous contrast. Multiplanar CT image reconstructions and MIPs were obtained to evaluate the vascular anatomy. Carotid stenosis measurements (when applicable) are obtained utilizing NASCET criteria, using the distal internal carotid diameter as the denominator. CONTRAST:  44mL  OMNIPAQUE IOHEXOL 350 MG/ML SOLN COMPARISON:  None. FINDINGS: CTA NECK FINDINGS SKELETON: There is no bony spinal canal stenosis. No lytic or blastic lesion. OTHER NECK: Normal pharynx, larynx and major salivary glands. No cervical lymphadenopathy. Unremarkable thyroid gland. UPPER CHEST: No pneumothorax or pleural effusion. No nodules or masses. AORTIC ARCH: There is no calcific atherosclerosis of the aortic arch. There is no aneurysm, dissection or hemodynamically significant stenosis of the visualized portion of the aorta. Conventional 3 vessel aortic branching pattern. The visualized proximal subclavian arteries are widely patent. RIGHT CAROTID SYSTEM: Normal without aneurysm, dissection or stenosis. LEFT CAROTID SYSTEM: Normal without aneurysm, dissection or stenosis. VERTEBRAL ARTERIES: Left dominant configuration. Both origins are clearly patent. There is no dissection, occlusion or flow-limiting stenosis to the skull base (V1-V3 segments). CTA HEAD FINDINGS POSTERIOR CIRCULATION: --Vertebral arteries: Normal V4 segments. --Inferior cerebellar arteries: Normal. --Basilar artery: Normal. --Superior cerebellar arteries: Normal. --Posterior cerebral arteries (PCA): Normal. ANTERIOR CIRCULATION: --Intracranial internal carotid arteries: Normal. --Anterior cerebral arteries (ACA): Normal. Both A1 segments are present. Patent anterior communicating artery (a-comm). --Middle cerebral arteries (MCA): Normal. VENOUS SINUSES: As permitted by contrast timing, patent. ANATOMIC VARIANTS: Fetal origins of both posterior cerebral arteries. Review of the MIP images confirms the above findings. IMPRESSION: Normal CTA of the head and neck. Electronically Signed   By: 71m M.D.   On: 04/27/2020 19:35  CT HEAD WO CONTRAST  Result Date: 04/27/2020 CLINICAL DATA:  Left-sided facial droop and slurred speech EXAM: CT HEAD WITHOUT CONTRAST TECHNIQUE: Contiguous axial images were obtained from the base of the skull  through the vertex without intravenous contrast. COMPARISON:  None. FINDINGS: Brain: Ventricles and sulci are normal in size and configuration. There is no appreciable intracranial mass, hemorrhage, extra-axial fluid collection, or midline shift. There is decreased attenuation involving the anterior limb of the right internal capsule as well as the immediately adjacent anterior, inferior right centrum semiovale, suspicious for recent and likely acute infarct in this region. Elsewhere brain parenchyma appears unremarkable. Vascular: No hyperdense vessel. There is calcification in each carotid siphon region. Skull: Bony calvarium appears intact. Sinuses/Orbits: Visualized paranasal sinuses are clear. Orbits appear symmetric bilaterally. Other: Mastoid air cells are clear. IMPRESSION: Recent appearing and suspected acute infarct involving a portion of the anterior limb of the right internal capsule and immediately adjacent right centrum semiovale. Elsewhere brain parenchyma appears unremarkable. No mass or hemorrhage demonstrable. There are foci of arterial vascular calcification. Electronically Signed   By: Bretta BangWilliam  Woodruff III M.D.   On: 04/27/2020 11:30   CT ANGIO NECK W OR WO CONTRAST  Result Date: 04/27/2020 CLINICAL DATA:  Right hemisphere small vessel infarcts. EXAM: CT ANGIOGRAPHY HEAD AND NECK TECHNIQUE: Multidetector CT imaging of the head and neck was performed using the standard protocol during bolus administration of intravenous contrast. Multiplanar CT image reconstructions and MIPs were obtained to evaluate the vascular anatomy. Carotid stenosis measurements (when applicable) are obtained utilizing NASCET criteria, using the distal internal carotid diameter as the denominator. CONTRAST:  75mL OMNIPAQUE IOHEXOL 350 MG/ML SOLN COMPARISON:  None. FINDINGS: CTA NECK FINDINGS SKELETON: There is no bony spinal canal stenosis. No lytic or blastic lesion. OTHER NECK: Normal pharynx, larynx and major salivary  glands. No cervical lymphadenopathy. Unremarkable thyroid gland. UPPER CHEST: No pneumothorax or pleural effusion. No nodules or masses. AORTIC ARCH: There is no calcific atherosclerosis of the aortic arch. There is no aneurysm, dissection or hemodynamically significant stenosis of the visualized portion of the aorta. Conventional 3 vessel aortic branching pattern. The visualized proximal subclavian arteries are widely patent. RIGHT CAROTID SYSTEM: Normal without aneurysm, dissection or stenosis. LEFT CAROTID SYSTEM: Normal without aneurysm, dissection or stenosis. VERTEBRAL ARTERIES: Left dominant configuration. Both origins are clearly patent. There is no dissection, occlusion or flow-limiting stenosis to the skull base (V1-V3 segments). CTA HEAD FINDINGS POSTERIOR CIRCULATION: --Vertebral arteries: Normal V4 segments. --Inferior cerebellar arteries: Normal. --Basilar artery: Normal. --Superior cerebellar arteries: Normal. --Posterior cerebral arteries (PCA): Normal. ANTERIOR CIRCULATION: --Intracranial internal carotid arteries: Normal. --Anterior cerebral arteries (ACA): Normal. Both A1 segments are present. Patent anterior communicating artery (a-comm). --Middle cerebral arteries (MCA): Normal. VENOUS SINUSES: As permitted by contrast timing, patent. ANATOMIC VARIANTS: Fetal origins of both posterior cerebral arteries. Review of the MIP images confirms the above findings. IMPRESSION: Normal CTA of the head and neck. Electronically Signed   By: Deatra RobinsonKevin  Herman M.D.   On: 04/27/2020 19:35   MR BRAIN WO CONTRAST  Result Date: 04/27/2020 CLINICAL DATA:  Initial evaluation for neuro deficit, acute stroke. EXAM: MRI HEAD WITHOUT CONTRAST TECHNIQUE: Multiplanar, multiecho pulse sequences of the brain and surrounding structures were obtained without intravenous contrast. COMPARISON:  Prior head CT from earlier the same day. FINDINGS: Brain: Examination degraded by motion artifact. Cerebral volume within normal limits  for age. Mild chronic microvascular ischemic disease present within the periventricular and deep white matter both cerebral hemispheres. Patchy multifocal  areas of restricted diffusion seen involving the periventricular and deep white matter of the right corona radiata and centrum semi ovale. Mild patchy involvement of the underlying right basal ganglia and subinsular white matter. No associated hemorrhage or mass effect. No other evidence for acute or subacute ischemia. Gray-white matter differentiation otherwise maintained. No other encephalomalacia to suggest chronic cortical infarction. No other evidence for acute or chronic intracranial hemorrhage. No mass lesion, midline shift or mass effect. No hydrocephalus or extra-axial fluid collection. Pituitary gland suprasellar region within normal limits. Midline structures intact. Vascular: Major intracranial vascular flow voids are maintained. Skull and upper cervical spine: Craniocervical junction normal. Bone marrow signal intensity within normal limits. No scalp soft tissue abnormality. Sinuses/Orbits: Globes and orbital soft tissues demonstrate no acute finding. Mild scattered mucosal thickening noted within the ethmoidal air cells and maxillary sinuses. Paranasal sinuses are otherwise clear. No mastoid effusion. Inner ear structures grossly normal. Other: None. IMPRESSION: 1. Patchy multifocal acute ischemic nonhemorrhagic infarcts involving the periventricular and deep white matter of the right corona radiata and centrum semi ovale, with involvement of the underlying right basal ganglia. 2. Underlying mild chronic microvascular ischemic disease. Electronically Signed   By: Rise Mu M.D.   On: 04/27/2020 18:38   VAS [place] LOWER EXTREMITY VENOUS (DVT)  Result Date: 04/28/2020  Lower Venous DVT Study Indications: Stroke.  Risk Factors: None identified. Comparison Study: No previous Performing Technologist: Clint Guy RVT  Examination Guidelines: A  complete evaluation includes B-mode imaging, spectral Doppler, color Doppler, and power Doppler as needed of all accessible portions of each vessel. Bilateral testing is considered an integral part of a complete examination. Limited examinations for reoccurring indications may be performed as noted. The reflux portion of the exam is performed with the patient in reverse Trendelenburg.  +---------+---------------+---------+-----------+----------+--------------+ RIGHT    CompressibilityPhasicitySpontaneityPropertiesThrombus Aging +---------+---------------+---------+-----------+----------+--------------+ CFV      Full           Yes      Yes                                 +---------+---------------+---------+-----------+----------+--------------+ SFJ      Full                                                        +---------+---------------+---------+-----------+----------+--------------+ FV Prox  Full                                                        +---------+---------------+---------+-----------+----------+--------------+ FV Mid   Full                                                        +---------+---------------+---------+-----------+----------+--------------+ FV DistalFull                                                        +---------+---------------+---------+-----------+----------+--------------+  PFV      Full                                                        +---------+---------------+---------+-----------+----------+--------------+ POP      Full           Yes      Yes                                 +---------+---------------+---------+-----------+----------+--------------+ PTV      Full                                                        +---------+---------------+---------+-----------+----------+--------------+ PERO     Full                                                         +---------+---------------+---------+-----------+----------+--------------+   +---------+---------------+---------+-----------+----------+--------------+ LEFT     CompressibilityPhasicitySpontaneityPropertiesThrombus Aging +---------+---------------+---------+-----------+----------+--------------+ CFV      Full           Yes      Yes                                 +---------+---------------+---------+-----------+----------+--------------+ SFJ      Full                                                        +---------+---------------+---------+-----------+----------+--------------+ FV Prox  Full                                                        +---------+---------------+---------+-----------+----------+--------------+ FV Mid   Full                                                        +---------+---------------+---------+-----------+----------+--------------+ FV DistalFull                                                        +---------+---------------+---------+-----------+----------+--------------+ PFV      Full                                                        +---------+---------------+---------+-----------+----------+--------------+  POP      Full           Yes      Yes                                 +---------+---------------+---------+-----------+----------+--------------+ PTV      Full                                                        +---------+---------------+---------+-----------+----------+--------------+ PERO     Full                                                        +---------+---------------+---------+-----------+----------+--------------+     Summary: BILATERAL: - No evidence of deep vein thrombosis seen in the lower extremities, bilaterally. -No evidence of popliteal cyst, bilaterally.   *See table(s) above for measurements and observations.    Preliminary     PHYSICAL EXAM  Temp:  [98.1 F (36.7 C)-99  F (37.2 C)] 98.1 F (36.7 C) (02/08 1538) Pulse Rate:  [68-90] 68 (02/08 1538) Resp:  [16-18] 16 (02/08 1538) BP: (126-147)/(82-98) 134/83 (02/08 1538) SpO2:  [94 %-100 %] 100 % (02/08 1538) Weight:  [88.4 kg] 88.4 kg (02/08 1538)  General - Well nourished, well developed, in no apparent distress.  Ophthalmologic - fundi not visualized due to noncooperation.  Cardiovascular - Regular rhythm and rate.  Mental Status -  Level of arousal and orientation to time, place, and person were intact. Language including expression, naming, repetition, comprehension was assessed and found intact. Fund of Knowledge was assessed and was intact  Cranial Nerves II - XII - II - Visual field intact OU. III, IV, VI - Extraocular movements intact. V - Facial sensation intact bilaterally. VII - left nasolabial fold flattening. VIII - Hearing & vestibular intact bilaterally. X - Palate elevates symmetrically. XI - Chin turning & shoulder shrug intact bilaterally. XII - Tongue protrusion intact.  Motor Strength - The patient's strength was normal in all extremities except pronator drift was present on the left with 4+/5 left finger grip.  Bulk was normal and fasciculations were absent.   Motor Tone - Muscle tone was assessed at the neck and appendages and was normal.  Reflexes - The patient's reflexes were symmetrical in all extremities and he had no pathological reflexes.  Sensory - Light touch, temperature/pinprick were assessed and were decreased light touch sensation on the left  Coordination - The patient had normal movements in the hands with no ataxia or dysmetria although slow on the left.  Tremor was absent.  Gait and Station - deferred.   ASSESSMENT/PLAN  60 year old male presents with left-sided weakness, left facial droop, and dysarthria for approximately 3 days. Only known medical history is cigarette smoking and reported borderline hypertension. CT head on arrival with evidence of  an acute/subacute infarct in a portion of the anterior limb of the right internal capsule and adjacent white matter.  Neurology exam findings are congruent with the location of the stroke seen on CT, but also suggestive of right parietal lobe involvement.    Stroke - patchy  multifocal acute infarcts involving the right CR, SO and BG, embolic pattern, source unclear  CT head Recent appearing and suspected acute infarct involving a portion ofthe anterior limb of the right internal capsule and immediately adjacent right centrum semiovale.    CTA head & neck: Normal CTA of the head and neck.  MRI  Patchy multifocal acute ischemic nonhemorrhagic infarcts involving the periventricular and deep white matter of the rightcorona radiata and centrum semi ovale, with involvement of the underlying right basal ganglia.   2D Echo EF 60 to 65%. No shunt.   LE venous doppler - no DVT  Recommend further embolic work-up with TEE and possible loop recorder  LDL 107  HgbA1c 5.9  VTE prophylaxis -Lovenox  No antithrombotic prior to admission, now on aspirin 81 mg daily and clopidogrel 75 mg daily DAPT for 3 weeks and then aspirin alone.  Therapy recommendations: Pending  Disposition: Pending  Borderline Hypertension  Home meds:  None . BP stable . Long-term BP goal normotensive  Hyperlipidemia  Home meds:  None  LDL 107, goal < 70  Add Lipitor 40  Continue statin at discharge  Tobacco abuse  Current smoker  Smoking cessation counseling provided  Nicotine patch provided  Pt is willing to quit  Other Stroke Risk Factors  Other Active Problems  Leukocytosis WBC 10.7  Hospital day # 1  Marvel Plan, MD PhD Stroke Neurology 04/28/2020 5:01 PM    To contact Stroke Continuity provider, please refer to [email or web address]. After hours, contact General Neurology

## 2020-04-28 NOTE — Progress Notes (Signed)
[facility] Progress Note  Patient: Timothy Baker    ZOX:[id number]  DOA: 04/27/2020     Date of Service: the patient was seen and examined on 04/28/2020  Brief hospital course: No significant past medical history presents with complaints of difficulty speaking and left-sided weakness.  Found to have acute CVA.  Neurology following. Currently plan is for the stroke work-up.  Assessment and Plan: 1.  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 Out of the window.  For TPA. CT A negative for any large vessel occlusion. Echocardiogram showed preserved EF. Lower extremity Doppler negative for DVT. Neurology recommended embolic work-up including a TEE as well as loop recorder. LDL 107. Hemoglobin A1c 5.9. Now on 81 mg aspirin as well as Plavix. Continue DAPT for 3 weeks and then aspirin alone. Also on Lipitor.  2.  Hypertension Currently allowing permissive hypertension. Monitor.  3.  HLD On statin.  Monitor.  Lipitor 40.  4.  Active smoker Continue nicotine patch. Patient willing to quit right now. Counseling provided.  Diet: Cardiac diet DVT Prophylaxis:   enoxaparin (LOVENOX) injection 40 mg Start: 04/27/20 1600    Advance goals of care discussion: Full code  Family Communication: no family was present at bedside, at the time of interview.   Disposition:  Status is: Inpatient  Remains inpatient appropriate because:Ongoing diagnostic testing needed not appropriate for outpatient work up   Dispo: The patient is from: Home              Anticipated d/c is to: Home              Anticipated d/c date is: 2 days              Patient currently is not medically stable to d/c.   Difficult to place patient         Subjective: No nausea no vomiting or no fever no chills.  No chest pain.  Abdominal pain.  Continues to have difficulty with speech as well as weakness.  Physical Exam:  General: Appear in mild distress, no Rash; Oral  Mucosa Clear, moist. no Abnormal Neck Mass Or lumps, Conjunctiva normal  Cardiovascular: S1 and S2 Present, no Murmur, Respiratory: good respiratory effort, Bilateral Air entry present and CTA, no Crackles, no wheezes Abdomen: Bowel Sound present, Soft and no tenderness Extremities: no Pedal edema Neurology: alert and oriented to time, place, and person affect appropriate.  Left-sided weakness and right-sided facial droop, no new focal deficit Gait not checked due to patient safety concerns  Vitals:   04/28/20 1042 04/28/20 1435 04/28/20 1515 04/28/20 1538  BP: 126/82 129/85  134/83  Pulse: 73 77  68  Resp: 18 18  16   Temp:   99 F (37.2 C) 98.1 F (36.7 C)  TempSrc:   Oral Oral  SpO2: 99% 98%  100%  Weight:    88.4 kg  Height:    6\' 3"  (1.905 m)   No intake or output data in the 24 hours ending 04/28/20 1800 Filed Weights   04/28/20 1538  Weight: 88.4 kg    Data Reviewed: I have personally reviewed and interpreted daily labs, tele strips, imaging. I reviewed all nursing notes, pharmacy notes, vitals, pertinent old records I have discussed plan of care as described above with RN and patient/family.  CBC: Recent Labs  Lab 04/27/20 1020  WBC 10.7*  NEUTROABS 7.2  HGB 15.4  HCT 46.5  MCV 94.1  PLT 212   Basic Metabolic  Panel: Recent Labs  Lab 04/27/20 1020  NA 140  K 3.5  CL 107  CO2 22  GLUCOSE 118*  BUN 9  CREATININE 0.99  CALCIUM 9.0    Studies: CT ANGIO HEAD W OR WO CONTRAST  Result Date: 04/27/2020 CLINICAL DATA:  Right hemisphere small vessel infarcts. EXAM: CT ANGIOGRAPHY HEAD AND NECK TECHNIQUE: Multidetector CT imaging of the head and neck was performed using the standard protocol during bolus administration of intravenous contrast. Multiplanar CT image reconstructions and MIPs were obtained to evaluate the vascular anatomy. Carotid stenosis measurements (when applicable) are obtained utilizing NASCET criteria, using the distal internal carotid  diameter as the denominator. CONTRAST:  75mL OMNIPAQUE IOHEXOL 350 MG/ML SOLN COMPARISON:  None. FINDINGS: CTA NECK FINDINGS SKELETON: There is no bony spinal canal stenosis. No lytic or blastic lesion. OTHER NECK: Normal pharynx, larynx and major salivary glands. No cervical lymphadenopathy. Unremarkable thyroid gland. UPPER CHEST: No pneumothorax or pleural effusion. No nodules or masses. AORTIC ARCH: There is no calcific atherosclerosis of the aortic arch. There is no aneurysm, dissection or hemodynamically significant stenosis of the visualized portion of the aorta. Conventional 3 vessel aortic branching pattern. The visualized proximal subclavian arteries are widely patent. RIGHT CAROTID SYSTEM: Normal without aneurysm, dissection or stenosis. LEFT CAROTID SYSTEM: Normal without aneurysm, dissection or stenosis. VERTEBRAL ARTERIES: Left dominant configuration. Both origins are clearly patent. There is no dissection, occlusion or flow-limiting stenosis to the skull base (V1-V3 segments). CTA HEAD FINDINGS POSTERIOR CIRCULATION: --Vertebral arteries: Normal V4 segments. --Inferior cerebellar arteries: Normal. --Basilar artery: Normal. --Superior cerebellar arteries: Normal. --Posterior cerebral arteries (PCA): Normal. ANTERIOR CIRCULATION: --Intracranial internal carotid arteries: Normal. --Anterior cerebral arteries (ACA): Normal. Both A1 segments are present. Patent anterior communicating artery (a-comm). --Middle cerebral arteries (MCA): Normal. VENOUS SINUSES: As permitted by contrast timing, patent. ANATOMIC VARIANTS: Fetal origins of both posterior cerebral arteries. Review of the MIP images confirms the above findings. IMPRESSION: Normal CTA of the head and neck. Electronically Signed   By: Deatra Robinson M.D.   On: 04/27/2020 19:35   CT ANGIO NECK W OR WO CONTRAST  Result Date: 04/27/2020 CLINICAL DATA:  Right hemisphere small vessel infarcts. EXAM: CT ANGIOGRAPHY HEAD AND NECK TECHNIQUE: Multidetector  CT imaging of the head and neck was performed using the standard protocol during bolus administration of intravenous contrast. Multiplanar CT image reconstructions and MIPs were obtained to evaluate the vascular anatomy. Carotid stenosis measurements (when applicable) are obtained utilizing NASCET criteria, using the distal internal carotid diameter as the denominator. CONTRAST:  75mL OMNIPAQUE IOHEXOL 350 MG/ML SOLN COMPARISON:  None. FINDINGS: CTA NECK FINDINGS SKELETON: There is no bony spinal canal stenosis. No lytic or blastic lesion. OTHER NECK: Normal pharynx, larynx and major salivary glands. No cervical lymphadenopathy. Unremarkable thyroid gland. UPPER CHEST: No pneumothorax or pleural effusion. No nodules or masses. AORTIC ARCH: There is no calcific atherosclerosis of the aortic arch. There is no aneurysm, dissection or hemodynamically significant stenosis of the visualized portion of the aorta. Conventional 3 vessel aortic branching pattern. The visualized proximal subclavian arteries are widely patent. RIGHT CAROTID SYSTEM: Normal without aneurysm, dissection or stenosis. LEFT CAROTID SYSTEM: Normal without aneurysm, dissection or stenosis. VERTEBRAL ARTERIES: Left dominant configuration. Both origins are clearly patent. There is no dissection, occlusion or flow-limiting stenosis to the skull base (V1-V3 segments). CTA HEAD FINDINGS POSTERIOR CIRCULATION: --Vertebral arteries: Normal V4 segments. --Inferior cerebellar arteries: Normal. --Basilar artery: Normal. --Superior cerebellar arteries: Normal. --Posterior cerebral arteries (PCA): Normal. ANTERIOR CIRCULATION: --Intracranial internal  carotid arteries: Normal. --Anterior cerebral arteries (ACA): Normal. Both A1 segments are present. Patent anterior communicating artery (a-comm). --Middle cerebral arteries (MCA): Normal. VENOUS SINUSES: As permitted by contrast timing, patent. ANATOMIC VARIANTS: Fetal origins of both posterior cerebral arteries.  Review of the MIP images confirms the above findings. IMPRESSION: Normal CTA of the head and neck. Electronically Signed   By: Deatra Robinson M.D.   On: 04/27/2020 19:35   MR BRAIN WO CONTRAST  Result Date: 04/27/2020 CLINICAL DATA:  Initial evaluation for neuro deficit, acute stroke. EXAM: MRI HEAD WITHOUT CONTRAST TECHNIQUE: Multiplanar, multiecho pulse sequences of the brain and surrounding structures were obtained without intravenous contrast. COMPARISON:  Prior head CT from earlier the same day. FINDINGS: Brain: Examination degraded by motion artifact. Cerebral volume within normal limits for age. Mild chronic microvascular ischemic disease present within the periventricular and deep white matter both cerebral hemispheres. Patchy multifocal areas of restricted diffusion seen involving the periventricular and deep white matter of the right corona radiata and centrum semi ovale. Mild patchy involvement of the underlying right basal ganglia and subinsular white matter. No associated hemorrhage or mass effect. No other evidence for acute or subacute ischemia. Gray-white matter differentiation otherwise maintained. No other encephalomalacia to suggest chronic cortical infarction. No other evidence for acute or chronic intracranial hemorrhage. No mass lesion, midline shift or mass effect. No hydrocephalus or extra-axial fluid collection. Pituitary gland suprasellar region within normal limits. Midline structures intact. Vascular: Major intracranial vascular flow voids are maintained. Skull and upper cervical spine: Craniocervical junction normal. Bone marrow signal intensity within normal limits. No scalp soft tissue abnormality. Sinuses/Orbits: Globes and orbital soft tissues demonstrate no acute finding. Mild scattered mucosal thickening noted within the ethmoidal air cells and maxillary sinuses. Paranasal sinuses are otherwise clear. No mastoid effusion. Inner ear structures grossly normal. Other: None.  IMPRESSION: 1. Patchy multifocal acute ischemic nonhemorrhagic infarcts involving the periventricular and deep white matter of the right corona radiata and centrum semi ovale, with involvement of the underlying right basal ganglia. 2. Underlying mild chronic microvascular ischemic disease. Electronically Signed   By: Rise Mu M.D.   On: 04/27/2020 18:38   ECHOCARDIOGRAM COMPLETE  Result Date: 04/28/2020    ECHOCARDIOGRAM REPORT   Patient Name:   Timothy Baker Date of Exam: 04/28/2020 Medical Rec #:  [id number]          Height: Accession #:    [id number]         Weight: Date of Birth:  09-13-1960          BSA: Patient Age:    59 years           BP:           139/88 mmHg Patient Gender: M                  HR:           71 bpm. Exam Location:  Inpatient Procedure: 2D Echo Indications:    stroke  History:        Patient has no prior history of Echocardiogram examinations.                 Risk Factors:Current Smoker.  Sonographer:    Delcie Roch Referring Phys: [id number] JENNIFER YATES IMPRESSIONS  1. Left ventricular ejection fraction, by estimation, is 60 to 65%. The left ventricle has normal function. The left ventricle has no regional wall motion abnormalities. Left ventricular diastolic parameters are indeterminate.  2. Right  ventricular systolic function is normal. The right ventricular size is normal. Tricuspid regurgitation signal is inadequate for assessing PA pressure.  3. The mitral valve is normal in structure. No evidence of mitral valve regurgitation. No evidence of mitral stenosis.  4. The aortic valve is tricuspid. Aortic valve regurgitation is trivial. No aortic stenosis is present.  5. The inferior vena cava is normal in size with greater than 50% respiratory variability, suggesting right atrial pressure of 3 mmHg. FINDINGS  Left Ventricle: Left ventricular ejection fraction, by estimation, is 60 to 65%. The left ventricle has normal function. The left ventricle has no regional wall  motion abnormalities. The left ventricular internal cavity size was normal in size. There is  no left ventricular hypertrophy. Left ventricular diastolic parameters are indeterminate. Right Ventricle: The right ventricular size is normal. No increase in right ventricular wall thickness. Right ventricular systolic function is normal. Tricuspid regurgitation signal is inadequate for assessing PA pressure. The tricuspid regurgitant velocity is 2.28 m/s, and with an assumed right atrial pressure of 3 mmHg, the estimated right ventricular systolic pressure is 23.8 mmHg. Left Atrium: Left atrial size was normal in size. Right Atrium: Right atrial size was normal in size. Pericardium: There is no evidence of pericardial effusion. Mitral Valve: The mitral valve is normal in structure. No evidence of mitral valve regurgitation. No evidence of mitral valve stenosis. Tricuspid Valve: The tricuspid valve is normal in structure. Tricuspid valve regurgitation is trivial. No evidence of tricuspid stenosis. Aortic Valve: The aortic valve is tricuspid. Aortic valve regurgitation is trivial. No aortic stenosis is present. Pulmonic Valve: The pulmonic valve was normal in structure. Pulmonic valve regurgitation is not visualized. No evidence of pulmonic stenosis. Aorta: The aortic root is normal in size and structure. Venous: The inferior vena cava is normal in size with greater than 50% respiratory variability, suggesting right atrial pressure of 3 mmHg. IAS/Shunts: No atrial level shunt detected by color flow Doppler.  LEFT VENTRICLE PLAX 2D LVIDd:         4.20 cm  Diastology LVIDs:         2.70 cm  LV e' medial:  7.83 cm/s LV PW:         1.00 cm  LV e' lateral: 9.14 cm/s LV IVS:        0.90 cm LVOT diam:     2.00 cm LVOT Area:     3.14 cm  RIGHT VENTRICLE             IVC RV S prime:     13.50 cm/s  IVC diam: 1.80 cm TAPSE (M-mode): 1.9 cm LEFT ATRIUM             RIGHT ATRIUM LA diam:        3.40 cm RA Area:     11.80 cm LA Vol  (A2C):   26.6 ml RA Volume:   27.20 ml LA Vol (A4C):   43.0 ml LA Biplane Vol: 36.6 ml   AORTA Ao Root diam: 3.10 cm Ao Asc diam:  3.20 cm TRICUSPID VALVE TR Peak grad:   20.8 mmHg TR Vmax:        228.00 cm/s  SHUNTS Systemic Diam: 2.00 cm Olga Millers MD Electronically signed by Olga Millers MD Signature Date/Time: 04/28/2020/11:59:22 AM    Final    VAS [place] LOWER EXTREMITY VENOUS (DVT)  Result Date: 04/28/2020  Lower Venous DVT Study Indications: Stroke.  Risk Factors: None identified. Comparison Study: No previous Performing Technologist: Clint Guy  RVT  Examination Guidelines: A complete evaluation includes B-mode imaging, spectral Doppler, color Doppler, and power Doppler as needed of all accessible portions of each vessel. Bilateral testing is considered an integral part of a complete examination. Limited examinations for reoccurring indications may be performed as noted. The reflux portion of the exam is performed with the patient in reverse Trendelenburg.  +---------+---------------+---------+-----------+----------+--------------+ RIGHT    CompressibilityPhasicitySpontaneityPropertiesThrombus Aging +---------+---------------+---------+-----------+----------+--------------+ CFV      Full           Yes      Yes                                 +---------+---------------+---------+-----------+----------+--------------+ SFJ      Full                                                        +---------+---------------+---------+-----------+----------+--------------+ FV Prox  Full                                                        +---------+---------------+---------+-----------+----------+--------------+ FV Mid   Full                                                        +---------+---------------+---------+-----------+----------+--------------+ FV DistalFull                                                         +---------+---------------+---------+-----------+----------+--------------+ PFV      Full                                                        +---------+---------------+---------+-----------+----------+--------------+ POP      Full           Yes      Yes                                 +---------+---------------+---------+-----------+----------+--------------+ PTV      Full                                                        +---------+---------------+---------+-----------+----------+--------------+ PERO     Full                                                        +---------+---------------+---------+-----------+----------+--------------+   +---------+---------------+---------+-----------+----------+--------------+  LEFT     CompressibilityPhasicitySpontaneityPropertiesThrombus Aging +---------+---------------+---------+-----------+----------+--------------+ CFV      Full           Yes      Yes                                 +---------+---------------+---------+-----------+----------+--------------+ SFJ      Full                                                        +---------+---------------+---------+-----------+----------+--------------+ FV Prox  Full                                                        +---------+---------------+---------+-----------+----------+--------------+ FV Mid   Full                                                        +---------+---------------+---------+-----------+----------+--------------+ FV DistalFull                                                        +---------+---------------+---------+-----------+----------+--------------+ PFV      Full                                                        +---------+---------------+---------+-----------+----------+--------------+ POP      Full           Yes      Yes                                  +---------+---------------+---------+-----------+----------+--------------+ PTV      Full                                                        +---------+---------------+---------+-----------+----------+--------------+ PERO     Full                                                        +---------+---------------+---------+-----------+----------+--------------+     Summary: BILATERAL: - No evidence of deep vein thrombosis seen in the lower extremities, bilaterally. -No evidence of popliteal cyst, bilaterally.   *See table(s) above for measurements and observations.    Preliminary     Scheduled Meds: .  stroke:  mapping our early stages of recovery book   Does not apply Once  . aspirin EC  81 mg Oral Daily  . atorvastatin  40 mg Oral Daily  . clopidogrel  75 mg Oral Daily  . enoxaparin (LOVENOX) injection  40 mg Subcutaneous Q24H  . [START ON 04/29/2020] influenza vac split quadrivalent PF  0.5 mL Intramuscular Tomorrow-1000  . nicotine  14 mg Transdermal Daily   Continuous Infusions: PRN Meds: acetaminophen **OR** acetaminophen (TYLENOL) oral liquid 160 mg/5 mL **OR** acetaminophen, ondansetron (ZOFRAN) IV, senna-docusate  Time spent: 35 minutes  Author: Lynden Oxford, MD [facility] 04/28/2020 6:00 PM  To reach On-call, see care teams to locate the attending and reach out via [email or web address]. Between 7PM-7AM, please contact night-coverage If you still have difficulty reaching the attending provider, please page the [facility] (Director on Call) for [facility] on amion for assistance.

## 2020-04-28 NOTE — ED Notes (Signed)
Sister- Arhan Mcmanamon updated

## 2020-04-28 NOTE — Progress Notes (Signed)
  Echocardiogram 2D Echocardiogram has been performed.  Timothy Baker 04/28/2020, 8:46 AM

## 2020-04-28 NOTE — Progress Notes (Signed)
    [facility] has been requested to perform a transesophageal echocardiogram on Timothy Baker for Stroke .  After careful review of history and examination, the risks and benefits of transesophageal echocardiogram have been explained including risks of esophageal damage, perforation (1:10,000 risk), bleeding, pharyngeal hematoma as well as other potential complications associated with conscious sedation including aspiration, arrhythmia, respiratory failure and death. Alternatives to treatment were discussed, questions were answered. Patient is willing to proceed.  TEE - 04/29/20  @ 1330 . NPO after midnight. Meds with sips.   Manson Passey, PA-C 04/28/2020 4:10 PM

## 2020-04-28 NOTE — ED Notes (Signed)
To Vascular for 2D Echo and doppler studies

## 2020-04-28 NOTE — Plan of Care (Signed)
Received pt from ED around 1538. Pt A&Ox4. NIH and assessment as charted. VSS on RA. Denies pain.  Problem: Education: Goal: Knowledge of General Education information will improve Description: Including pain rating scale, medication(s)/side effects and non-pharmacologic comfort measures Outcome: Progressing   Problem: Health Behavior/Discharge Planning: Goal: Ability to manage health-related needs will improve Outcome: Progressing   Problem: Clinical Measurements: Goal: Ability to maintain clinical measurements within normal limits will improve Outcome: Progressing Goal: Will remain free from infection Outcome: Progressing Goal: Diagnostic test results will improve Outcome: Progressing Goal: Respiratory complications will improve Outcome: Progressing Goal: Cardiovascular complication will be avoided Outcome: Progressing   Problem: Activity: Goal: Risk for activity intolerance will decrease Outcome: Progressing   Problem: Nutrition: Goal: Adequate nutrition will be maintained Outcome: Progressing   Problem: Coping: Goal: Level of anxiety will decrease Outcome: Progressing   Problem: Elimination: Goal: Will not experience complications related to bowel motility Outcome: Progressing Goal: Will not experience complications related to urinary retention Outcome: Progressing   Problem: Pain Managment: Goal: General experience of comfort will improve Outcome: Progressing   Problem: Safety: Goal: Ability to remain free from injury will improve Outcome: Progressing   Problem: Skin Integrity: Goal: Risk for impaired skin integrity will decrease Outcome: Progressing   Problem: Education: Goal: Knowledge of disease or condition will improve Outcome: Progressing Goal: Knowledge of secondary prevention will improve Outcome: Progressing Goal: Knowledge of patient specific risk factors addressed and post discharge goals established will improve Outcome: Progressing Goal:  Individualized Educational Video(s) Outcome: Progressing   Problem: Coping: Goal: Will verbalize positive feelings about self Outcome: Progressing Goal: Will identify appropriate support needs Outcome: Progressing   Problem: Health Behavior/Discharge Planning: Goal: Ability to manage health-related needs will improve Outcome: Progressing   Problem: Self-Care: Goal: Ability to participate in self-care as condition permits will improve Outcome: Progressing Goal: Verbalization of feelings and concerns over difficulty with self-care will improve Outcome: Progressing Goal: Ability to communicate needs accurately will improve Outcome: Progressing   Problem: Nutrition: Goal: Risk of aspiration will decrease Outcome: Progressing Goal: Dietary intake will improve Outcome: Progressing   Problem: Ischemic Stroke/TIA Tissue Perfusion: Goal: Complications of ischemic stroke/TIA will be minimized Outcome: Progressing

## 2020-04-28 NOTE — Progress Notes (Signed)
Bilateral lower extremity venous study completed.      Please see CV Proc for preliminary results.   Jenae Tomasello, RVT  

## 2020-04-29 ENCOUNTER — Encounter (HOSPITAL_COMMUNITY): Payer: Self-pay | Admitting: Anesthesiology

## 2020-04-29 ENCOUNTER — Encounter (HOSPITAL_COMMUNITY): Payer: Self-pay | Admitting: Internal Medicine

## 2020-04-29 ENCOUNTER — Other Ambulatory Visit (HOSPITAL_COMMUNITY): Payer: BC Managed Care – PPO

## 2020-04-29 ENCOUNTER — Encounter (HOSPITAL_COMMUNITY)
Admission: EM | Disposition: A | Discharge status: Home / Self Care | Payer: Self-pay | Source: Home / Self Care | Attending: Internal Medicine

## 2020-04-29 LAB — HOMOCYSTEINE: Homocysteine: 9 umol/L (ref 0.0–14.5)

## 2020-04-29 LAB — BETA-2-GLYCOPROTEIN I ABS, IGG/M/A
Beta-2 Glyco I IgG: <9 GPI IgG units (ref 0–20)
Beta-2-Glycoprotein I IgA: <9 GPI IgA units (ref 0–25)
Beta-2-Glycoprotein I IgM: <9 GPI IgM units (ref 0–32)

## 2020-04-29 LAB — ANTINUCLEAR ANTIBODIES, IFA: ANA Ab, IFA: NEGATIVE

## 2020-04-29 LAB — LUPUS ANTICOAGULANT PANEL
DRVVT: 42.8 s (ref 0.0–47.0)
PTT Lupus Anticoagulant: 38.3 s (ref 0.0–51.9)

## 2020-04-29 SURGERY — CANCELLED PROCEDURE

## 2020-04-29 NOTE — Progress Notes (Signed)
Inpatient Rehabilitation Admissions Coordinator   Patient progressing well with therapy and now using cane with min to min guard assist 300 feet . PT and OT continue to recommend CIR, but patient likely to progress quickly not to need CIR level rehab. I will place rehab consult for full assessment per protocol.  Ottie Glazier, RN, MSN Rehab Admissions Coordinator [phone] 04/29/2020 1:54 PM

## 2020-04-29 NOTE — Evaluation (Signed)
Speech Language Pathology Evaluation Patient Details Name: Timothy Baker MRN: [id number] DOB: 09-05-1960 Today's Date: 04/29/2020 Time: 8413-2440 SLP Time Calculation (min) (ACUTE ONLY): 21 min  Problem List:  Patient Active Problem List   Diagnosis Date Noted  . Acute CVA (cerebrovascular accident) (HCC) 04/27/2020  . Tobacco dependence due to cigarettes 04/27/2020   Past Medical History:  Past Medical History:  Diagnosis Date  . Polysubstance abuse (HCC)    quit in 2019 - used ETOH, crack cocaine   Past Surgical History:  Past Surgical History:  Procedure Laterality Date  . genital surgery     HPI:  Pt is a 60 y.o. male with PMHx substance abuse, HTN, HLD, tobacco use. Pt presented with stroke-like symptoms including L-sided weakness, dyspahgia, and dysarthria. CT head 2/7: acute infarct involving a portion of the anterior limb of the right internal capsule and immediately adjacent right centrum semiovale. MRI brain 2/7: Patchy multifocal acute ischemic nonhemorrhagic infarcts involving the periventricular and deep white matter of the right corona radiata and centrum semi ovale, with involvement of the underlying right basal ganglia.   Assessment / Plan / Recommendation Clinical Impression  Pt participated in speech/language/cognition evaluation . Pt denied any baseline deficits in language, or cognition, but stated that he typically speaks quickly and stutters. 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 Pt described his speech as "a bit slurred", but he denied any other changes in the aforementioned areas. [facility] Mental Status Examination was completed to evaluate the pt's cognitive-linguistic skills. He achieved a score of 21/30 which is below the normal limits of 27 or more out of 30 and is suggestive of a mild impairment. He exhibited deficits in the areas of awareness, attention, memory, complex problem  solving, and executive function. He also presented with mild dysarthria characterized by reduced articulatory precision which negatively impacted speech intelligibility at the sentence and conversational levels. Skilled SLP services are clinically indicated at this time to improve motor speech and cognitive-linguistic function.    SLP Assessment  SLP Recommendation/Assessment: Patient needs continued Speech Lanaguage Pathology Services SLP Visit Diagnosis: Dysarthria and anarthria (R47.1);Cognitive communication deficit (R41.841)    Follow Up Recommendations  Inpatient Rehab    Frequency and Duration min 2x/week  2 weeks      SLP Evaluation Cognition  Overall Cognitive Status: Impaired/Different from baseline Orientation Level: Oriented X4 Attention: Focused;Sustained Focused Attention: Appears intact Memory: Impaired Memory Impairment: Retrieval deficit;Decreased recall of new information;Decreased short term memory Decreased Short Term Memory:  (Immediate: 5/5; delayed: 4/5; with cue:0/1) Awareness: Impaired Awareness Impairment: Emergent impairment Problem Solving: Impaired Executive Function: Reasoning;Organizing;Sequencing Sequencing: Impaired Sequencing Impairment: Verbal complex (clock drawing 4/4; oterh) Organizing: Impaired Organizing Impairment: Verbal complex (Backward digit span: 1/2)       Comprehension  Auditory Comprehension Overall Auditory Comprehension: Appears within functional limits for tasks assessed Yes/No Questions: Within Functional Limits Commands: Within Functional Limits Interfering Components: Working memory    Expression Expression Primary Mode of Expression: Verbal Verbal Expression Overall Verbal Expression: Appears within functional limits for tasks assessed Initiation: No impairment Level of Generative/Spontaneous Verbalization: Conversation Repetition: No impairment Naming: No impairment Pragmatics: Impairment Impairments: Abnormal  affect   Oral / Motor  Oral Motor/Sensory Function Overall Oral Motor/Sensory Function: Mild impairment Facial ROM: Reduced left;Suspected CN VII (facial) dysfunction Facial Symmetry: Abnormal symmetry left;Suspected CN VII (facial) dysfunction Facial Strength: Reduced left;Suspected CN VII (facial) dysfunction Facial Sensation: Within Functional Limits Lingual ROM: Reduced  left;Reduced right;Suspected CN XII (hypoglossal) dysfunction Lingual Symmetry: Within Functional Limits Lingual Strength: Reduced;Suspected CN XII (hypoglossal) dysfunction Lingual Sensation: Within Functional Limits Motor Speech Overall Motor Speech: Impaired Respiration: Within functional limits Phonation: Normal Resonance: Within functional limits Articulation: Impaired Intelligibility: Intelligibility reduced Word: 75-100% accurate Phrase: 75-100% accurate Sentence: 50-74% accurate Conversation: 50-74% accurate Motor Planning: Witnin functional limits Motor Speech Errors: Aware;Consistent   Maricarmen Braziel I. Vear Clock, MS, CCC-SLP Acute Rehabilitation Services Office number [phone] Pager [phone]           Scheryl Marten 04/29/2020, 2:43 PM

## 2020-04-29 NOTE — Progress Notes (Signed)
Physical Therapy Treatment Patient Details Name: Timothy Baker MRN: [id number] DOB: [birth year] Today's Date: 04/29/2020    History of Present Illness 61 yo male with onset of L side weakness sustained a fall climbing up to change smoke detector battery on 2/5, then on 2/6 came to ED with significant changes in his L side facially and in L extremities.  On imaging his notes were that the infarction was in R anterior limb of internal capsule.  Pt has memory changes, coordination changes and impulsive behavior.  PMHx:  substance abuse, HTN, HLD, tobacco use,    PT Comments    Pt making progress with mobility, focusing session on improving safe gait pattern and educating pt on use of SPC. Pt continues to be impulsive and display some memory deficits in that he needs repeated cues to remain on task and maintain his safety. Pt demonstrates decreased L foot advancement and bil feet clearance with gait along with unsteadiness resulting in x1 LOB laterally to the R this date, placing him at risk for falls and injuries. He varies from requiring min guard-minA for mobility due to his instability. However, the [facility] appears to be providing him further support and he displayed improved sequencing as distance progressed, thus recommending SPC upon d/c at this time. Will continue to follow acutely. Pt would greatly benefit from intensive therapies in the CIR setting to maximize his independence and safety with all functional mobility as he is very motivated, can tolerate increased sessions, and is at risk for falls due to impairments physically and cognitively.    Follow Up Recommendations  CIR     Equipment Recommendations  Cane [facility] - may change with progress)    Recommendations for Other Services Rehab consult     Precautions / Restrictions Precautions Precautions: Fall Precaution Comments: stands with quick movements, not in control Restrictions Weight Bearing Restrictions: No    Mobility  Bed  Mobility Overal bed mobility: Needs Assistance Bed Mobility: Supine to Sit     Supine to sit: Supervision Sit to supine: Min guard   General bed mobility comments: Pt able to come to sit R EOB with supervision for safety.  Transfers Overall transfer level: Needs assistance Equipment used: Straight cane Transfers: Sit to/from Stand Sit to Stand: Min guard         General transfer comment: Min guard for safety as pt is impulsive, quickly coming to stand.  Ambulation/Gait Ambulation/Gait assistance: Min guard;Min assist Gait Distance (Feet): 300 Feet Assistive device: Straight cane Gait Pattern/deviations: Step-to pattern;Step-through pattern;Decreased stride length;Decreased weight shift to left;Decreased step length - left;Decreased dorsiflexion - right;Decreased dorsiflexion - left (mild LLE Circumduction) Gait velocity: reduced Gait velocity interpretation: <1.8 ft/sec, indicate of risk for recurrent falls General Gait Details: Demonstrated proper 2-point gait pattern with SPC in R hand prior to coming to stand. Pt required repeated cues to keep R SPC in sync with L foot, but pattern improved with decreased cues as distance progressed. Pt displays decreased L step length and bil feet clearance, providing verbal and visual cues to improve, with mod success but repeated cues for carryover. 1 LOB towards R with minA to recover, wavering from min guard-A for stability throughout.   Stairs             Wheelchair Mobility    Modified Rankin (Stroke Patients Only) Modified Rankin (Stroke Patients Only) Pre-Morbid Rankin Score: No symptoms Modified Rankin: Moderate disability     Balance Overall balance assessment: Needs assistance Sitting-balance support: Feet  supported Sitting balance-Leahy Scale: Good Sitting balance - Comments: Sitting EOB no LOB, supervision for safety.   Standing balance support: No upper extremity supported Standing balance-Leahy Scale:  Fair Standing balance comment: Able to stand statically without UE support and needs 1 UE support to reach moderately off BOS.                            Cognition Arousal/Alertness: Awake/alert Behavior During Therapy: Impulsive Overall Cognitive Status: Impaired/Different from baseline Area of Impairment: Awareness;Safety/judgement;Attention;Memory;Following commands                 Orientation Level: Situation Current Attention Level: Selective Memory: Decreased short-term memory Following Commands: Follows one step commands inconsistently;Follows one step commands with increased time Safety/Judgement: Decreased awareness of deficits;Decreased awareness of safety Awareness: Intellectual Problem Solving: Slow processing;Requires verbal cues;Requires tactile cues General Comments: Pt impulsive and unaware of lines/leads positioning to ensure safety, needing cues throughout. Pt with STM and attention deficits as cues need to be repeated to make changes during mobility.      Exercises      General Comments        Pertinent Vitals/Pain Pain Assessment: No/denies pain    Home Living Family/patient expects to be discharged to:: Private residence Living Arrangements: Alone Available Help at Discharge: Family;Friend(s);Available PRN/intermittently Type of Home: House Home Access: Stairs to enter Entrance Stairs-Rails: Can reach both Home Layout: One level Home Equipment: None Additional Comments: has been working as a Museum/gallery exhibitions officer and walks with no AD normally    Prior Function Level of Independence: Independent      Comments: work as a Chief Strategy Officer (current goals can now be found in the care plan section) Acute Rehab PT Goals Patient Stated Goal: to go to CIR and improve PT Goal Formulation: With patient Potential to Achieve Goals: Good Progress towards PT goals: Progressing toward goals    Frequency    Min 4X/week      PT  Plan Current plan remains appropriate;Equipment recommendations need to be updated    Co-evaluation              AM-PAC PT "6 Clicks" Mobility   Outcome Measure  Help needed turning from your back to your side while in a flat bed without using bedrails?: None Help needed moving from lying on your back to sitting on the side of a flat bed without using bedrails?: None Help needed moving to and from a bed to a chair (including a wheelchair)?: A Little Help needed standing up from a chair using your arms (e.g., wheelchair or bedside chair)?: A Little Help needed to walk in hospital room?: A Little Help needed climbing 3-5 steps with a railing? : Total 6 Click Score: 18    End of Session Equipment Utilized During Treatment: Gait belt Activity Tolerance: Patient tolerated treatment well Patient left: with nursing/sitter in room;in chair (with transporter to TEE)   PT Visit Diagnosis: Unsteadiness on feet (R26.81);Muscle weakness (generalized) (M62.81);Difficulty in walking, not elsewhere classified (R26.2);Hemiplegia and hemiparesis;Other abnormalities of gait and mobility (R26.89);Other symptoms and signs involving the nervous system (R29.898) Hemiplegia - Right/Left: Left Hemiplegia - dominant/non-dominant: Non-dominant Hemiplegia - caused by: Cerebral infarction     Time: 1194-1740 PT Time Calculation (min) (ACUTE ONLY): 11 min  Charges:  $Gait Training: 8-22 mins  Raymond Gurney, PT, DPT Acute Rehabilitation Services  Pager: [phone] Office: [phone]    Jewel Baize 04/29/2020, 1:15 PM

## 2020-04-29 NOTE — Progress Notes (Signed)
  Speech Language Pathology Treatment: Cognitive-Linquistic (Dysarthria)  Patient Details Name: Timothy Baker MRN: [id number] DOB: September 26, 1960 Today's Date: 04/29/2020 Time: 5465-0354 SLP Time Calculation (min) (ACUTE ONLY): 17 min  Assessment / Plan / Recommendation Clinical Impression  Pt was seen for dysarthria treatment and was cooperative during the session. He was educated regarding the nature of dysarthria, and compensatory strategies to improve speech intelligibility. Pt verbalized understanding regarding all areas of education. Dysarthria handout was provided, but pt's glasses were not at the hospital and he could not read it due to the font size. Pt stated that he will have his sister bring his glassess so that he can read it later. He used compensatory strategies at the word level with 80% accuracy increasing to 100% with cues. At the phrase level he demonstrated with 60% accuracy increasing to 100% accuracy with cues for overarticulation and rate. He required frequent cues during conversation and clarification was often necessary. SLP will continue to follow pt.    HPI HPI: Pt is a 60 y.o. male with PMHx substance abuse, HTN, HLD, tobacco use. Pt presented with stroke-like symptoms including L-sided weakness, dyspahgia, and dysarthria. CT head 2/7: acute infarct involving a portion of the anterior limb of the right internal capsule and immediately adjacent right centrum semiovale. MRI brain 2/7: Patchy multifocal acute ischemic nonhemorrhagic infarcts involving the periventricular and deep white matter of the right corona radiata and centrum semi ovale, with involvement of the underlying right basal ganglia.      SLP Plan  Continue with current plan of care  Patient needs continued Speech Lanaguage Pathology Services    Recommendations                   Oral Care Recommendations: Oral care BID Follow up Recommendations: Inpatient Rehab SLP Visit Diagnosis: Dysarthria and  anarthria (R47.1);Cognitive communication deficit [phone]) Plan: Continue with current plan of care       GO                Scheryl Marten 04/29/2020, 2:50 PM

## 2020-04-29 NOTE — Progress Notes (Signed)
[facility] Progress Note  Patient: Timothy Baker    FGH:[id number]  DOA: 04/27/2020     Date of Service: the patient was seen and examined on 04/29/2020  Brief hospital course: No significant past medical history presents with complaints of difficulty speaking and left-sided weakness.  Found to have acute CVA.  Neurology following. Currently plan is for the stroke work-up.  Assessment and Plan: 1.  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 Out of the window.  For TPA. CT A negative for any large vessel occlusion. Echocardiogram showed preserved EF. Lower extremity Doppler negative for DVT. Neurology recommended embolic work-up including a TEE but due to poor dentition currently TEE is postponed in outpatient area until patient has follow-up with dentistry. Loop recorder recommended.  We will follow up on cardiology recommendation. LDL 107. Hemoglobin A1c 5.9. Now on 81 mg aspirin as well as Plavix. Continue DAPT for 3 weeks and then aspirin alone. Also on Lipitor. Currently PT recommended CIR although the patient appears to be progressing well and therefore likely will not need CIR. Home with home health most likely needed.  2.  Hypertension Currently allowing permissive hypertension. Monitor.  3.  HLD On statin.  Monitor.  Lipitor 40.  4.  Active smoker Continue nicotine patch. Patient willing to quit right now. Counseling provided.  Diet: Cardiac diet DVT Prophylaxis:   enoxaparin (LOVENOX) injection 40 mg Start: 04/27/20 1600  Advance goals of care discussion: Full code  Family Communication: no family was present at bedside, at the time of interview.   Disposition:  Status is: Inpatient  Remains inpatient appropriate because:Ongoing diagnostic testing needed not appropriate for outpatient work up  Dispo: The patient is from: Home              Anticipated d/c is to: Home              Anticipated d/c date is: 2 days               Patient currently is not medically stable to d/c.   Difficult to place patient   Subjective: No nausea no vomiting.  No fever no chills.  No further complaints.  Physical Exam:  General: Appear in mild distress, no Rash; Oral Mucosa Clear, moist. no Abnormal Neck Mass Or lumps, Conjunctiva normal  Cardiovascular: S1 and S2 Present, no Murmur, Respiratory: good respiratory effort, Bilateral Air entry present and CTA, no Crackles, no wheezes Abdomen: Bowel Sound present, Soft and no tenderness Extremities: no Pedal edema Neurology: alert and oriented to time, place, and person affect appropriate.  Left-sided weakness improving.  Right-sided facial droop still present.  No focal deficit other than that. Gait not checked due to patient safety concerns  Vitals:   04/29/20 1205 04/29/20 1306 04/29/20 1544 04/29/20 1549  BP: 122/79 130/79 138/83 121/83  Pulse: 62 64 65 90  Resp: 13 18 18 16   Temp: 97.9 F (36.6 C) 98.1 F (36.7 C) 98.1 F (36.7 C) 98.7 F (37.1 C)  TempSrc: Temporal Oral Oral Oral  SpO2: 99% 100% 100% 99%  Weight: 88.4 kg     Height: 6\' 3"  (1.905 m)       Intake/Output Summary (Last 24 hours) at 04/29/2020 1621 Last data filed at 04/29/2020 1452 Gross per 24 hour  Intake 488.4 ml  Output 550 ml  Net -61.6 ml   Filed Weights   04/28/20 1538 04/29/20 1205  Weight: 88.4 kg 88.4 kg    Data Reviewed: I  have personally reviewed and interpreted daily labs, tele strips, imaging. I reviewed all nursing notes, pharmacy notes, vitals, pertinent old records I have discussed plan of care as described above with RN and patient/family.  CBC: Recent Labs  Lab 04/27/20 1020  WBC 10.7*  NEUTROABS 7.2  HGB 15.4  HCT 46.5  MCV 94.1  PLT 212   Basic Metabolic Panel: Recent Labs  Lab 04/27/20 1020  NA 140  K 3.5  CL 107  CO2 22  GLUCOSE 118*  BUN 9  CREATININE 0.99  CALCIUM 9.0    Studies: No results found.  Scheduled Meds: .  stroke: mapping our  early stages of recovery book   Does not apply Once  . aspirin EC  81 mg Oral Daily  . atorvastatin  40 mg Oral Daily  . clopidogrel  75 mg Oral Daily  . enoxaparin (LOVENOX) injection  40 mg Subcutaneous Q24H  . nicotine  14 mg Transdermal Daily   Continuous Infusions: PRN Meds: acetaminophen **OR** acetaminophen (TYLENOL) oral liquid 160 mg/5 mL **OR** acetaminophen, ondansetron (ZOFRAN) IV, senna-docusate  Time spent: 35 minutes  Author: Lynden Oxford, MD [facility] 04/29/2020 4:21 PM  To reach On-call, see care teams to locate the attending and reach out via [email or web address]. Between 7PM-7AM, please contact night-coverage If you still have difficulty reaching the attending provider, please page the [facility] (Director on Call) for [facility] on amion for assistance.

## 2020-04-29 NOTE — Progress Notes (Signed)
Pt transported off unit to endo for TEE. Dionne Bucy RN

## 2020-04-29 NOTE — Progress Notes (Signed)
Initial Nutrition Assessment  DOCUMENTATION CODES:   Not applicable  INTERVENTION:   Recommend liberalizing diet to regular.   Ensure Enlive po TID, each supplement provides 350 kcal and 20 grams of protein  Magic cup TID with meals, each supplement provides 290 kcal and 9 grams of protein  MVI with minerals daily    NUTRITION DIAGNOSIS:   Predicted suboptimal nutrient intake related to acute illness (acute CVA) as evidenced by other (comment) (c/s to assess nutrition status/requirements).    GOAL:   Patient will meet greater than or equal to 90% of their needs    MONITOR:   PO intake,Supplement acceptance,Weight trends,Labs,I & O's  REASON FOR ASSESSMENT:   Consult Assessment of nutrition requirement/status  ASSESSMENT:   Pt with no significant PMH admitted with acute CVA (out of tPA window).  Pt denies N/V. No PO intake documented by RN. Will order oral nutrition supplements to provide pt with additional kcals/protein.   No wt history available to review.  UOP: x24 hours   Labs and medications reviewed.   NUTRITION - FOCUSED PHYSICAL EXAM: Unable to perform at this time. Will attempt at follow-up.   Diet Order:   Diet Order            Diet Heart Room service appropriate? Yes; Fluid consistency: Thin  Diet effective now                 EDUCATION NEEDS:   No education needs have been identified at this time  Skin:  Skin Assessment: Reviewed RN Assessment  Last BM:  2/9  Height:   Ht Readings from Last 1 Encounters:  04/29/20 6\' 3"  (1.905 m)    Weight:   Wt Readings from Last 1 Encounters:  04/29/20 88.4 kg   BMI:  Body mass index is 24.36 kg/m.  Estimated Nutritional Needs:   Kcal:  2200-2400  Protein:  110-120 grams  Fluid:  >2L    06/27/20, MS, RD, LDN RD pager number and weekend/on-call pager number located in Amion.

## 2020-04-29 NOTE — Anesthesia Preprocedure Evaluation (Deleted)
Anesthesia Evaluation    Reviewed: Allergy & Precautions, Patient's Chart, lab work & pertinent test results  History of Anesthesia Complications Negative for: history of anesthetic complications  Airway        Dental   Pulmonary Current Smoker,           Cardiovascular negative cardio ROS     '22 TTE -Trivial AI, otherwise normal echo    Neuro/Psych CVA negative psych ROS   GI/Hepatic negative GI ROS, (+)     substance abuse  alcohol use and cocaine use,   Endo/Other  negative endocrine ROS  Renal/GU negative Renal ROS     Musculoskeletal negative musculoskeletal ROS (+)   Abdominal   Peds  Hematology negative hematology ROS (+)   Anesthesia Other Findings Covid test negative   Reproductive/Obstetrics                             Anesthesia Physical Anesthesia Plan  ASA: IV  Anesthesia Plan: MAC   Post-op Pain Management:    Induction: Intravenous  PONV Risk Score and Plan: 1 and Propofol infusion and Treatment may vary due to age or medical condition  Airway Management Planned: Nasal Cannula and Natural Airway  Additional Equipment: None  Intra-op Plan:   Post-operative Plan:   Informed Consent:   Plan Discussed with: CRNA and Anesthesiologist  Anesthesia Plan Comments:         Anesthesia Quick Evaluation

## 2020-04-29 NOTE — Evaluation (Signed)
Occupational Therapy Evaluation Patient Details Name: Timothy Baker MRN: [id number] DOB: 04-02-1960 Today's Date: 04/29/2020    History of Present Illness 60 yo male with onset of L side weakness sustained a fall climbing up to change smoke detector battery on 2/5, then on 2/6 came to ED with significant changes in his L side facially and in L extremities.  On imaging his notes were that the infarction was in R anterior limb of internal capsule.  Pt has memory changes, coordination changes and impulsive behavior.  PMHx:  substance abuse, HTN, HLD, tobacco use,   Clinical Impression   PTA, pt was living at home alone, pt was independent with ADL/IADL and functional mobility. Pt was working as a Museum/gallery exhibitions officer. He does endorse previous falls while at work. Difficult to gather thorough information regarding falls due to expressive limitations. Pt demonstrates cognitive limitations (see cognition section) that impact his safety with mobility and self-care. Due to cognitive limitations, LUE and LLE weakness and decreased coordination, instability and fall risk pt requires minguard assistance for grooming, toilet transfer and functional mobility. Due to decline in current level of function, pt would benefit from acute OT to address established goals to facilitate safe D/C to venue listed below. At this time, recommend CIR follow-up. Will continue to follow acutely.     Follow Up Recommendations  CIR    Equipment Recommendations  None recommended by OT    Recommendations for Other Services       Precautions / Restrictions Precautions Precautions: Fall Precaution Comments: stands with quick movements, not in control Restrictions Weight Bearing Restrictions: No      Mobility Bed Mobility Overal bed mobility: Needs Assistance Bed Mobility: Supine to Sit     Supine to sit: Min guard Sit to supine: Min guard   General bed mobility comments: minguard for safety    Transfers Overall  transfer level: Needs assistance Equipment used: 1 person hand held assist Transfers: Sit to/from Stand Sit to Stand: Min guard         General transfer comment: cues for safety and stability    Balance Overall balance assessment: Needs assistance Sitting-balance support: Feet supported Sitting balance-Leahy Scale: Good     Standing balance support: During functional activity;No upper extremity supported Standing balance-Leahy Scale: Fair Standing balance comment: instability noted, pt unable to tolerate challenge                           ADL either performed or assessed with clinical judgement   ADL Overall ADL's : Needs assistance/impaired Eating/Feeding: Independent   Grooming: Min guard;Standing Grooming Details (indicate cue type and reason): completed at sink level, demonstrate decreased fine motor and gross motor coordination in LUE Upper Body Bathing: Set up;Sitting   Lower Body Bathing: Min guard;Sit to/from stand   Upper Body Dressing : Minimal assistance;Sitting Upper Body Dressing Details (indicate cue type and reason): pt would require assistance with fasteners Lower Body Dressing: Min guard;Sit to/from stand   Toilet Transfer: Min guard;Ambulation Toilet Transfer Details (indicate cue type and reason): simulated in room Toileting- Clothing Manipulation and Hygiene: Min guard       Functional mobility during ADLs: Min guard General ADL Comments: pt demonstrates decrased safety awareness, instability and LUE/LLE weakness     Vision Baseline Vision/History: No visual deficits Patient Visual Report: No change from baseline Vision Assessment?: No apparent visual deficits     Perception     Praxis  Pertinent Vitals/Pain Pain Assessment: No/denies pain     Hand Dominance Left   Extremity/Trunk Assessment Upper Extremity Assessment Upper Extremity Assessment: LUE deficits/detail LUE Deficits / Details: grossly 3+/5 noted pronator  drift;decreased fine motor and gross motor coordination   Lower Extremity Assessment Lower Extremity Assessment: LLE deficits/detail LLE Deficits / Details: reduced strength and mild incoordination LLE Coordination: decreased gross motor   Cervical / Trunk Assessment Cervical / Trunk Assessment: Normal   Communication Communication Communication: Expressive difficulties   Cognition Arousal/Alertness: Awake/alert Behavior During Therapy: Impulsive Overall Cognitive Status: Impaired/Different from baseline Area of Impairment: Awareness;Safety/judgement;Attention                 Orientation Level: Situation Current Attention Level: Selective Memory: Decreased short-term memory Following Commands: Follows one step commands inconsistently;Follows one step commands with increased time Safety/Judgement: Decreased awareness of deficits Awareness: Intellectual Problem Solving: Slow processing;Requires verbal cues;Requires tactile cues General Comments: pt scored 14/28 on short blessed test to screen for cognitive limitations. Pt's score indicate impairment consistent with dementia and warrants further assessment of cognitive functioning. Pt demonstrated limitations with working memory, short term memory, problem solving, and executive functioning skills. Pt demonstrates decreased awareness of limitations and decreased understanding for need for assistance   General Comments       Exercises Exercises: General Lower Extremity General Exercises - Lower Extremity Long Arc Quad: Strengthening;10 reps Heel Slides: Strengthening;10 reps Hip ABduction/ADduction: Strengthening;10 reps   Shoulder Instructions      Home Living Family/patient expects to be discharged to:: Private residence Living Arrangements: Alone Available Help at Discharge: Family;Friend(s);Available PRN/intermittently Type of Home: House Home Access: Stairs to enter [employer] of Steps: 4 Entrance  Stairs-Rails: Can reach both Home Layout: One level         Bathroom Toilet: Standard     Home Equipment: None   Additional Comments: has been working as a Museum/gallery exhibitions officer and walks with no AD normally      Prior Functioning/Environment Level of Independence: Independent        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 Problem List: Decreased activity tolerance;Decreased range of motion;Impaired balance (sitting and/or standing);Decreased safety awareness;Decreased cognition;Decreased knowledge of precautions      OT Treatment/Interventions: Self-care/ADL training;Therapeutic exercise;Energy conservation;DME and/or AE instruction;Therapeutic activities;Patient/family education;Balance training;Cognitive remediation/compensation    OT Goals(Current goals can be found in the care plan section) Acute Rehab OT Goals Patient Stated Goal: to go to CIR and improve OT Goal Formulation: With patient Time For Goal Achievement: 05/13/20 Potential to Achieve Goals: Good ADL Goals Pt Will Perform Grooming: Independently;standing Pt Will Transfer to Toilet: Independently;ambulating Additional ADL Goal #1: Pt will complete multistep ADL task with <3 errors for safe engagment of ADL/IADL and fucntional mobility.  OT Frequency: Min 2X/week   Barriers to D/C:            Co-evaluation              AM-PAC OT "6 Clicks" Daily Activity     Outcome Measure Help from another person eating meals?: None Help from another person taking care of personal grooming?: A Little Help from another person toileting, which includes using toliet, bedpan, or urinal?: A Little Help from another person bathing (including washing, rinsing, drying)?: A Little Help from another person to put on and taking off regular upper body clothing?: A Little Help from another person to put on and taking off regular lower body clothing?: A Little 6  Click Score: 19   End of Session Nurse Communication:  Mobility status  Activity Tolerance: Patient tolerated treatment well Patient left: in bed;with call bell/phone within reach;with bed alarm set  OT Visit Diagnosis: Other abnormalities of gait and mobility (R26.89);Muscle weakness (generalized) (M62.81);Other symptoms and signs involving cognitive function                Time: 4742-5956 OT Time Calculation (min): 19 min Charges:  OT General Charges $OT Visit: 1 Visit OT Evaluation $OT Eval Moderate Complexity: 1 Mod  Murna Backer OTR/L Acute Rehabilitation Services Office: [phone]   Rebeca Alert 04/29/2020, 12:55 PM

## 2020-04-29 NOTE — Plan of Care (Signed)
Personally seen and examined. Briefly 60 yo M with poor dentition and recent stroke. Patient notes no problems with his teeth in the past Exam notable for movement in all extremities, carious upper teeth that can be moved with light tought. Personally reviewed relevant tests; normal echocardiogram (TTE) Reached out to Primary MD and discussed with Neurology MD, Anesthetia MD and patient:  Given risks and benefits may benefit from outpatient bubble study (TTE).  If positive, and after teeth have been fixed, would be appropriate for TEE.  Patient and team in agreement.  Christell Constant, MD

## 2020-04-29 NOTE — Progress Notes (Signed)
TEE cancelled per Dr. Izora Ribas.

## 2020-04-29 NOTE — Progress Notes (Signed)
Pt back to room from endo. Dionne Bucy RN

## 2020-04-29 NOTE — Progress Notes (Signed)
STROKE TEAM PROGRESS NOTE   INTERVAL HISTORY No family at the bedside.  Patient lying in bed, no complaints, still has mild left facial droop and left arm pronator drift. TEE planned today but cancelled due to loose teeth, will schedule as outpt once tooth condition improves to avoid aspiration. Will do TCD bubble study and loop recorder.   Vitals:   04/29/20 0422 04/29/20 0811 04/29/20 1205 04/29/20 1306  BP: 118/69 131/78 122/79 130/79  Pulse: 65 60 62 64  Resp: 18 18 13 18   Temp: 97.9 F (36.6 C) 98 F (36.7 C) 97.9 F (36.6 C) 98.1 F (36.7 C)  TempSrc: Oral Oral Temporal Oral  SpO2: 99% 100% 99% 100%  Weight:   88.4 kg   Height:   6\' 3"  (1.905 m)    CBC:  Recent Labs  Lab 04/27/20 1020  WBC 10.7*  NEUTROABS 7.2  HGB 15.4  HCT 46.5  MCV 94.1  PLT 212   Basic Metabolic Panel:  Recent Labs  Lab 04/27/20 1020  NA 140  K 3.5  CL 107  CO2 22  GLUCOSE 118*  BUN 9  CREATININE 0.99  CALCIUM 9.0   Lipid Panel:  Recent Labs  Lab 04/28/20 0629  CHOL 156  TRIG 74  HDL 34*  CHOLHDL 4.6  VLDL 15  LDLCALC 06/25/20*   HgbA1c:  Recent Labs  Lab 04/28/20 0629  HGBA1C 5.9*   Urine Drug Screen:  Recent Labs  Lab 04/27/20 1635  LABOPIA NONE DETECTED  COCAINSCRNUR NONE DETECTED  LABBENZ NONE DETECTED  AMPHETMU NONE DETECTED  THCU NONE DETECTED  LABBARB NONE DETECTED    Alcohol Level No results for input(s): ETH in the last 168 hours.  IMAGING past 24 hours No results found.  PHYSICAL EXAM  Temp:  [97.9 F (36.6 C)-99 F (37.2 C)] 98.1 F (36.7 C) (02/09 1306) Pulse Rate:  [60-68] 64 (02/09 1306) Resp:  [13-18] 18 (02/09 1306) BP: (118-144)/(69-84) 130/79 (02/09 1306) SpO2:  [99 %-100 %] 100 % (02/09 1306) Weight:  [88.4 kg] 88.4 kg (02/09 1205)  General - Well nourished, well developed, in no apparent distress.  Ophthalmologic - fundi not visualized due to noncooperation.  Cardiovascular - Regular rhythm and rate.  Mental Status -  Level of  arousal and orientation to time, place, and person were intact. Language including expression, naming, repetition, comprehension was assessed and found intact. Fund of Knowledge was assessed and was intact  Cranial Nerves II - XII - II - Visual field intact OU. III, IV, VI - Extraocular movements intact. V - Facial sensation intact bilaterally. VII - left nasolabial fold flattening. VIII - Hearing & vestibular intact bilaterally. X - Palate elevates symmetrically. XI - Chin turning & shoulder shrug intact bilaterally. XII - Tongue protrusion intact.  Motor Strength - The patient's strength was normal in all extremities except pronator drift was present on the left with 4+/5 left finger grip.  Bulk was normal and fasciculations were absent.   Motor Tone - Muscle tone was assessed at the neck and appendages and was normal.  Reflexes - The patient's reflexes were symmetrical in all extremities and he had no pathological reflexes.  Sensory - Light touch, temperature/pinprick were assessed and were decreased light touch sensation on the left  Coordination - The patient had normal movements in the hands with no ataxia or dysmetria although slow on the left.  Tremor was absent.  Gait and Station - deferred.   ASSESSMENT/PLAN  60 year old male presents with  left-sided weakness, left facial droop, and dysarthria for approximately 3 days. Only known medical history is cigarette smoking and reported borderline hypertension. CT head on arrival with evidence of an acute/subacute infarct in a portion of the anterior limb of the right internal capsule and adjacent white matter.  Neurology exam findings are congruent with the location of the stroke seen on CT, but also suggestive of right parietal lobe involvement.    Stroke - patchy multifocal acute infarcts involving the right CR, SO and BG, embolic pattern, source unclear  CT head Recent appearing and suspected acute infarct involving a portion  ofthe anterior limb of the right internal capsule and immediately adjacent right centrum semiovale.    CTA head & neck: Normal CTA of the head and neck.  MRI  Patchy multifocal acute ischemic nonhemorrhagic infarcts involving the periventricular and deep white matter of the rightcorona radiata and centrum semi ovale, with involvement of the underlying right basal ganglia.   2D Echo EF 60 to 65%. No shunt.   LE venous doppler - no DVT  TEE will be done as outpt once pt tooth condition improves to avoid aspiration  TCD bubble study pending  Recommend loop recorder placement to rule out afib  LDL 107  HgbA1c 5.9  UDS neg  Hypercoagulable lab pending  VTE prophylaxis -Lovenox  No antithrombotic prior to admission, now on aspirin 81 mg daily and clopidogrel 75 mg daily DAPT for 3 weeks and then aspirin alone.  Therapy recommendations: Pending  Disposition: Pending  Borderline Hypertension  Home meds:  None . BP stable . Long-term BP goal normotensive  Hyperlipidemia  Home meds:  None  LDL 107, goal < 70  Add Lipitor 40  Continue statin at discharge  Tobacco abuse  Current smoker  Smoking cessation counseling provided  Nicotine patch provided  Pt is willing to quit  Other Stroke Risk Factors  Other Active Problems  Leukocytosis WBC 10.7  Hospital day # 2  Marvel Plan, MD PhD Stroke Neurology 04/29/2020 3:37 PM    To contact Stroke Continuity provider, please refer to [email or web address]. After hours, contact General Neurology

## 2020-04-29 NOTE — Consult Note (Addendum)
Physical Medicine and Rehabilitation Consult Reason for Consult: Left side weakness Referring Physician: [facility]   HPI: Timothy Baker is a 60 y.o. right-handed male with unremarkable past medical history except tobacco abuse and remote history of polysubstance use.  History taken from chart review and patient. Patient lives alone.  1 level home 4 steps to entry.  Works as a Estate agent.  He presented on 04/09/2020 with left hemi-paresis, facial droop and gait abnormality.  He claimed he went to work and coworkers noted that he was not acting right with AMS.  Coworkers expressed concern for progressive dysarthria.  CT/MRI showed patchy multifocal, acute ischemic nonhemorrhagic infarcts involving the periventricular and deep white matter of the right corona radiata and centrum semiovale with involvement of the underlying right basal ganglia.  Patient did not receive TPA.  CT angiogram of head and neck unremarkable.  Admission chemistries unremarkable except glucose 118, urine drug screen negative, urinalysis positive nitrite, WBCs 10, 700.  Echocardiogram with EF of 60-65%, no wall motion abnormalities.  Currently maintained on aspirin and Plavix for CVA prophylaxis x3 weeks then aspirin alone.  Subcutaneous Lovenox for DVT prophylaxis.  Awaiting plans for possible TEE.  Therapy evaluations completed due to patient's left hemiparesis and slurred speech with recommendations of physical medicine rehab consult.  Review of Systems  Constitutional: Negative for chills and fever.  HENT: Negative for hearing loss.   Eyes: Negative for blurred vision and double vision.  Respiratory: Negative for cough and shortness of breath.   Cardiovascular: Negative for chest pain and palpitations.  Gastrointestinal: Positive for constipation. Negative for heartburn, nausea and vomiting.  Genitourinary: Negative for dysuria, flank pain and hematuria.  Musculoskeletal: Positive for myalgias. Negative for  back pain.  Skin: Negative for rash.  Neurological: Positive for speech change, focal weakness and weakness.  All other systems reviewed and are negative.  Past Medical History:  Diagnosis Date  . Polysubstance abuse (HCC)    quit in 2019 - used ETOH, crack cocaine   Past Surgical History:  Procedure Laterality Date  . genital surgery     Family History  Problem Relation Age of Onset  . Stroke Neg Hx    Social History:  reports that he has been smoking cigarettes. He has a 22.50 pack-year smoking history. He has never used smokeless tobacco. He reports previous alcohol use. He reports previous drug use. Drug: "Crack" cocaine. Allergies: No Known Allergies Medications Prior to Admission  Medication Sig Dispense Refill  . naproxen sodium (ALEVE) 220 MG tablet Take 440 mg by mouth daily.    . cyclobenzaprine (FLEXERIL) 10 MG tablet Take 1 tablet (10 mg total) by mouth 2 (two) times daily as needed for up to 20 doses for muscle spasms. (Patient not taking: No sig reported) 20 tablet 0    Home: Home Living Family/patient expects to be discharged to:: Private residence Living Arrangements: Alone Available Help at Discharge: Family,Friend(s),Available PRN/intermittently Type of Home: House Home Access: Stairs to enter [employer] of Steps: 4 Entrance Stairs-Rails: Can reach both Home Layout: One level Firefighter: Standard Home Equipment: None Additional Comments: has been working as a Museum/gallery exhibitions officer and walks with no AD normally  Lives With: Alone  Functional History: Prior Function Level of Independence: Independent 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 Transfers Overall transfer level: Needs  assistance Equipment used: Straight cane Transfers: Sit to/from Stand Sit to Stand: Min guard General transfer comment: Min guard for safety as pt is impulsive, quickly coming to stand. Ambulation/Gait Ambulation/Gait assistance: Min guard,Min assist Gait Distance (Feet): 300 Feet Assistive device: Straight cane Gait Pattern/deviations: Step-to pattern,Step-through pattern,Decreased stride length,Decreased weight shift to left,Decreased step length - left,Decreased dorsiflexion - right,Decreased dorsiflexion - left (mild LLE Circumduction) General Gait Details: Pt demonstrated retention of learning from previous session of proper 2-point gait pattern with SPC in R hand. Pt displays decreased L step length and bil feet clearance with resulting L leg circumduction with swing, providing verbal and visual cues to improve, with mod success but repeated cues for carryover. Min guard majority of time, but as pt fatigued following stairs pt needed minA intermittently due to staggering. Gait velocity: reduced Gait velocity interpretation: <1.8 ft/sec, indicate of risk for recurrent falls Stairs: Yes Stairs assistance: Min assist,Mod assist Stair Management: One rail Right,Two rails,Step to pattern,Sideways Number of Stairs: 6 General stair comments: Ascending and descending with setp-to pattern, educating pt of leading up with R and down with L leg, good carryover. However, pt has difficulty remembering to place L hand on rail and tends to only use R, despite cues. Also, pt with poor L foot clearance ascending, resulting in him needing min-modA for stability and to physically clear foot despite cues. Descends sideways with extra time and effort and minA for stability.    ADL: ADL Overall ADL's : Needs assistance/impaired Eating/Feeding: Independent Grooming: Min guard,Standing Grooming Details (indicate cue type and reason): completed at sink level, demonstrate decreased fine motor and gross motor  coordination in LUE Upper Body Bathing: Set up,Sitting Lower Body Bathing: Min guard,Sit to/from stand Upper Body Dressing : Minimal assistance,Sitting Upper Body Dressing Details (indicate cue type and reason): pt would require assistance with fasteners Lower Body Dressing: Min guard,Sit to/from stand Toilet Transfer: Min Information systems manager Details (indicate cue type and reason): simulated in room Toileting- Clothing Manipulation and Hygiene: Min guard Functional mobility during ADLs: Min guard General ADL Comments: pt demonstrates decrased safety awareness, instability and LUE/LLE weakness  Cognition: Cognition Overall Cognitive Status: Impaired/Different from baseline Orientation Level: Oriented X4 Attention: Focused,Sustained Focused Attention: Appears intact Memory: Impaired Memory Impairment: Retrieval deficit,Decreased recall of new information,Decreased short term memory Decreased Short Term Memory:  (Immediate: 5/5; delayed: 4/5; with cue:0/1) Awareness: Impaired Awareness Impairment: Emergent impairment Problem Solving: Impaired Executive Function: Reasoning,Organizing,Sequencing Sequencing: Impaired Sequencing Impairment: Verbal complex (clock drawing 4/4; oterh) Organizing: Impaired Organizing Impairment: Verbal complex (Backward digit span: 1/2) Cognition Arousal/Alertness: Awake/alert Behavior During Therapy: Impulsive Overall Cognitive Status: Impaired/Different from baseline Area of Impairment: Awareness,Safety/judgement,Attention,Memory,Following commands,Problem solving Orientation Level: Situation Current Attention Level: Sustained Memory: Decreased short-term memory Following Commands: Follows one step commands inconsistently,Follows one step commands with increased time Safety/Judgement: Decreased awareness of deficits,Decreased awareness of safety Awareness: Emergent Problem Solving: Slow processing,Requires verbal cues,Requires tactile  cues General Comments: Pt impulsively quick with coming to stand or attempt stairs prior to being cued, needing reminders to wait until PT done explaining tasks. Pt with poor awareness of deficits and a safety risk as he continues to drag L foot, especially ascending stairs, and not use L hand for stability on stairs despite repeated multi-modal cues.  Blood pressure 129/85, pulse 71, temperature 97.8 F (36.6 C), temperature source Oral, resp. rate 18, height 6\' 3"  (1.905 m), weight 88.4 kg,  SpO2 100 %. Physical Exam Vitals reviewed.  Constitutional:      General: He is not in acute distress.    Appearance: He is normal weight.  HENT:     Head: Normocephalic and atraumatic.     Comments: Poor dentition    Right Ear: External ear normal.     Left Ear: External ear normal.     Nose: Nose normal.  Eyes:     General:        Right eye: No discharge.        Left eye: No discharge.     Extraocular Movements: Extraocular movements intact.  Cardiovascular:     Rate and Rhythm: Normal rate and regular rhythm.  Pulmonary:     Effort: Pulmonary effort is normal. No respiratory distress.     Breath sounds: No stridor.  Abdominal:     General: Bowel sounds are normal. There is distension.  Musculoskeletal:     Comments: No edema or tenderness in extremities  Skin:    General: Skin is warm and dry.  Neurological:     Mental Status: He is alert.     Comments: Patient is awake alert no acute distress.   Makes eye contact with examiner.   Follows commands.   Provides name and age. Motor: RUE/RLE: 5/5 proximal to distal LUE: 4/5 proximal to distal LLE: 4+/5 proximal to distal Left facial weakness Dysarthria  Psychiatric:        Mood and Affect: Mood normal.        Behavior: Behavior normal.     Results for orders placed or performed during the hospital encounter of 04/27/20 (from the past 24 hour(s))  Glucose, capillary     Status: None   Collection Time: 04/30/20  6:11 AM  Result  Value Ref Range   Glucose-Capillary 95 70 - 99 mg/dL   Comment 1 Notify RN    Comment 2 Document in Chart    No results found.  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   Stroke: Continue secondary stroke prophylaxis and Risk Factor Modification listed below:   Antiplatelet therapy:   Blood Pressure Management:  Continue current medication with prn's with permisive HTN per primary team Statin Agent:   Prediabetes management:   Tobacco abuse:  Counsel Left PT/OT for mobility, ADL training  Motor recovery: Zoloft Labs independently reviewed.  Records reviewed and summated above.  1. Does the need for close, 24 hr/day medical supervision in concert with the patient's rehab needs make it unreasonable for this patient to be served in a less intensive setting? Potentially 2. Co-Morbidities requiring supervision/potential complications: Dyslipidemia (statin), leukocytosis (repeat labs, cont to monitor for signs and symptoms of infection, further workup if indicated), tobacco abuse (counsel), prediabetes (Monitor in accordance with exercise and adjust meds as necessary) 3. Due to safety, disease management and patient education, does the patient require 24 hr/day rehab nursing? Potentially 4. Does the patient require coordinated care of a physician, rehab nurse, therapy disciplines of PT/OT/SLP to address physical and functional deficits in the context of the above medical diagnosis(es)? Potentially Addressing deficits in the following areas: balance, endurance, locomotion, strength, transferring, bathing, dressing, cognition, speech and psychosocial support 5. Can the patient actively participate in an intensive therapy program of at least 3 hrs of therapy per day at least 5 days per week? Yes 6. The potential for patient to make measurable gains while on inpatient rehab is  good 7. Anticipated functional outcomes upon discharge from inpatient rehab are modified independent and supervision  with PT, modified independent and supervision with OT, modified independent with SLP. 8. Estimated rehab length of stay to reach the above functional goals is: 5-8 days. 9. Anticipated discharge destination: Home 10. Overall Rehab/Functional Prognosis: good  RECOMMENDATIONS: This patient's condition is appropriate for continued rehabilitative care in the following setting: CIR if patient does not progress to supervision level of functioning Patient has agreed to participate in recommended program. Yes Note that insurance prior authorization may be required for reimbursement for recommended care.  Comment: Rehab Admissions Coordinator to follow up.  I have personally performed a face to face diagnostic evaluation, including, but not limited to relevant history and physical exam findings, of this patient and developed relevant assessment and plan.  Additionally, I have reviewed and concur with the physician assistant's documentation above.   Timothy Morrow, MD, ABPMR Timothy Rossetti Angiulli, PA-C 04/30/2020

## 2020-04-30 ENCOUNTER — Inpatient Hospital Stay (HOSPITAL_COMMUNITY): Payer: BC Managed Care – PPO

## 2020-04-30 DIAGNOSIS — E785 Hyperlipidemia, unspecified: Secondary | ICD-10-CM

## 2020-04-30 DIAGNOSIS — R7303 Prediabetes: Secondary | ICD-10-CM

## 2020-04-30 DIAGNOSIS — D72829 Elevated white blood cell count, unspecified: Secondary | ICD-10-CM

## 2020-04-30 DIAGNOSIS — I6340 Cerebral infarction due to embolism of unspecified cerebral artery: Secondary | ICD-10-CM | POA: Insufficient documentation

## 2020-04-30 DIAGNOSIS — Z720 Tobacco use: Secondary | ICD-10-CM

## 2020-04-30 DIAGNOSIS — I639 Cerebral infarction, unspecified: Secondary | ICD-10-CM

## 2020-04-30 DIAGNOSIS — F17210 Nicotine dependence, cigarettes, uncomplicated: Secondary | ICD-10-CM

## 2020-04-30 DIAGNOSIS — I63411 Cerebral infarction due to embolism of right middle cerebral artery: Secondary | ICD-10-CM

## 2020-04-30 LAB — GLUCOSE, CAPILLARY
Glucose-Capillary: 95 mg/dL (ref 70–99)
Glucose-Capillary: 103 mg/dL — ABNORMAL HIGH (ref 70–99)

## 2020-04-30 LAB — CARDIOLIPIN ANTIBODIES, IGG, IGM, IGA
Anticardiolipin IgA: <9 APL U/mL (ref 0–11)
Anticardiolipin IgG: 16 GPL U/mL — ABNORMAL HIGH (ref 0–14)
Anticardiolipin IgM: <9 MPL U/mL (ref 0–12)

## 2020-04-30 NOTE — Progress Notes (Signed)
Transcranial doppler with bubble study completed.   Please see CV Proc for preliminary results.   Cache Decoursey, RDMS  

## 2020-04-30 NOTE — Progress Notes (Signed)
STROKE TEAM PROGRESS NOTE   INTERVAL HISTORY No family at the bedside.  Patient lying in bed, no complaints, still has left facial droop, dysarthria and mild left hand clumsiness. Did bedside TCD bubble study which is negative for DVT. Pending loop on discharge. PT/OT recommend CIR.   Vitals:   04/29/20 2338 04/30/20 0300 04/30/20 0751 04/30/20 1125  BP: 130/81 (!) 130/91 129/85 120/80  Pulse: 68 66 71 74  Resp: 18 17 18 18   Temp: 98.1 F (36.7 C) 98.1 F (36.7 C) 97.8 F (36.6 C) 98.3 F (36.8 C)  TempSrc: Oral  Oral   SpO2: 100% 100% 100% 97%  Weight:      Height:       CBC:  Recent Labs  Lab 04/27/20 1020  WBC 10.7*  NEUTROABS 7.2  HGB 15.4  HCT 46.5  MCV 94.1  PLT 212   Basic Metabolic Panel:  Recent Labs  Lab 04/27/20 1020  NA 140  K 3.5  CL 107  CO2 22  GLUCOSE 118*  BUN 9  CREATININE 0.99  CALCIUM 9.0   Lipid Panel:  Recent Labs  Lab 04/28/20 0629  CHOL 156  TRIG 74  HDL 34*  CHOLHDL 4.6  VLDL 15  LDLCALC 06/26/20*   HgbA1c:  Recent Labs  Lab 04/28/20 0629  HGBA1C 5.9*   Urine Drug Screen:  Recent Labs  Lab 04/27/20 1635  LABOPIA NONE DETECTED  COCAINSCRNUR NONE DETECTED  LABBENZ NONE DETECTED  AMPHETMU NONE DETECTED  THCU NONE DETECTED  LABBARB NONE DETECTED    Alcohol Level No results for input(s): ETH in the last 168 hours.  IMAGING past 24 hours No results found.  PHYSICAL EXAM  Temp:  [97.6 F (36.4 C)-98.7 F (37.1 C)] 98.3 F (36.8 C) (02/10 1125) Pulse Rate:  [65-90] 74 (02/10 1125) Resp:  [16-18] 18 (02/10 1125) BP: (120-138)/(80-91) 120/80 (02/10 1125) SpO2:  [97 %-100 %] 97 % (02/10 1125)  General - Well nourished, well developed, in no apparent distress.  Ophthalmologic - fundi not visualized due to noncooperation.  Cardiovascular - Regular rhythm and rate.  Mental Status -  Level of arousal and orientation to time, place, and person were intact. Language including expression, naming, repetition,  comprehension was assessed and found intact. Fund of Knowledge was assessed and was intact  Cranial Nerves II - XII - II - Visual field intact OU. III, IV, VI - Extraocular movements intact. V - Facial sensation intact bilaterally. VII - left lower facial weakness. VIII - Hearing & vestibular intact bilaterally. X - Palate elevates symmetrically. XI - Chin turning & shoulder shrug intact bilaterally. XII - Tongue protrusion intact.  Motor Strength - The patient's strength was normal in all extremities except pronator drift was present on the left with 4+/5 left finger grip.  Bulk was normal and fasciculations were absent.   Motor Tone - Muscle tone was assessed at the neck and appendages and was normal.  Reflexes - The patient's reflexes were symmetrical in all extremities and he had no pathological reflexes.  Sensory - Light touch, temperature/pinprick were assessed and were decreased light touch sensation on the left  Coordination - The patient had normal movements in the hands with no ataxia or dysmetria although slow on the left.  Tremor was absent.  Gait and Station - deferred.   ASSESSMENT/PLAN  60 year old male presents with left-sided weakness, left facial droop, and dysarthria for approximately 3 days. Only known medical history is cigarette smoking and reported borderline hypertension.  CT head on arrival with evidence of an acute/subacute infarct in a portion of the anterior limb of the right internal capsule and adjacent white matter.  Neurology exam findings are congruent with the location of the stroke seen on CT, but also suggestive of right parietal lobe involvement.    Stroke - patchy multifocal acute infarcts involving the right CR, SO and BG, embolic pattern, source unclear  CT head Recent appearing and suspected acute infarct involving a portion ofthe anterior limb of the right internal capsule and immediately adjacent right centrum semiovale.    CTA head & neck:  Normal CTA of the head and neck.  MRI  Patchy multifocal acute ischemic nonhemorrhagic infarcts involving the periventricular and deep white matter of the rightcorona radiata and centrum semi ovale, with involvement of the underlying right basal ganglia.   2D Echo EF 60 to 65%. No shunt.   LE venous doppler - no DVT  TEE will be done as outpt once pt tooth condition improves to avoid aspiration  TCD bubble study no DVT  Recommend loop recorder placement to rule out afib   LDL 107  HgbA1c 5.9  UDS neg  Hypercoagulable lab unremarkable  VTE prophylaxis -Lovenox  No antithrombotic prior to admission, now on aspirin 81 mg daily and clopidogrel 75 mg daily DAPT for 3 weeks and then aspirin alone.  Therapy recommendations: CIR  Disposition: Pending  Hyperlipidemia  Home meds:  None  LDL 107, goal < 70  Add Lipitor 40  Continue statin at discharge  Tobacco abuse  Current smoker  Smoking cessation counseling provided  Nicotine patch provided  Pt is willing to quit  Other Stroke Risk Factors  Other Active Problems  Leukocytosis WBC 10.7  Hospital day # 3  Neurology will sign off. Please call with questions. Pt will follow up with stroke clinic NP at [facility] in about 4 weeks. Thanks for the consult.   Marvel Plan, MD PhD Stroke Neurology 04/30/2020 2:23 PM    To contact Stroke Continuity provider, please refer to [email or web address]. After hours, contact General Neurology

## 2020-04-30 NOTE — Progress Notes (Signed)
  Speech Language Pathology Treatment: Cognitive-Linquistic & Dysarthria Patient Details Name: Timothy Baker MRN: [id number] DOB: [birth year] Today's Date: 04/30/2020 Time: 6213-0865 SLP Time Calculation (min) (ACUTE ONLY): 24 min  Assessment / Plan / Recommendation Clinical Impression  Pt was seen for cognitive-linguistic and dysarthria treatment, He was alert and cooperative during the session. Pt was able to recall one of the two compensatory strategies provided yesterday and recalled reduced speaking rate with cueing. He demonstrated 68% with use of compensatory strategies at the phrase level increasing to 100% with cues for rate and overarticulation. At the 5-7-word sentence level, he achieved 50% accuracy with self-correction increasing to 90% with verbal prompts. Some generalization was noted to conversation following completion of these structured tasks. He completed a medication management (prescription) task with 80% accuracy increasing to 100% with cues. He recalled concrete information from recorded voicemails with 80% accuracy increasing to 100% with cues. He completed a mental manipulation sequencing task with 80% accuracy increasing to 100% with repetition. Pt required verbal prompts for details and steps during verbal sequencing of ADL tasks. Pt demonstrated tangential tendencies and frequently required cues to maintain the conversational topic. SLP will continue to follow pt.    HPI HPI: Pt is a 60 y.o. male with PMHx substance abuse, HTN, HLD, tobacco use. Pt presented with stroke-like symptoms including L-sided weakness, dyspahgia, and dysarthria. CT head 2/7: acute infarct involving a portion of the anterior limb of the right internal capsule and immediately adjacent right centrum semiovale. MRI brain 2/7: Patchy multifocal acute ischemic nonhemorrhagic infarcts involving the periventricular and deep white matter of the right corona radiata and centrum semi ovale, with involvement  of the underlying right basal ganglia.      SLP Plan  Continue with current plan of care       Recommendations                   Oral Care Recommendations: Oral care BID Follow up Recommendations: Inpatient Rehab SLP Visit Diagnosis: Dysarthria and anarthria (R47.1);Cognitive communication deficit (R41.841) Plan: Continue with current plan of care       Timothy Baker I. Timothy Clock, MS, CCC-SLP Acute Rehabilitation Services Office number [phone] Pager [phone]                Timothy Baker 04/30/2020, 5:33 PM

## 2020-04-30 NOTE — Progress Notes (Signed)
Inpatient Rehabilitation Admissions Coordinator  I met with patient at bedside for rehab assessment,t We discussed options for rehab. CIR bed is not available this week for pursuing admission. Patient lives alone, but he states his siblings have been discussing providing 24/7 assist at home and he prefers home with [facility] at this time. I discussed with Janie Morning RN CM. I have not begun insurance approval for CIR for bed not expected to be available this week.  Danne Baxter, RN, MSN Rehab Admissions Coordinator [phone] 04/30/2020 3:56 PM

## 2020-04-30 NOTE — Progress Notes (Signed)
[facility] Progress Note  Patient: Timothy Baker    HQP:[id number]  DOA: 04/27/2020     Date of Service: the patient was seen and examined on 04/30/2020  Brief hospital course: No significant past medical history presents with complaints of difficulty speaking and left-sided weakness.  Found to have acute CVA.  Neurology following. Currently plan is for loop recorder and arrange for safe discharge.  Assessment and Plan: 1.  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 Out of the window.  For TPA. CT A negative for any large vessel occlusion. Echocardiogram showed preserved EF. Lower extremity Doppler negative for DVT. Neurology recommended embolic work-up including a TEE but due to poor dentition currently TEE is postponed in outpatient area until patient has follow-up with dentistry. Loop recorder recommended.  We will follow up on cardiology recommendation. LDL 107. Hemoglobin A1c 5.9. Now on 81 mg aspirin as well as Plavix. Continue DAPT for 3 weeks and then aspirin alone. Also on Lipitor. Currently PT recommended CIR although the patient appears to be progressing well and therefore likely will not need CIR. Timothy Baker also does not have GERD. Home with home health most likely tomorrow.  Per rehab, family arranging 24/7 assistance for now.  2.  Hypertension Currently allowing permissive hypertension. Monitor.  3.  HLD On statin.  Monitor.  Lipitor 40.  4.  Active smoker Continue nicotine patch. Patient willing to quit right now. Counseling provided.  Diet: Cardiac diet DVT Prophylaxis:   enoxaparin (LOVENOX) injection 40 mg Start: 04/27/20 1600  Advance goals of care discussion: Full code  Family Communication: no family was present at bedside, at the time of interview.   Disposition:  Status is: Inpatient  Remains inpatient appropriate because:Ongoing diagnostic testing needed not appropriate for outpatient work up  Dispo: The  patient is from: Home              Anticipated d/c is to: Home              Anticipated d/c date is: 2 days              Patient currently is not medically stable to d/c.   Difficult to place patient   Subjective: No nausea no vomiting.  No fever no chills.  Patient requested fill in short-term disability papers which was done.  Physical Exam:  General: Appear in mild distress, no Rash; Oral Mucosa Clear, moist. no Abnormal Neck Mass Or lumps, Conjunctiva normal  Cardiovascular: S1 and S2 Present, no Murmur, Respiratory: good respiratory effort, Bilateral Air entry present and CTA, no Crackles, no wheezes Abdomen: Bowel Sound present, Soft and no tenderness Extremities: no Pedal edema Neurology: alert and oriented to time, place, and person affect appropriate. no new focal deficit continue to have left-sided weakness Gait not checked due to patient safety concerns  Vitals:   04/30/20 0300 04/30/20 0751 04/30/20 1125 04/30/20 1546  BP: (!) 130/91 129/85 120/80 137/79  Pulse: 66 71 74 73  Resp: 17 18 18 18   Temp: 98.1 F (36.7 C) 97.8 F (36.6 C) 98.3 F (36.8 C) 98.4 F (36.9 C)  TempSrc:  Oral  Oral  SpO2: 100% 100% 97% 100%  Weight:      Height:        Intake/Output Summary (Last 24 hours) at 04/30/2020 1840 Last data filed at 04/29/2020 2000 Gross per 24 hour  Intake 200 ml  Output -  Net 200 ml   Filed Weights   04/28/20 1538  04/29/20 1205  Weight: 88.4 kg 88.4 kg    Data Reviewed: I have personally reviewed and interpreted daily labs, tele strips, imaging. I reviewed all nursing notes, pharmacy notes, vitals, pertinent old records I have discussed plan of care as described above with RN and patient/family.  CBC: Recent Labs  Lab 04/27/20 1020  WBC 10.7*  NEUTROABS 7.2  HGB 15.4  HCT 46.5  MCV 94.1  PLT 212   Basic Metabolic Panel: Recent Labs  Lab 04/27/20 1020  NA 140  K 3.5  CL 107  CO2 22  GLUCOSE 118*  BUN 9  CREATININE 0.99  CALCIUM 9.0     Studies: VAS [place] TRANSCRANIAL DOPPLER W BUBBLES  Result Date: 04/30/2020  Transcranial Doppler with Bubble Indications: Stroke. Comparison Study: No prior studies. Performing Technologist: Jean Rosenthal RDMS  Examination Guidelines: A complete evaluation includes B-mode imaging, spectral Doppler, color Doppler, and power Doppler as needed of all accessible portions of each vessel. Bilateral testing is considered an integral part of a complete examination. Limited examinations for reoccurring indications may be performed as noted.  Summary: No HITS at rest or during Valsalva. Negative transcranial Doppler Bubble study with no evidence of right to left intracardiac communication.  A vascular evaluation was performed. The left middle cerebral artery was studied. An IV was inserted into the patient's left forearm. Verbal informed consent was obtained.  NEGATIVE TCD Bubble study *See table(s) above for TCD measurements and observations.  Diagnosing physician: Delia Heady MD Electronically signed by Delia Heady MD on 04/30/2020 at 6:15:51 PM.    Final     Scheduled Meds: .  stroke: mapping our early stages of recovery book   Does not apply Once  . aspirin EC  81 mg Oral Daily  . atorvastatin  40 mg Oral Daily  . clopidogrel  75 mg Oral Daily  . enoxaparin (LOVENOX) injection  40 mg Subcutaneous Q24H  . nicotine  14 mg Transdermal Daily   Continuous Infusions: PRN Meds: acetaminophen **OR** acetaminophen (TYLENOL) oral liquid 160 mg/5 mL **OR** acetaminophen, ondansetron (ZOFRAN) IV, senna-docusate  Time spent: 35 minutes  Author: Lynden Oxford, MD [facility] 04/30/2020 6:40 PM  To reach On-call, see care teams to locate the attending and reach out via [email or web address]. Between 7PM-7AM, please contact night-coverage If you still have difficulty reaching the attending provider, please page the [facility] (Director on Call) for [facility] on amion for assistance.

## 2020-04-30 NOTE — Progress Notes (Addendum)
Physical Therapy Treatment Patient Details Name: Timothy Baker MRN: [id number] DOB: 1960-04-04 Today's Date: 04/30/2020    History of Present Illness 60 yo male with onset of L side weakness sustained a fall climbing up to change smoke detector battery on 2/5, then on 2/6 came to ED with significant changes in his L side facially and in L extremities.  On imaging his notes were that the infarction was in R anterior limb of internal capsule.  Pt has memory changes, coordination changes and impulsive behavior.  PMHx:  substance abuse, HTN, HLD, tobacco use,    PT Comments    Pt continues to demonstrate progress as he was able to attempt stair negotiation this date. However, his lack of hip flexor and anterior tibialis strength and coordination in his L leg impacts his safety, particularly when ascending as he tends to not adequately clear his L foot and needs min-modA to maintain his balance and physically lift his L foot. In addition, he descends sideways, again likely due to poor strength and coordination. Pt also neglects to use his L hand on the rails for further stability despite cues. His cognitive deficits of slow processing and poor awareness of deficits and safety along with his physical deficits of coordination, strength, and balance impairments place him at risk for falls with mobility. This is further supported by his DGI score of 17 this date, indicating he is at risk for falls. Will continue to follow acutely. Current recommendations remain appropriate to maximize his safety and independence with all functional mobility prior to return home. Educated pt on HEP of seated hip flexion and ankle dorsiflexion with resistance to address his major deficits that place him at risk for falls.  Follow Up Recommendations  CIR (if does not qualify for CIR then outpatient PT)     Equipment Recommendations  [facility] - may change with progress)    Recommendations for Other Services Rehab  consult     Precautions / Restrictions Precautions Precautions: Fall Precaution Comments: stands with quick movements, not in control Restrictions Weight Bearing Restrictions: No    Mobility  Bed Mobility Overal bed mobility: Needs Assistance Bed Mobility: Supine to Sit     Supine to sit: Supervision     General bed mobility comments: Pt able to come to sit R EOB with supervision for safety.  Transfers Overall transfer level: Needs assistance Equipment used: Straight cane Transfers: Sit to/from Stand Sit to Stand: Min guard         General transfer comment: Min guard for safety as pt is impulsive, quickly coming to stand.  Ambulation/Gait Ambulation/Gait assistance: Min guard;Min assist Gait Distance (Feet): 300 Feet Assistive device: Straight cane Gait Pattern/deviations: Step-to pattern;Step-through pattern;Decreased stride length;Decreased weight shift to left;Decreased step length - left;Decreased dorsiflexion - right;Decreased dorsiflexion - left (mild LLE Circumduction) Gait velocity: reduced Gait velocity interpretation: <1.8 ft/sec, indicate of risk for recurrent falls General Gait Details: Pt demonstrated retention of learning from previous session of proper 2-point gait pattern with SPC in R hand. Pt displays decreased L step length and bil feet clearance with resulting L leg circumduction with swing, providing verbal and visual cues to improve, with mod success but repeated cues for carryover. Min guard majority of time, but as pt fatigued following stairs pt needed minA intermittently due to staggering.   Stairs Stairs: Yes Stairs assistance: Min assist;Mod assist Stair Management: One rail Right;Two rails;Step to pattern;Sideways Number of Stairs: 6 General stair comments: Ascending and descending with setp-to  pattern, educating pt of leading up with R and down with L leg, good carryover. However, pt has difficulty remembering to place L hand on rail and  tends to only use R, despite cues. Also, pt with poor L foot clearance ascending, resulting in him needing min-modA for stability and to physically clear foot despite cues. Descends sideways with extra time and effort and minA for stability.   Wheelchair Mobility    Modified Rankin (Stroke Patients Only) Modified Rankin (Stroke Patients Only) Pre-Morbid Rankin Score: No symptoms Modified Rankin: Moderate disability     Balance Overall balance assessment: Needs assistance Sitting-balance support: Feet supported Sitting balance-Leahy Scale: Good Sitting balance - Comments: Sitting EOB no LOB, supervision for safety.   Standing balance support: No upper extremity supported Standing balance-Leahy Scale: Fair Standing balance comment: Able to stand statically without UE support and needs 1 UE support for safer mobility.                 Standardized Balance Assessment Standardized Balance Assessment : Dynamic Gait Index   Dynamic Gait Index Level Surface: Mild Impairment Change in Gait Speed: Mild Impairment Gait with Horizontal Head Turns: Normal Gait with Vertical Head Turns: Normal Gait and Pivot Turn: Mild Impairment Step Over Obstacle: Mild Impairment Step Around Obstacles: Mild Impairment Steps: Moderate Impairment Total Score: 17      Cognition Arousal/Alertness: Awake/alert Behavior During Therapy: Impulsive Overall Cognitive Status: Impaired/Different from baseline Area of Impairment: Awareness;Safety/judgement;Attention;Memory;Following commands;Problem solving                   Current Attention Level: Sustained Memory: Decreased short-term memory Following Commands: Follows one step commands inconsistently;Follows one step commands with increased time Safety/Judgement: Decreased awareness of deficits;Decreased awareness of safety Awareness: Emergent Problem Solving: Slow processing;Requires verbal cues;Requires tactile cues General Comments: Pt  impulsively quick with coming to stand or attempt stairs prior to being cued, needing reminders to wait until PT done explaining tasks. Pt with poor awareness of deficits and a safety risk as he continues to drag L foot, especially ascending stairs, and not use L hand for stability on stairs despite repeated multi-modal cues.      Exercises      General Comments General comments (skin integrity, edema, etc.): Educated pt on HEP of seated hip flexion and ankle dorsiflexion with physical resistance, pt completing several reps to encourage learning/retention      Pertinent Vitals/Pain Pain Assessment: No/denies pain    Home Living                      Prior Function            PT Goals (current goals can now be found in the care plan section) Acute Rehab PT Goals Patient Stated Goal: to improve PT Goal Formulation: With patient Potential to Achieve Goals: Good Progress towards PT goals: Progressing toward goals    Frequency    Min 4X/week      PT Plan Current plan remains appropriate;Equipment recommendations need to be updated    Co-evaluation              AM-PAC PT "6 Clicks" Mobility   Outcome Measure  Help needed turning from your back to your side while in a flat bed without using bedrails?: None Help needed moving from lying on your back to sitting on the side of a flat bed without using bedrails?: None Help needed moving to and from a bed to a chair (  including a wheelchair)?: A Little Help needed standing up from a chair using your arms (e.g., wheelchair or bedside chair)?: A Little Help needed to walk in hospital room?: A Little Help needed climbing 3-5 steps with a railing? : A Lot 6 Click Score: 19    End of Session Equipment Utilized During Treatment: Gait belt Activity Tolerance: Patient tolerated treatment well Patient left: in chair;with call bell/phone within reach;with chair alarm set   PT Visit Diagnosis: Unsteadiness on feet  (R26.81);Muscle weakness (generalized) (M62.81);Difficulty in walking, not elsewhere classified (R26.2);Hemiplegia and hemiparesis;Other abnormalities of gait and mobility (R26.89);Other symptoms and signs involving the nervous system (R29.898) Hemiplegia - Right/Left: Left Hemiplegia - dominant/non-dominant: Non-dominant Hemiplegia - caused by: Cerebral infarction     Time: 3016-0109 PT Time Calculation (min) (ACUTE ONLY): 27 min  Charges:  $Gait Training: 23-37 mins                     Raymond Gurney, PT, DPT Acute Rehabilitation Services  Pager: [phone] Office: [phone]    Jewel Baize 04/30/2020, 8:56 AM

## 2020-05-01 ENCOUNTER — Encounter (HOSPITAL_COMMUNITY): Payer: Self-pay | Admitting: Internal Medicine

## 2020-05-01 ENCOUNTER — Encounter (HOSPITAL_COMMUNITY)
Admission: EM | Disposition: A | Discharge status: Home / Self Care | Payer: Self-pay | Source: Home / Self Care | Attending: Internal Medicine

## 2020-05-01 HISTORY — PX: LOOP RECORDER INSERTION: EP1214

## 2020-05-01 LAB — GLUCOSE, CAPILLARY: Glucose-Capillary: 104 mg/dL — ABNORMAL HIGH (ref 70–99)

## 2020-05-01 SURGERY — LOOP RECORDER INSERTION

## 2020-05-01 MED ORDER — CLOPIDOGREL BISULFATE 75 MG PO TABS: 75.0000 mg | ORAL_TABLET | Freq: Every day | ORAL | 0 refills | Status: DC

## 2020-05-01 MED ORDER — NICOTINE 14 MG/24HR TD PT24: 14.0000 mg | MEDICATED_PATCH | Freq: Every day | TRANSDERMAL | 0 refills | Status: AC

## 2020-05-01 MED ORDER — ATORVASTATIN CALCIUM 40 MG PO TABS: 40.0000 mg | ORAL_TABLET | Freq: Every day | ORAL | 0 refills | Status: DC

## 2020-05-01 MED ORDER — LIDOCAINE-EPINEPHRINE 1 %-1:100000 IJ SOLN
INTRAMUSCULAR | Status: DC | PRN
Start: 2020-05-01 — End: 2020-05-01
  Administered 2020-05-01: 15 mL

## 2020-05-01 MED ORDER — LIDOCAINE-EPINEPHRINE 1 %-1:100000 IJ SOLN
INTRAMUSCULAR | Status: AC
  Filled 2020-05-01: qty 1

## 2020-05-01 MED ORDER — ASPIRIN 81 MG PO TBEC: 81.0000 mg | DELAYED_RELEASE_TABLET | Freq: Every day | ORAL | 0 refills | Status: AC

## 2020-05-01 MED ORDER — ASPIRIN 81 MG PO TBEC: 81.0000 mg | DELAYED_RELEASE_TABLET | Freq: Every day | ORAL | 0 refills | Status: DC

## 2020-05-01 SURGICAL SUPPLY — 2 items
MONITOR REVEAL LINQ II (Prosthesis & Implant Heart) ×1 IMPLANT
PACK LOOP INSERTION (CUSTOM PROCEDURE TRAY) ×2 IMPLANT

## 2020-05-01 NOTE — Progress Notes (Signed)
Occupational Therapy Treatment Patient Details Name: Timothy Baker MRN: [id number] DOB: [birth year] Today's Date: 05/01/2020    History of present illness 60 yo male with onset of L side weakness sustained a fall climbing up to change smoke detector battery on 2/5, then on 2/6 came to ED with significant changes in his L side facially and in L extremities.  On imaging his notes were that the infarction was in R anterior limb of internal capsule.  Pt has memory changes, coordination changes and impulsive behavior.  PMHx:  substance abuse, HTN, HLD, tobacco use,   OT comments  Pt received in bed, agreeable to OT session. Pt reports no pain at this time. Per chart, CIR signed off as pt plans to d/c home with family, updated d/c recommendation to [facility] with 24/7 supervision/assistance due to pt's impulsivity. Pt currently requires minA to minguard assistance at straight cane level. He continues to demonstrate decreased awareness of safety and of deficits. Pt scored 4/10 on medicog demonstrating limitations with medication management and executive functioning skills. Pt will continue to benefit from skilled OT services to maximize safety and independence with ADL/IADL and functional mobility. Will continue to follow acutely and progress as tolerated.    Follow Up Recommendations  Home health OT;Supervision/Assistance - 24 hour (due to impulsivity)    Equipment Recommendations  None recommended by OT    Recommendations for Other Services      Precautions / Restrictions Precautions Precautions: Fall Precaution Comments: pt impulsive Restrictions Weight Bearing Restrictions: No       Mobility Bed Mobility Overal bed mobility: Needs Assistance Bed Mobility: Supine to Sit     Supine to sit: Supervision     General bed mobility comments: Pt able to come to sit R EOB with supervision for safety.  Transfers Overall transfer level: Needs assistance Equipment used: Straight  cane Transfers: Sit to/from Stand Sit to Stand: Min assist         General transfer comment: minA initially for safety and stability, pt progressed to minguard pt with 1x minor loss of balance due to impulsivity with mobility, pt able to self correct    Balance Overall balance assessment: Needs assistance Sitting-balance support: Feet supported Sitting balance-Leahy Scale: Good Sitting balance - Comments: Sitting EOB no LOB, supervision for safety.   Standing balance support: Single extremity supported;During functional activity Standing balance-Leahy Scale: Poor Standing balance comment: reliant on at least single UE support for stability in standing                           ADL either performed or assessed with clinical judgement   ADL Overall ADL's : Needs assistance/impaired Eating/Feeding: Modified independent Eating/Feeding Details (indicate cue type and reason): increased time and effort, able to maintain grasp with built up utensil                     Toilet Transfer: Minimal assistance;Ambulation (with cane) Toilet Transfer Details (indicate cue type and reason): short distance in room mobility, pt with minor loss of balance able to correct by stabilizing on bed rail Toileting- Clothing Manipulation and Hygiene: Minimal assistance;Sit to/from stand Toileting - Clothing Manipulation Details (indicate cue type and reason): to stabilize with initial stand     Functional mobility during ADLs: Min guard General ADL Comments: pt required minA for initial stability progressing to minguard     Vision   Vision Assessment?: No apparent visual deficits Additional Comments:  pt able to read medicog instructions   Perception     Praxis      Cognition Arousal/Alertness: Awake/alert Behavior During Therapy: Impulsive Overall Cognitive Status: Impaired/Different from baseline Area of Impairment: Awareness;Safety/judgement;Attention;Memory;Following  commands;Problem solving                 Orientation Level: Situation Current Attention Level: Sustained Memory: Decreased short-term memory Following Commands: Follows one step commands inconsistently;Follows one step commands with increased time Safety/Judgement: Decreased awareness of deficits;Decreased awareness of safety Awareness: Emergent Problem Solving: Slow processing;Requires verbal cues;Requires tactile cues General Comments: pt impulsive and demonstrates decreased awareness of safety and of deficits. Pt scored 4/10 indicating limitations with problem solving, organization, working memory, short term memory and attention. Pt demonstrates decreased understanding of need for assistance. Educated pt on medication management strategies and need for assistance at d/c        Exercises     Shoulder Instructions       General Comments      Pertinent Vitals/ Pain       Pain Assessment: No/denies pain  Home Living                                          Prior Functioning/Environment              Frequency  Min 2X/week        Progress Toward Goals  OT Goals(current goals can now be found in the care plan section)  Progress towards OT goals: Progressing toward goals  Acute Rehab OT Goals Patient Stated Goal: to improve OT Goal Formulation: With patient Time For Goal Achievement: 05/13/20 Potential to Achieve Goals: Good ADL Goals Pt Will Perform Grooming: Independently;standing Pt Will Transfer to Toilet: Independently;ambulating Additional ADL Goal #1: Pt will complete multistep ADL task with <3 errors for safe engagment of ADL/IADL and fucntional mobility.  Plan Discharge plan needs to be updated    Co-evaluation                 AM-PAC OT "6 Clicks" Daily Activity     Outcome Measure   Help from another person eating meals?: None Help from another person taking care of personal grooming?: A Little Help from another  person toileting, which includes using toliet, bedpan, or urinal?: A Little Help from another person bathing (including washing, rinsing, drying)?: A Little Help from another person to put on and taking off regular upper body clothing?: A Little Help from another person to put on and taking off regular lower body clothing?: A Little 6 Click Score: 19    End of Session Equipment Utilized During Treatment:  (cane)  OT Visit Diagnosis: Other abnormalities of gait and mobility (R26.89);Muscle weakness (generalized) (M62.81);Other symptoms and signs involving cognitive function   Activity Tolerance Patient tolerated treatment well   Patient Left with call bell/phone within reach;in chair;with chair alarm set   Nurse Communication Mobility status        Time: 4481-8563 OT Time Calculation (min): 23 min  Charges: OT General Charges $OT Visit: 1 Visit OT Treatments $Self Care/Home Management : 23-37 mins  Rosey Bath OTR/L Acute Rehabilitation Services Office: [phone]    Rebeca Alert 05/01/2020, 12:53 PM

## 2020-05-01 NOTE — Discharge Summary (Signed)
[facility] Discharge Summary   Patient: Timothy Baker [id number]:[id number]  PCP: Renford Dills, MD  Date of admission: 04/27/2020   Date of discharge: 05/01/2020     Discharge Diagnoses:  Principal diagnosis Acute CVA.  Active Problems:   Tobacco dependence due to cigarettes   Leukocytosis   Tobacco abuse   Prediabetes   Dyslipidemia   Cerebral embolism with cerebral infarction   Admitted From: Home  Disposition:  Home with family for 24/7 supervision and outpatient neuro rehab.  Initially recommended CIR but CIR was unable to accept the patient is able to to go home.  Recommendations for Outpatient Follow-up:  1. PCP: Follow-up with PCP in 1 week.  Neurologist recommended. 2. Follow up LABS/TEST: TEE once dental work-up is done.   Follow-up Information    [facility]. Schedule an appointment as soon as possible for a visit in 4 week(s).   Specialty: Neurology Contact information: [address] [place] [address] [phone]       Renford Dills, MD. Schedule an appointment as soon as possible for a visit in 1 week(s).   Specialty: Internal Medicine Contact information: [address] [place] [address] [phone]        [facility] Follow up.   Specialty: Rehabilitation Why: The outpatient therapy will contact you for the first appointment Contact information: [address] [id number] mc [place] [address] [phone]       [facility] Follow up.   Why: 05/12/2020 at 320 pm for post loop recorder wound check Contact information: [address] [place] [address] [phone]             Discharge Instructions    Ambulatory referral to Neurology   Complete by: As directed    Follow up with stroke clinic NP (Jessica Vanschaick or Darrol Angel, if both not available, consider Manson Allan, or Ahern) at [facility] in about 4 weeks. Thanks.   Ambulatory referral to Occupational Therapy   Complete by: As directed    Ambulatory referral to Physical Therapy   Complete by: As directed    Ambulatory referral to Speech Therapy   Complete by: As directed    Diet - low sodium heart healthy   Complete by: As directed    Increase activity slowly   Complete by: As directed       Diet recommendation: Cardiac diet  Activity: The patient is advised to gradually reintroduce usual activities, as tolerated  Discharge Condition: stable  Code Status: Full code   History of present illness: As per the H and P dictated on admission, " Timothy Baker is a 60 y.o. male with no significant medical history presenting with stroke-like symptoms.  He reports that he felt off balance at work Friday and this continued all weekend.  +dysphagia, +dysarthria.  L-sided weakness.  His sister reports that his job called her this morning when he did not show up (very unusual).  He was dragging his foot, spaced out, difficult speaking, severe slurring, mouth twisted.  He does not see a doctor regularly.  He is a recovering addict x 3 years.    ED Course:  CVA.  No PMH.  3 days of dysarthria, L-sided weakness.  Did not show up for work so sister went to check on him.  CT head with acute infarct of R internal capsule."  Hospital Course:  Summary of his  active problems in the hospital is as following. 1.  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 Out of the window.  For TPA. CT A negative for any large vessel occlusion. Echocardiogram showed preserved EF. Lower extremity Doppler negative for DVT. Neurology recommended embolic work-up including a TEE but due to poor dentition currently TEE is postponed in outpatient area until patient has follow-up with dentistry. Loop recorder recommended.  We will follow up on cardiology  recommendation. LDL 107. Hemoglobin A1c 5.9. Now on 81 mg aspirin as well as Plavix. Continue DAPT for 3 weeks and then aspirin alone. Also on Lipitor. Currently PT recommended CIR although the patient appears to be progressing well and therefore likely will not need CIR.  Home with outpatient neuro rehab.  Family will arrange 24/7 supervision.  2.  Hypertension Currently allowing permissive hypertension. Monitor.  3.  HLD On statin.  Monitor.  Lipitor 40.  4.  Active smoker Continue nicotine patch. Patient willing to quit right now. Counseling provided.  Patient was seen by physical therapy, who recommended CIR, CIR was not able to accept the patient.  Patient was discharged home with outpatient neuro rehab arrangements.  Consultants: Neurology  Procedures: Echocardiogram, loop recorder placement, TEE was attempted but canceled.  DISCHARGE MEDICATION: Allergies as of 05/01/2020   No Known Allergies     Medication List    STOP taking these medications   naproxen sodium 220 MG tablet Commonly known as: ALEVE     TAKE these medications   aspirin 81 MG EC tablet Take 1 tablet (81 mg total) by mouth daily. Swallow whole.   atorvastatin 40 MG tablet Commonly known as: LIPITOR Take 1 tablet (40 mg total) by mouth daily.   clopidogrel 75 MG tablet Commonly known as: PLAVIX Take 1 tablet (75 mg total) by mouth daily.   cyclobenzaprine 10 MG tablet Commonly known as: FLEXERIL Take 1 tablet (10 mg total) by mouth 2 (two) times daily as needed for up to 20 doses for muscle spasms.   nicotine 14 mg/24hr patch Commonly known as: NICODERM CQ - dosed in mg/24 hours Place 1 patch (14 mg total) onto the skin daily.            Durable Medical Equipment  (From admission, onward)         Start     Ordered   05/01/20 0921  For home use only DME Cane  Once        05/01/20 0920   05/01/20 0921  For home use only DME 3 n 1  Once        05/01/20 0920           Discharge Exam: Filed Weights   04/28/20 1538 04/29/20 1205  Weight: 88.4 kg 88.4 kg   Vitals:   05/01/20 1138 05/01/20 1510  BP: 114/72 113/74  Pulse: 74 71  Resp: 18 18  Temp: 98.1 F (36.7 C) 98 F (36.7 C)  SpO2: 98% 99%   General: Appear in no distress, no Rash; Oral Mucosa Clear, moist. no Abnormal Neck Mass Or lumps, Conjunctiva normal  Cardiovascular: S1 and S2 Present, no Murmur Respiratory: good respiratory effort, Bilateral Air entry present and CTA, no Crackles, no wheezes Abdomen: Bowel Sound present, Soft and no tenderness Extremities: no Pedal edema Neurology: alert and oriented to time, place, and person affect appropriate.  Left-sided weakness, otherwise new focal deficit  The results of significant diagnostics from this hospitalization (including imaging, microbiology, ancillary and laboratory)  are listed below for reference.    Significant Diagnostic Studies: CT ANGIO HEAD W OR WO CONTRAST  Result Date: 04/27/2020 CLINICAL DATA:  Right hemisphere small vessel infarcts. EXAM: CT ANGIOGRAPHY HEAD AND NECK TECHNIQUE: Multidetector CT imaging of the head and neck was performed using the standard protocol during bolus administration of intravenous contrast. Multiplanar CT image reconstructions and MIPs were obtained to evaluate the vascular anatomy. Carotid stenosis measurements (when applicable) are obtained utilizing NASCET criteria, using the distal internal carotid diameter as the denominator. CONTRAST:  75mL OMNIPAQUE IOHEXOL 350 MG/ML SOLN COMPARISON:  None. FINDINGS: CTA NECK FINDINGS SKELETON: There is no bony spinal canal stenosis. No lytic or blastic lesion. OTHER NECK: Normal pharynx, larynx and major salivary glands. No cervical lymphadenopathy. Unremarkable thyroid gland. UPPER CHEST: No pneumothorax or pleural effusion. No nodules or masses. AORTIC ARCH: There is no calcific atherosclerosis of the aortic arch. There is no aneurysm, dissection or  hemodynamically significant stenosis of the visualized portion of the aorta. Conventional 3 vessel aortic branching pattern. The visualized proximal subclavian arteries are widely patent. RIGHT CAROTID SYSTEM: Normal without aneurysm, dissection or stenosis. LEFT CAROTID SYSTEM: Normal without aneurysm, dissection or stenosis. VERTEBRAL ARTERIES: Left dominant configuration. Both origins are clearly patent. There is no dissection, occlusion or flow-limiting stenosis to the skull base (V1-V3 segments). CTA HEAD FINDINGS POSTERIOR CIRCULATION: --Vertebral arteries: Normal V4 segments. --Inferior cerebellar arteries: Normal. --Basilar artery: Normal. --Superior cerebellar arteries: Normal. --Posterior cerebral arteries (PCA): Normal. ANTERIOR CIRCULATION: --Intracranial internal carotid arteries: Normal. --Anterior cerebral arteries (ACA): Normal. Both A1 segments are present. Patent anterior communicating artery (a-comm). --Middle cerebral arteries (MCA): Normal. VENOUS SINUSES: As permitted by contrast timing, patent. ANATOMIC VARIANTS: Fetal origins of both posterior cerebral arteries. Review of the MIP images confirms the above findings. IMPRESSION: Normal CTA of the head and neck. Electronically Signed   By: Deatra Robinson M.D.   On: 04/27/2020 19:35   CT HEAD WO CONTRAST  Result Date: 04/27/2020 CLINICAL DATA:  Left-sided facial droop and slurred speech EXAM: CT HEAD WITHOUT CONTRAST TECHNIQUE: Contiguous axial images were obtained from the base of the skull through the vertex without intravenous contrast. COMPARISON:  None. FINDINGS: Brain: Ventricles and sulci are normal in size and configuration. There is no appreciable intracranial mass, hemorrhage, extra-axial fluid collection, or midline shift. There is decreased attenuation involving the anterior limb of the right internal capsule as well as the immediately adjacent anterior, inferior right centrum semiovale, suspicious for recent and likely acute  infarct in this region. Elsewhere brain parenchyma appears unremarkable. Vascular: No hyperdense vessel. There is calcification in each carotid siphon region. Skull: Bony calvarium appears intact. Sinuses/Orbits: Visualized paranasal sinuses are clear. Orbits appear symmetric bilaterally. Other: Mastoid air cells are clear. IMPRESSION: Recent appearing and suspected acute infarct involving a portion of the anterior limb of the right internal capsule and immediately adjacent right centrum semiovale. Elsewhere brain parenchyma appears unremarkable. No mass or hemorrhage demonstrable. There are foci of arterial vascular calcification. Electronically Signed   By: Bretta Bang III M.D.   On: 04/27/2020 11:30   CT ANGIO NECK W OR WO CONTRAST  Result Date: 04/27/2020 CLINICAL DATA:  Right hemisphere small vessel infarcts. EXAM: CT ANGIOGRAPHY HEAD AND NECK TECHNIQUE: Multidetector CT imaging of the head and neck was performed using the standard protocol during bolus administration of intravenous contrast. Multiplanar CT image reconstructions and MIPs were obtained to evaluate the vascular anatomy. Carotid stenosis measurements (when applicable) are obtained utilizing NASCET criteria, using the  distal internal carotid diameter as the denominator. CONTRAST:  52mL OMNIPAQUE IOHEXOL 350 MG/ML SOLN COMPARISON:  None. FINDINGS: CTA NECK FINDINGS SKELETON: There is no bony spinal canal stenosis. No lytic or blastic lesion. OTHER NECK: Normal pharynx, larynx and major salivary glands. No cervical lymphadenopathy. Unremarkable thyroid gland. UPPER CHEST: No pneumothorax or pleural effusion. No nodules or masses. AORTIC ARCH: There is no calcific atherosclerosis of the aortic arch. There is no aneurysm, dissection or hemodynamically significant stenosis of the visualized portion of the aorta. Conventional 3 vessel aortic branching pattern. The visualized proximal subclavian arteries are widely patent. RIGHT CAROTID SYSTEM:  Normal without aneurysm, dissection or stenosis. LEFT CAROTID SYSTEM: Normal without aneurysm, dissection or stenosis. VERTEBRAL ARTERIES: Left dominant configuration. Both origins are clearly patent. There is no dissection, occlusion or flow-limiting stenosis to the skull base (V1-V3 segments). CTA HEAD FINDINGS POSTERIOR CIRCULATION: --Vertebral arteries: Normal V4 segments. --Inferior cerebellar arteries: Normal. --Basilar artery: Normal. --Superior cerebellar arteries: Normal. --Posterior cerebral arteries (PCA): Normal. ANTERIOR CIRCULATION: --Intracranial internal carotid arteries: Normal. --Anterior cerebral arteries (ACA): Normal. Both A1 segments are present. Patent anterior communicating artery (a-comm). --Middle cerebral arteries (MCA): Normal. VENOUS SINUSES: As permitted by contrast timing, patent. ANATOMIC VARIANTS: Fetal origins of both posterior cerebral arteries. Review of the MIP images confirms the above findings. IMPRESSION: Normal CTA of the head and neck. Electronically Signed   By: Deatra Robinson M.D.   On: 04/27/2020 19:35   MR BRAIN WO CONTRAST  Result Date: 04/27/2020 CLINICAL DATA:  Initial evaluation for neuro deficit, acute stroke. EXAM: MRI HEAD WITHOUT CONTRAST TECHNIQUE: Multiplanar, multiecho pulse sequences of the brain and surrounding structures were obtained without intravenous contrast. COMPARISON:  Prior head CT from earlier the same day. FINDINGS: Brain: Examination degraded by motion artifact. Cerebral volume within normal limits for age. Mild chronic microvascular ischemic disease present within the periventricular and deep white matter both cerebral hemispheres. Patchy multifocal areas of restricted diffusion seen involving the periventricular and deep white matter of the right corona radiata and centrum semi ovale. Mild patchy involvement of the underlying right basal ganglia and subinsular white matter. No associated hemorrhage or mass effect. No other evidence for acute  or subacute ischemia. Gray-white matter differentiation otherwise maintained. No other encephalomalacia to suggest chronic cortical infarction. No other evidence for acute or chronic intracranial hemorrhage. No mass lesion, midline shift or mass effect. No hydrocephalus or extra-axial fluid collection. Pituitary gland suprasellar region within normal limits. Midline structures intact. Vascular: Major intracranial vascular flow voids are maintained. Skull and upper cervical spine: Craniocervical junction normal. Bone marrow signal intensity within normal limits. No scalp soft tissue abnormality. Sinuses/Orbits: Globes and orbital soft tissues demonstrate no acute finding. Mild scattered mucosal thickening noted within the ethmoidal air cells and maxillary sinuses. Paranasal sinuses are otherwise clear. No mastoid effusion. Inner ear structures grossly normal. Other: None. IMPRESSION: 1. Patchy multifocal acute ischemic nonhemorrhagic infarcts involving the periventricular and deep white matter of the right corona radiata and centrum semi ovale, with involvement of the underlying right basal ganglia. 2. Underlying mild chronic microvascular ischemic disease. Electronically Signed   By: Rise Mu M.D.   On: 04/27/2020 18:38   EP PPM/ICD IMPLANT  Result Date: 05/01/2020 SURGEON:  Hillis Range, MD   PREPROCEDURE DIAGNOSIS:  Cryptogenic Stroke   POSTPROCEDURE DIAGNOSIS:  Cryptogenic Stroke    PROCEDURES:  1. Implantable loop recorder implantation   INTRODUCTION:  Timothy Baker is a 60 y.o. male with a history of unexplained stroke who  presents today for implantable loop implantation.  The patient has had a cryptogenic stroke.  Despite an extensive workup by neurology, no reversible causes have been identified.  he has worn telemetry during which he did not have arrhythmias.  There is significant concern for possible atrial fibrillation as the cause for the patients stroke.  The patient therefore  presents today for implantable loop implantation.   DESCRIPTION OF PROCEDURE:  Informed written consent was obtained.  The patient required no sedation for the procedure today.  The patients left chest was prepped and draped. Mapping over the patient's chest was performed to identify the appropriate ILR site.  This area was found to be the left  parasternal region over the 3rd-4th intercostal space.  The skin overlying this region was infiltrated with lidocaine for local analgesia.  A 0.5-cm incision was made at the implant site.  A subcutaneous ILR pocket was fashioned using a combination of sharp and blunt dissection.  A Medtronic Reveal Linq model [id number] implantable loop recorder was then placed into the pocket R waves were very prominent and measured > 0.2 mV. EBL<1 ml.  Steri- Strips and a sterile dressing were then applied.  There were no early apparent complications.   CONCLUSIONS:  1. Successful implantation of a Medtronic Reveal LINQ implantable loop recorder for cryptogenic stroke  2. No early apparent complications. Hillis RangeJames Allred MD, [facility] Blvd[facility]ACC 05/01/2020 2:08 PM   VAS US TRANSCRANIAL DOPPLER W BUBBLES  Result Date: 04/30/2020  Transcranial Doppler with Bubble Indications: Stroke. Comparison Study: No prior studies. Performing Technologist: Jean Rosenthalachel Hodge RDMS  Examination Guidelines: A complete evaluation includes B-mode imaging, spectral Doppler, color Doppler, and power Doppler as needed of all accessible portions of each vessel. Bilateral testing is considered an integral part of a complete examination. Limited examinations for reoccurring indications may be performed as noted.  Summary: No HITS at rest or during Valsalva. Negative transcranial Doppler Bubble study with no evidence of right to left intracardiac communication.  A vascular evaluation was performed. The left middle cerebral artery was studied. An IV was inserted into the patient's left forearm. Verbal informed consent was obtained.  NEGATIVE  TCD Bubble study *See table(s) above for TCD measurements and observations.  Diagnosing physician: Delia HeadyPramod Sethi MD Electronically signed by Delia HeadyPramod Sethi MD on 04/30/2020 at 6:15:51 PM.    Final    ECHOCARDIOGRAM COMPLETE  Result Date: 04/28/2020    ECHOCARDIOGRAM REPORT   Patient Name:   Timothy Baker Date of Exam: 04/28/2020 Medical Rec #:  [id number]          Height: Accession #:    [id number]         Weight: Date of Birth:  [birth year]          BSA: Patient Age:    59 years           BP:           139/88 mmHg Patient Gender: M                  HR:           71 bpm. Exam Location:  Inpatient Procedure: 2D Echo Indications:    stroke  History:        Patient has no prior history of Echocardiogram examinations.                 Risk Factors:Current Smoker.  Sonographer:    Delcie RochLauren Pennington Referring Phys: [id number] JENNIFER YATES IMPRESSIONS  1. Left ventricular  ejection fraction, by estimation, is 60 to 65%. The left ventricle has normal function. The left ventricle has no regional wall motion abnormalities. Left ventricular diastolic parameters are indeterminate.  2. Right ventricular systolic function is normal. The right ventricular size is normal. Tricuspid regurgitation signal is inadequate for assessing PA pressure.  3. The mitral valve is normal in structure. No evidence of mitral valve regurgitation. No evidence of mitral stenosis.  4. The aortic valve is tricuspid. Aortic valve regurgitation is trivial. No aortic stenosis is present.  5. The inferior vena cava is normal in size with greater than 50% respiratory variability, suggesting right atrial pressure of 3 mmHg. FINDINGS  Left Ventricle: Left ventricular ejection fraction, by estimation, is 60 to 65%. The left ventricle has normal function. The left ventricle has no regional wall motion abnormalities. The left ventricular internal cavity size was normal in size. There is  no left ventricular hypertrophy. Left ventricular diastolic parameters are  indeterminate. Right Ventricle: The right ventricular size is normal. No increase in right ventricular wall thickness. Right ventricular systolic function is normal. Tricuspid regurgitation signal is inadequate for assessing PA pressure. The tricuspid regurgitant velocity is 2.28 m/s, and with an assumed right atrial pressure of 3 mmHg, the estimated right ventricular systolic pressure is 23.8 mmHg. Left Atrium: Left atrial size was normal in size. Right Atrium: Right atrial size was normal in size. Pericardium: There is no evidence of pericardial effusion. Mitral Valve: The mitral valve is normal in structure. No evidence of mitral valve regurgitation. No evidence of mitral valve stenosis. Tricuspid Valve: The tricuspid valve is normal in structure. Tricuspid valve regurgitation is trivial. No evidence of tricuspid stenosis. Aortic Valve: The aortic valve is tricuspid. Aortic valve regurgitation is trivial. No aortic stenosis is present. Pulmonic Valve: The pulmonic valve was normal in structure. Pulmonic valve regurgitation is not visualized. No evidence of pulmonic stenosis. Aorta: The aortic root is normal in size and structure. Venous: The inferior vena cava is normal in size with greater than 50% respiratory variability, suggesting right atrial pressure of 3 mmHg. IAS/Shunts: No atrial level shunt detected by color flow Doppler.  LEFT VENTRICLE PLAX 2D LVIDd:         4.20 cm  Diastology LVIDs:         2.70 cm  LV e' medial:  7.83 cm/s LV PW:         1.00 cm  LV e' lateral: 9.14 cm/s LV IVS:        0.90 cm LVOT diam:     2.00 cm LVOT Area:     3.14 cm  RIGHT VENTRICLE             IVC RV S prime:     13.50 cm/s  IVC diam: 1.80 cm TAPSE (M-mode): 1.9 cm LEFT ATRIUM             RIGHT ATRIUM LA diam:        3.40 cm RA Area:     11.80 cm LA Vol (A2C):   26.6 ml RA Volume:   27.20 ml LA Vol (A4C):   43.0 ml LA Biplane Vol: 36.6 ml   AORTA Ao Root diam: 3.10 cm Ao Asc diam:  3.20 cm TRICUSPID VALVE TR Peak grad:    20.8 mmHg TR Vmax:        228.00 cm/s  SHUNTS Systemic Diam: 2.00 cm Olga Millers MD Electronically signed by Olga Millers MD Signature Date/Time: 04/28/2020/11:59:22 AM    Final  VAS [place] LOWER EXTREMITY VENOUS (DVT)  Result Date: 04/28/2020  Lower Venous DVT Study Indications: Stroke.  Risk Factors: None identified. Comparison Study: No previous Performing Technologist: Clint Guy RVT  Examination Guidelines: A complete evaluation includes B-mode imaging, spectral Doppler, color Doppler, and power Doppler as needed of all accessible portions of each vessel. Bilateral testing is considered an integral part of a complete examination. Limited examinations for reoccurring indications may be performed as noted. The reflux portion of the exam is performed with the patient in reverse Trendelenburg.  +---------+---------------+---------+-----------+----------+--------------+ RIGHT    CompressibilityPhasicitySpontaneityPropertiesThrombus Aging +---------+---------------+---------+-----------+----------+--------------+ CFV      Full           Yes      Yes                                 +---------+---------------+---------+-----------+----------+--------------+ SFJ      Full                                                        +---------+---------------+---------+-----------+----------+--------------+ FV Prox  Full                                                        +---------+---------------+---------+-----------+----------+--------------+ FV Mid   Full                                                        +---------+---------------+---------+-----------+----------+--------------+ FV DistalFull                                                        +---------+---------------+---------+-----------+----------+--------------+ PFV      Full                                                         +---------+---------------+---------+-----------+----------+--------------+ POP      Full           Yes      Yes                                 +---------+---------------+---------+-----------+----------+--------------+ PTV      Full                                                        +---------+---------------+---------+-----------+----------+--------------+ PERO     Full                                                        +---------+---------------+---------+-----------+----------+--------------+   +---------+---------------+---------+-----------+----------+--------------+  LEFT     CompressibilityPhasicitySpontaneityPropertiesThrombus Aging +---------+---------------+---------+-----------+----------+--------------+ CFV      Full           Yes      Yes                                 +---------+---------------+---------+-----------+----------+--------------+ SFJ      Full                                                        +---------+---------------+---------+-----------+----------+--------------+ FV Prox  Full                                                        +---------+---------------+---------+-----------+----------+--------------+ FV Mid   Full                                                        +---------+---------------+---------+-----------+----------+--------------+ FV DistalFull                                                        +---------+---------------+---------+-----------+----------+--------------+ PFV      Full                                                        +---------+---------------+---------+-----------+----------+--------------+ POP      Full           Yes      Yes                                 +---------+---------------+---------+-----------+----------+--------------+ PTV      Full                                                         +---------+---------------+---------+-----------+----------+--------------+ PERO     Full                                                        +---------+---------------+---------+-----------+----------+--------------+     Summary: BILATERAL: - No evidence of deep vein thrombosis seen in the lower extremities, bilaterally. -No evidence of popliteal cyst, bilaterally.   *See table(s) above for measurements and observations. Electronically signed by Waverly Ferrari MD on 04/28/2020 at 9:03:37 PM.  Final     Microbiology: Recent Results (from the past 240 hour(s))  SARS CORONAVIRUS 2 (TAT 6-24 HRS) Nasopharyngeal Nasopharyngeal Swab     Status: None   Collection Time: 04/27/20  2:23 PM   Specimen: Nasopharyngeal Swab  Result Value Ref Range Status   SARS Coronavirus 2 NEGATIVE NEGATIVE Final    Comment: (NOTE) SARS-CoV-2 target nucleic acids are NOT DETECTED.  The SARS-CoV-2 RNA is generally detectable in upper and lower respiratory specimens during the acute phase of infection. Negative results do not preclude SARS-CoV-2 infection, do not rule out co-infections with other pathogens, and should not be used as the sole basis for treatment or other patient management decisions. Negative results must be combined with clinical observations, patient history, and epidemiological information. The expected result is Negative.  Fact Sheet for Patients: HairSlick.no  Fact Sheet for Healthcare Providers: quierodirigir.com  This test is not yet approved or cleared by the [place] FDA and  has been authorized for detection and/or diagnosis of SARS-CoV-2 by FDA under an Emergency Use Authorization (EUA). This EUA will remain  in effect (meaning this test can be used) for the duration of the COVID-19 declaration under Se ction 564(b)(1) of the Act, 21 U.S.C. section 360bbb-3(b)(1), unless the authorization is terminated or revoked  sooner.  Performed at [facility], [address] [place], [place] [address]      Labs: CBC: Recent Labs  Lab 04/27/20 1020  WBC 10.7*  NEUTROABS 7.2  HGB 15.4  HCT 46.5  MCV 94.1  PLT 212   Basic Metabolic Panel: Recent Labs  Lab 04/27/20 1020  NA 140  K 3.5  CL 107  CO2 22  GLUCOSE 118*  BUN 9  CREATININE 0.99  CALCIUM 9.0   Liver Function Tests: Recent Labs  Lab 04/27/20 1020  AST 24  ALT 22  ALKPHOS 49  BILITOT 1.3*  PROT 7.2  ALBUMIN 3.4*   CBG: Recent Labs  Lab 04/30/20 0611 04/30/20 2124 05/01/20 0614  GLUCAP 95 103* 104*    Time spent: 35 minutes  Signed:  Lynden Oxford  [facility] 05/01/2020 6:08 PM

## 2020-05-01 NOTE — Consult Note (Addendum)
ELECTROPHYSIOLOGY CONSULT NOTE  Patient ID: Timothy Baker MRN: [id number], DOB/AGE: 1960-09-23   Admit date: 04/27/2020 Date of Consult: 05/01/2020  Primary Physician: Renford Dills, MD Primary Cardiologist: No primary care provider on file.  Primary Electrophysiologist: New to Dr. Johney Frame Reason for Consultation: Cryptogenic stroke; recommendations regarding Implantable Loop Recorder Insurance: [employer]  History of Present Illness EP has been asked to evaluate Timothy Baker for placement of an implantable loop recorder to monitor for atrial fibrillation by Dr Roda Shutters.  The patient was admitted on 04/27/2020 with left sided weakness, left facial droop, and dysarthria x 3 days.    Imaging demonstrated patchy multifocal acute infarcts involving the right CR, SO, and BG.    He has undergone workup for stroke including:   CT head Recent appearing and suspected acute infarct involving a portion ofthe anterior limb of the right internal capsule and immediately adjacent right centrum semiovale.    CTA head & neck: Normal CTA of the head and neck.  MRI  Patchy multifocal acute ischemic nonhemorrhagic infarcts involving the periventricular and deep white matter of the rightcorona radiata and centrum semi ovale, with involvement of the underlying right basal ganglia.   2D Echo EF 60 to 65%. No shunt.   LE venous doppler - no DVT  TEE will be done as outpt after he follows up with Dentistry to avoid aspiration  TCD bubble study no DVT  The patient has been monitored on telemetry which has demonstrated sinus rhythm with no arrhythmias.  Inpatient stroke work-up will require a TEE as outpatient as above. They have requested we proceed with loop recorder.    Echocardiogram as above. Lab work is reviewed.  Prior to admission, the patient denies chest pain, shortness of breath, dizziness, or syncope. Pt had a distant ER visit with palpitations, but this was felt to be in the setting of acute  illness. He is recovering from his stroke with plans to return home  at discharge. Initially pt was recommended for CIR but bed status pushes admission to next week. He has progressed with PT and prefers to go home with HHPT.   Past Medical History:  Diagnosis Date  . Polysubstance abuse (HCC)    quit in 2019 - used ETOH, crack cocaine     Surgical History:  Past Surgical History:  Procedure Laterality Date  . genital surgery       Medications Prior to Admission  Medication Sig Dispense Refill Last Dose  . naproxen sodium (ALEVE) 220 MG tablet Take 440 mg by mouth daily.   04/27/2020 at am  . cyclobenzaprine (FLEXERIL) 10 MG tablet Take 1 tablet (10 mg total) by mouth 2 (two) times daily as needed for up to 20 doses for muscle spasms. (Patient not taking: No sig reported) 20 tablet 0 Not Taking at Unknown time    Inpatient Medications:  .  stroke: mapping our early stages of recovery book   Does not apply Once  . aspirin EC  81 mg Oral Daily  . atorvastatin  40 mg Oral Daily  . clopidogrel  75 mg Oral Daily  . enoxaparin (LOVENOX) injection  40 mg Subcutaneous Q24H  . nicotine  14 mg Transdermal Daily    Allergies: No Known Allergies  Social History   Socioeconomic History  . Marital status: Single    Spouse name: Not on file  . Number of children: Not on file  . Years of education: Not on file  . Highest education level:  Not on file  Occupational History  . Not on file  Tobacco Use  . Smoking status: Current Some Day Smoker    Packs/day: 0.50    Years: 45.00    Pack years: 22.50    Types: Cigarettes  . Smokeless tobacco: Never Used  Substance and Sexual Activity  . Alcohol use: Not Currently    Comment: quit 4 years ago (2018)  . Drug use: Not Currently    Types: "Crack" cocaine    Comment: quit 2018-2019  . Sexual activity: Not on file  Other Topics Concern  . Not on file  Social History Narrative  . Not on file   Social Determinants of Health   Financial  Resource Strain: Not on file  Food Insecurity: Not on file  Transportation Needs: Not on file  Physical Activity: Not on file  Stress: Not on file  Social Connections: Not on file  Intimate Partner Violence: Not on file     Family History  Problem Relation Age of Onset  . Stroke Neg Hx       Review of Systems: All other systems reviewed and are otherwise negative except as noted above.  Physical Exam: Vitals:   04/30/20 1546 04/30/20 1930 04/30/20 2352 05/01/20 0335  BP: 137/79 126/78 123/86 123/81  Pulse: 73 80 79 75  Resp: 18  17 17   Temp: 98.4 F (36.9 C)  97.8 F (36.6 C) (!) 97.5 F (36.4 C)  TempSrc: Oral  Oral Oral  SpO2: 100% 100% 100% 99%  Weight:      Height:        GEN- The patient is well appearing, alert and oriented x 3 today.   Head- normocephalic, atraumatic Eyes-  Sclera clear, conjunctiva pink Ears- hearing intact Oropharynx- clear Neck- supple Lungs- Clear to ausculation bilaterally, normal work of breathing Heart- Regular rate and rhythm, no murmurs, rubs or gallops  GI- soft, NT, ND, + BS Extremities- no clubbing, cyanosis, or edema MS- no significant deformity or atrophy Skin- no rash or lesion Psych- euthymic mood, full affect   Labs:   Lab Results  Component Value Date   WBC 10.7 (H) 04/27/2020   HGB 15.4 04/27/2020   HCT 46.5 04/27/2020   MCV 94.1 04/27/2020   PLT 212 04/27/2020    Recent Labs  Lab 04/27/20 1020  NA 140  K 3.5  CL 107  CO2 22  BUN 9  CREATININE 0.99  CALCIUM 9.0  PROT 7.2  BILITOT 1.3*  ALKPHOS 49  ALT 22  AST 24  GLUCOSE 118*     Radiology/Studies: CT ANGIO HEAD W OR WO CONTRAST  Result Date: 04/27/2020 CLINICAL DATA:  Right hemisphere small vessel infarcts. EXAM: CT ANGIOGRAPHY HEAD AND NECK TECHNIQUE: Multidetector CT imaging of the head and neck was performed using the standard protocol during bolus administration of intravenous contrast. Multiplanar CT image reconstructions and MIPs were  obtained to evaluate the vascular anatomy. Carotid stenosis measurements (when applicable) are obtained utilizing NASCET criteria, using the distal internal carotid diameter as the denominator. CONTRAST:  75mL OMNIPAQUE IOHEXOL 350 MG/ML SOLN COMPARISON:  None. FINDINGS: CTA NECK FINDINGS SKELETON: There is no bony spinal canal stenosis. No lytic or blastic lesion. OTHER NECK: Normal pharynx, larynx and major salivary glands. No cervical lymphadenopathy. Unremarkable thyroid gland. UPPER CHEST: No pneumothorax or pleural effusion. No nodules or masses. AORTIC ARCH: There is no calcific atherosclerosis of the aortic arch. There is no aneurysm, dissection or hemodynamically significant stenosis of the visualized  portion of the aorta. Conventional 3 vessel aortic branching pattern. The visualized proximal subclavian arteries are widely patent. RIGHT CAROTID SYSTEM: Normal without aneurysm, dissection or stenosis. LEFT CAROTID SYSTEM: Normal without aneurysm, dissection or stenosis. VERTEBRAL ARTERIES: Left dominant configuration. Both origins are clearly patent. There is no dissection, occlusion or flow-limiting stenosis to the skull base (V1-V3 segments). CTA HEAD FINDINGS POSTERIOR CIRCULATION: --Vertebral arteries: Normal V4 segments. --Inferior cerebellar arteries: Normal. --Basilar artery: Normal. --Superior cerebellar arteries: Normal. --Posterior cerebral arteries (PCA): Normal. ANTERIOR CIRCULATION: --Intracranial internal carotid arteries: Normal. --Anterior cerebral arteries (ACA): Normal. Both A1 segments are present. Patent anterior communicating artery (a-comm). --Middle cerebral arteries (MCA): Normal. VENOUS SINUSES: As permitted by contrast timing, patent. ANATOMIC VARIANTS: Fetal origins of both posterior cerebral arteries. Review of the MIP images confirms the above findings. IMPRESSION: Normal CTA of the head and neck. Electronically Signed   By: Deatra Robinson M.D.   On: 04/27/2020 19:35   CT HEAD  WO CONTRAST  Result Date: 04/27/2020 CLINICAL DATA:  Left-sided facial droop and slurred speech EXAM: CT HEAD WITHOUT CONTRAST TECHNIQUE: Contiguous axial images were obtained from the base of the skull through the vertex without intravenous contrast. COMPARISON:  None. FINDINGS: Brain: Ventricles and sulci are normal in size and configuration. There is no appreciable intracranial mass, hemorrhage, extra-axial fluid collection, or midline shift. There is decreased attenuation involving the anterior limb of the right internal capsule as well as the immediately adjacent anterior, inferior right centrum semiovale, suspicious for recent and likely acute infarct in this region. Elsewhere brain parenchyma appears unremarkable. Vascular: No hyperdense vessel. There is calcification in each carotid siphon region. Skull: Bony calvarium appears intact. Sinuses/Orbits: Visualized paranasal sinuses are clear. Orbits appear symmetric bilaterally. Other: Mastoid air cells are clear. IMPRESSION: Recent appearing and suspected acute infarct involving a portion of the anterior limb of the right internal capsule and immediately adjacent right centrum semiovale. Elsewhere brain parenchyma appears unremarkable. No mass or hemorrhage demonstrable. There are foci of arterial vascular calcification. Electronically Signed   By: Bretta Bang III M.D.   On: 04/27/2020 11:30   CT ANGIO NECK W OR WO CONTRAST  Result Date: 04/27/2020 CLINICAL DATA:  Right hemisphere small vessel infarcts. EXAM: CT ANGIOGRAPHY HEAD AND NECK TECHNIQUE: Multidetector CT imaging of the head and neck was performed using the standard protocol during bolus administration of intravenous contrast. Multiplanar CT image reconstructions and MIPs were obtained to evaluate the vascular anatomy. Carotid stenosis measurements (when applicable) are obtained utilizing NASCET criteria, using the distal internal carotid diameter as the denominator. CONTRAST:  44mL  OMNIPAQUE IOHEXOL 350 MG/ML SOLN COMPARISON:  None. FINDINGS: CTA NECK FINDINGS SKELETON: There is no bony spinal canal stenosis. No lytic or blastic lesion. OTHER NECK: Normal pharynx, larynx and major salivary glands. No cervical lymphadenopathy. Unremarkable thyroid gland. UPPER CHEST: No pneumothorax or pleural effusion. No nodules or masses. AORTIC ARCH: There is no calcific atherosclerosis of the aortic arch. There is no aneurysm, dissection or hemodynamically significant stenosis of the visualized portion of the aorta. Conventional 3 vessel aortic branching pattern. The visualized proximal subclavian arteries are widely patent. RIGHT CAROTID SYSTEM: Normal without aneurysm, dissection or stenosis. LEFT CAROTID SYSTEM: Normal without aneurysm, dissection or stenosis. VERTEBRAL ARTERIES: Left dominant configuration. Both origins are clearly patent. There is no dissection, occlusion or flow-limiting stenosis to the skull base (V1-V3 segments). CTA HEAD FINDINGS POSTERIOR CIRCULATION: --Vertebral arteries: Normal V4 segments. --Inferior cerebellar arteries: Normal. --Basilar artery: Normal. --Superior cerebellar arteries: Normal. --Posterior  cerebral arteries (PCA): Normal. ANTERIOR CIRCULATION: --Intracranial internal carotid arteries: Normal. --Anterior cerebral arteries (ACA): Normal. Both A1 segments are present. Patent anterior communicating artery (a-comm). --Middle cerebral arteries (MCA): Normal. VENOUS SINUSES: As permitted by contrast timing, patent. ANATOMIC VARIANTS: Fetal origins of both posterior cerebral arteries. Review of the MIP images confirms the above findings. IMPRESSION: Normal CTA of the head and neck. Electronically Signed   By: Deatra Robinson M.D.   On: 04/27/2020 19:35   MR BRAIN WO CONTRAST  Result Date: 04/27/2020 CLINICAL DATA:  Initial evaluation for neuro deficit, acute stroke. EXAM: MRI HEAD WITHOUT CONTRAST TECHNIQUE: Multiplanar, multiecho pulse sequences of the brain and  surrounding structures were obtained without intravenous contrast. COMPARISON:  Prior head CT from earlier the same day. FINDINGS: Brain: Examination degraded by motion artifact. Cerebral volume within normal limits for age. Mild chronic microvascular ischemic disease present within the periventricular and deep white matter both cerebral hemispheres. Patchy multifocal areas of restricted diffusion seen involving the periventricular and deep white matter of the right corona radiata and centrum semi ovale. Mild patchy involvement of the underlying right basal ganglia and subinsular white matter. No associated hemorrhage or mass effect. No other evidence for acute or subacute ischemia. Gray-white matter differentiation otherwise maintained. No other encephalomalacia to suggest chronic cortical infarction. No other evidence for acute or chronic intracranial hemorrhage. No mass lesion, midline shift or mass effect. No hydrocephalus or extra-axial fluid collection. Pituitary gland suprasellar region within normal limits. Midline structures intact. Vascular: Major intracranial vascular flow voids are maintained. Skull and upper cervical spine: Craniocervical junction normal. Bone marrow signal intensity within normal limits. No scalp soft tissue abnormality. Sinuses/Orbits: Globes and orbital soft tissues demonstrate no acute finding. Mild scattered mucosal thickening noted within the ethmoidal air cells and maxillary sinuses. Paranasal sinuses are otherwise clear. No mastoid effusion. Inner ear structures grossly normal. Other: None. IMPRESSION: 1. Patchy multifocal acute ischemic nonhemorrhagic infarcts involving the periventricular and deep white matter of the right corona radiata and centrum semi ovale, with involvement of the underlying right basal ganglia. 2. Underlying mild chronic microvascular ischemic disease. Electronically Signed   By: Rise Mu M.D.   On: 04/27/2020 18:38   VAS [place] TRANSCRANIAL  DOPPLER W BUBBLES  Result Date: 04/30/2020  Transcranial Doppler with Bubble Indications: Stroke. Comparison Study: No prior studies. Performing Technologist: Jean Rosenthal RDMS  Examination Guidelines: A complete evaluation includes B-mode imaging, spectral Doppler, color Doppler, and power Doppler as needed of all accessible portions of each vessel. Bilateral testing is considered an integral part of a complete examination. Limited examinations for reoccurring indications may be performed as noted.  Summary: No HITS at rest or during Valsalva. Negative transcranial Doppler Bubble study with no evidence of right to left intracardiac communication.  A vascular evaluation was performed. The left middle cerebral artery was studied. An IV was inserted into the patient's left forearm. Verbal informed consent was obtained.  NEGATIVE TCD Bubble study *See table(s) above for TCD measurements and observations.  Diagnosing physician: Delia Heady MD Electronically signed by Delia Heady MD on 04/30/2020 at 6:15:51 PM.    Final    ECHOCARDIOGRAM COMPLETE  Result Date: 04/28/2020    ECHOCARDIOGRAM REPORT   Patient Name:   Timothy Baker Date of Exam: 04/28/2020 Medical Rec #:  [id number]          Height: Accession #:    [id number]         Weight: Date of Birth:  1960/06/02  BSA: Patient Age:    59 years           BP:           139/88 mmHg Patient Gender: M                  HR:           71 bpm. Exam Location:  Inpatient Procedure: 2D Echo Indications:    stroke  History:        Patient has no prior history of Echocardiogram examinations.                 Risk Factors:Current Smoker.  Sonographer:    Delcie Roch Referring Phys: [id number] JENNIFER YATES IMPRESSIONS  1. Left ventricular ejection fraction, by estimation, is 60 to 65%. The left ventricle has normal function. The left ventricle has no regional wall motion abnormalities. Left ventricular diastolic parameters are indeterminate.  2. Right ventricular  systolic function is normal. The right ventricular size is normal. Tricuspid regurgitation signal is inadequate for assessing PA pressure.  3. The mitral valve is normal in structure. No evidence of mitral valve regurgitation. No evidence of mitral stenosis.  4. The aortic valve is tricuspid. Aortic valve regurgitation is trivial. No aortic stenosis is present.  5. The inferior vena cava is normal in size with greater than 50% respiratory variability, suggesting right atrial pressure of 3 mmHg. FINDINGS  Left Ventricle: Left ventricular ejection fraction, by estimation, is 60 to 65%. The left ventricle has normal function. The left ventricle has no regional wall motion abnormalities. The left ventricular internal cavity size was normal in size. There is  no left ventricular hypertrophy. Left ventricular diastolic parameters are indeterminate. Right Ventricle: The right ventricular size is normal. No increase in right ventricular wall thickness. Right ventricular systolic function is normal. Tricuspid regurgitation signal is inadequate for assessing PA pressure. The tricuspid regurgitant velocity is 2.28 m/s, and with an assumed right atrial pressure of 3 mmHg, the estimated right ventricular systolic pressure is 23.8 mmHg. Left Atrium: Left atrial size was normal in size. Right Atrium: Right atrial size was normal in size. Pericardium: There is no evidence of pericardial effusion. Mitral Valve: The mitral valve is normal in structure. No evidence of mitral valve regurgitation. No evidence of mitral valve stenosis. Tricuspid Valve: The tricuspid valve is normal in structure. Tricuspid valve regurgitation is trivial. No evidence of tricuspid stenosis. Aortic Valve: The aortic valve is tricuspid. Aortic valve regurgitation is trivial. No aortic stenosis is present. Pulmonic Valve: The pulmonic valve was normal in structure. Pulmonic valve regurgitation is not visualized. No evidence of pulmonic stenosis. Aorta: The  aortic root is normal in size and structure. Venous: The inferior vena cava is normal in size with greater than 50% respiratory variability, suggesting right atrial pressure of 3 mmHg. IAS/Shunts: No atrial level shunt detected by color flow Doppler.  LEFT VENTRICLE PLAX 2D LVIDd:         4.20 cm  Diastology LVIDs:         2.70 cm  LV e' medial:  7.83 cm/s LV PW:         1.00 cm  LV e' lateral: 9.14 cm/s LV IVS:        0.90 cm LVOT diam:     2.00 cm LVOT Area:     3.14 cm  RIGHT VENTRICLE             IVC RV S prime:  13.50 cm/s  IVC diam: 1.80 cm TAPSE (M-mode): 1.9 cm LEFT ATRIUM             RIGHT ATRIUM LA diam:        3.40 cm RA Area:     11.80 cm LA Vol (A2C):   26.6 ml RA Volume:   27.20 ml LA Vol (A4C):   43.0 ml LA Biplane Vol: 36.6 ml   AORTA Ao Root diam: 3.10 cm Ao Asc diam:  3.20 cm TRICUSPID VALVE TR Peak grad:   20.8 mmHg TR Vmax:        228.00 cm/s  SHUNTS Systemic Diam: 2.00 cm Olga Millers MD Electronically signed by Olga Millers MD Signature Date/Time: 04/28/2020/11:59:22 AM    Final    VAS [place] LOWER EXTREMITY VENOUS (DVT)  Result Date: 04/28/2020  Lower Venous DVT Study Indications: Stroke.  Risk Factors: None identified. Comparison Study: No previous Performing Technologist: Clint Guy RVT  Examination Guidelines: A complete evaluation includes B-mode imaging, spectral Doppler, color Doppler, and power Doppler as needed of all accessible portions of each vessel. Bilateral testing is considered an integral part of a complete examination. Limited examinations for reoccurring indications may be performed as noted. The reflux portion of the exam is performed with the patient in reverse Trendelenburg.  +---------+---------------+---------+-----------+----------+--------------+ RIGHT    CompressibilityPhasicitySpontaneityPropertiesThrombus Aging +---------+---------------+---------+-----------+----------+--------------+ CFV      Full           Yes      Yes                                  +---------+---------------+---------+-----------+----------+--------------+ SFJ      Full                                                        +---------+---------------+---------+-----------+----------+--------------+ FV Prox  Full                                                        +---------+---------------+---------+-----------+----------+--------------+ FV Mid   Full                                                        +---------+---------------+---------+-----------+----------+--------------+ FV DistalFull                                                        +---------+---------------+---------+-----------+----------+--------------+ PFV      Full                                                        +---------+---------------+---------+-----------+----------+--------------+ POP  Full           Yes      Yes                                 +---------+---------------+---------+-----------+----------+--------------+ PTV      Full                                                        +---------+---------------+---------+-----------+----------+--------------+ PERO     Full                                                        +---------+---------------+---------+-----------+----------+--------------+   +---------+---------------+---------+-----------+----------+--------------+ LEFT     CompressibilityPhasicitySpontaneityPropertiesThrombus Aging +---------+---------------+---------+-----------+----------+--------------+ CFV      Full           Yes      Yes                                 +---------+---------------+---------+-----------+----------+--------------+ SFJ      Full                                                        +---------+---------------+---------+-----------+----------+--------------+ FV Prox  Full                                                         +---------+---------------+---------+-----------+----------+--------------+ FV Mid   Full                                                        +---------+---------------+---------+-----------+----------+--------------+ FV DistalFull                                                        +---------+---------------+---------+-----------+----------+--------------+ PFV      Full                                                        +---------+---------------+---------+-----------+----------+--------------+ POP      Full           Yes      Yes                                 +---------+---------------+---------+-----------+----------+--------------+  PTV      Full                                                        +---------+---------------+---------+-----------+----------+--------------+ PERO     Full                                                        +---------+---------------+---------+-----------+----------+--------------+     Summary: BILATERAL: - No evidence of deep vein thrombosis seen in the lower extremities, bilaterally. -No evidence of popliteal cyst, bilaterally.   *See table(s) above for measurements and observations. Electronically signed by Waverly Ferrarihristopher Dickson MD on 04/28/2020 at 9:03:37 PM.    Final     12-lead ECG on admission shows sinus tachycardia at 105 bpm (personally reviewed) All prior EKG's in EPIC reviewed with no documented atrial fibrillation  Telemetry NSR 70-80s (personally reviewed)  Assessment and Plan:  1. Cryptogenic stroke The patient presents with cryptogenic stroke.  The patient does not have a TEE planned for this AM.  I spoke at length with the patient about monitoring for afib with an implantable loop recorder.  Risks, benefits, and alteratives to implantable loop recorder were discussed with the patient today.   At this time, the patient is very clear in their decision to proceed with implantable loop recorder.   Wound  care was reviewed with the patient (keep incision clean and dry for 3 days).  Wound check scheduled and entered in AVS. Please call with questions.   Graciella FreerMichael Andrew Tillery, PA-C 05/01/2020 7:49 AM  I have seen, examined the patient, and reviewed the above assessment and plan.  Changes to above are made where necessary.  On exam, RRR.  I agree with Dr Roda ShuttersXu that ILR is indicated for long term monitoring to exclude AF as the cause of the stroke. Risks and benefits to ILR implant including but not limited to bleeding and infection were discussed with the patient who wishes to proceed.  Co Sign: Hillis RangeJames Rhyanna Sorce, MD 05/01/2020 1:41 PM

## 2020-05-01 NOTE — TOC Transition Note (Signed)
Transition of Care [facility]) - CM/SW Discharge Note   Patient Details  Name: Timothy Baker MRN: [id number] Date of Birth: 02/14/1961  Transition of Care [facility]) CM/SW Contact:  Kermit Balo, RN Phone Number: 05/01/2020, 9:21 AM   Clinical Narrative:    Pt discharging home with outpatient therapy through [facility]. Orders in Epic and information on the AVS. 3 in 1 and cane to be delivered to the room per [employer]. Pt states family is going to provide support and supervision at home. He denies any issues with transportation. Pt has transport home today.   gFinal next level of care: OP Rehab Barriers to Discharge: No Barriers Identified   Patient Goals and CMS Choice     Choice offered to / list presented to : Patient  Discharge Placement                       Discharge Plan and Services                DME Arranged: 3-N-1,Cane DME Agency: [employer] Date DME Agency Contacted: 05/01/20   Representative spoke with at DME Agency: Velna Hatchet            Social Determinants of Health (SDOH) Interventions     Readmission Risk Interventions No flowsheet data found.

## 2020-05-01 NOTE — Progress Notes (Signed)
Inpatient Rehab Admissions Coordinator:   Following for my colleague, Ottie Glazier.  Note plans to d/c home today with family.  Will sign off for CIR at this time.   Estill Dooms, PT, DPT Admissions Coordinator [phone] 05/01/20  10:39 AM

## 2020-05-05 ENCOUNTER — Other Ambulatory Visit: Payer: Self-pay

## 2020-05-05 ENCOUNTER — Ambulatory Visit: Payer: BC Managed Care – PPO | Attending: Internal Medicine

## 2020-05-05 DIAGNOSIS — R471 Dysarthria and anarthria: Secondary | ICD-10-CM | POA: Insufficient documentation

## 2020-05-05 DIAGNOSIS — R414 Neurologic neglect syndrome: Secondary | ICD-10-CM | POA: Diagnosis present

## 2020-05-05 DIAGNOSIS — I6389 Other cerebral infarction: Secondary | ICD-10-CM | POA: Diagnosis not present

## 2020-05-05 DIAGNOSIS — M6281 Muscle weakness (generalized): Secondary | ICD-10-CM | POA: Insufficient documentation

## 2020-05-05 DIAGNOSIS — I69318 Other symptoms and signs involving cognitive functions following cerebral infarction: Secondary | ICD-10-CM | POA: Insufficient documentation

## 2020-05-05 DIAGNOSIS — R41841 Cognitive communication deficit: Secondary | ICD-10-CM | POA: Insufficient documentation

## 2020-05-05 DIAGNOSIS — R2681 Unsteadiness on feet: Secondary | ICD-10-CM | POA: Insufficient documentation

## 2020-05-05 DIAGNOSIS — R278 Other lack of coordination: Secondary | ICD-10-CM | POA: Diagnosis present

## 2020-05-05 DIAGNOSIS — I69352 Hemiplegia and hemiparesis following cerebral infarction affecting left dominant side: Secondary | ICD-10-CM | POA: Insufficient documentation

## 2020-05-05 NOTE — Patient Instructions (Signed)
Patient and CG instructed to maintain close S and assist with all transfers, use RLE to scoop LLE for safety during transport and peforrm AROM randomly when watching TV with LLE

## 2020-05-05 NOTE — Therapy (Signed)
[facility] [address] [place], [place], [address] Phone: [phone]   Fax:  [phone]  Physical Therapy Evaluation  Patient Details  Name: Timothy Baker MRN: [id number] Date of Birth: 07-Feb-1961 Referring Provider (PT): Timothy Dills MD   Encounter Date: 05/05/2020   PT End of Session - 05/05/20 1117    Visit Number 1    Number of Visits 16    Date for PT Re-Evaluation 06/04/20    Authorization Type BCBS    PT Start Time 1015    PT Stop Time 1100    PT Time Calculation (min) 45 min    Equipment Utilized During Treatment Gait belt    Activity Tolerance Patient tolerated treatment well;Patient limited by fatigue    Behavior During Therapy Impulsive           Past Medical History:  Diagnosis Date  . Polysubstance abuse (HCC)    quit in 2019 - used ETOH, crack cocaine    Past Surgical History:  Procedure Laterality Date  . genital surgery    . LOOP RECORDER INSERTION N/A 05/01/2020   Procedure: LOOP RECORDER INSERTION;  Surgeon: Timothy Range, MD;  [facility] IN[facility];  Service: Cardiovascular;  Laterality: N/A;    There were no vitals filed for this visit.    Subjective Assessment - 05/05/20 1106    Subjective patient is accompanid by CG Timothy Baker who is assisting with history, patient denies pain but is impulsiveand requires assist and S for transfers, he has a Museum/gallery exhibitions officer and RW for use and was issued a cane at discharge, PLOF I, patient's chief c/o is LLE weakness and imbalance, losing balance to L.  Hs goal is t return to I ambulation and mobility    Patient is accompained by: Family member   [name]   Pertinent History 60 yo male with onset of L side weakness sustained a fall climbing up to change smoke detector battery on 2/5, then on 2/6 came to ED with significant changes in his L side facially and in L extremities.  On imaging his notes were that the infarction was in R anterior limb of  internal capsule.  Pt has memory changes, coordination changes and impulsive behavior.  PMHx:  substance abuse, HTN, HLD, tobacco use    Limitations Standing;Walking    How long can you sit comfortably? unlimited    How long can you stand comfortably? <2 minutes with assist    How long can you walk comfortably? <2 minutes with assist    Patient Stated Goals To return to I ambulation and mobility    Currently in Pain? No/denies    Pain Score 0-No pain              [facility] PT Assessment - 05/05/20 0001      Assessment   Medical Diagnosis CVA    Referring Provider (PT) Timothy Dills MD    Onset Date/Surgical Date 04/25/20    Hand Dominance Left    Prior Therapy CIR      Precautions   Precautions Fall    Precaution Comments impulsive      Balance Screen   Has the patient fallen in the past 6 months Yes    How many times? 1    Has the patient had a decrease in activity level because of a fear of falling?  Yes    Is the patient reluctant to leave their home because of a fear of falling?  Yes      Home Environment   Living Environment Private residence    Living Arrangements Other relatives    Available Help at Discharge Family    Type of Home House    Home Access Level entry    Entrance Stairs-Number of Steps 0    Home Layout One level    Home Equipment Walker - 4 wheels;Walker - standard;Bedside commode      Prior Function   Level of Independence Independent      Cognition   Overall Cognitive Status Impaired/Different from baseline    Area of Impairment Following commands;Safety/judgement    Safety and Judgement Comments impulsive      Sensation   Additional Comments denies sensory changes      Strength   Right/Left Hip Right;Left    Right Hip Flexion 4+/5    Right Hip Extension 4+/5    Right Hip ABduction 4+/5    Left Hip Flexion 3+/5    Left Hip Extension 3+/5    Left Hip ABduction 3+/5    Right/Left Knee Right;Left    Right Knee Flexion 4+/5    Right Knee  Extension 4+/5    Left Knee Flexion 3+/5    Left Knee Extension 3/5    Right Ankle Dorsiflexion 4+/5    Right Ankle Plantar Flexion 4+/5    Left Ankle Dorsiflexion 3-/5    Left Ankle Plantar Flexion 3/5      Bed Mobility   Rolling Right Contact Guard/Touching assist    Rolling Left Contact Guard/Touching assist      Transfers   Transfers Sit to Stand;Stand to Sit;Stand Pivot Transfers    Sit to Stand 4: Min guard;4: Min assist    Stand to Sit 4: Min guard    Stand Pivot Transfers 4: Min guard    Transfer Cueing LLE placement and proper use of UE support                      Objective measurements completed on examination: See above findings.               PT Education - 05/05/20 1112    Education Details Patient and CG educated in eval findings, POC, prognosis as well as additional ordere therapies and their purpose, they are in agreement with treatment goals and purpose    Person(s) Educated Patient   Timothy Baker   Methods Explanation;Demonstration    Comprehension Verbalized understanding;Returned demonstration            PT Short Term Goals - 05/05/20 1131      PT SHORT TERM GOAL #1   Title patient to tranfer STS under S with no physical assist    Baseline Min-Mod A to transfer STS    Time 2    Period Weeks    Status New    Target Date 05/19/20      PT SHORT TERM GOAL #2   Title Patient to demo established HEP follwing development of written exercises based on patients abilities and deficits    Baseline no HEP    Time 2    Period Weeks    Status New    Target Date 05/19/20      PT SHORT TERM GOAL #3   Title Patient to ambulate 156ft x2 with RW and close S of PT    Baseline 161ft with rollator and Min A of PT    Time 2    Period Weeks  Status New    Target Date 05/19/20      PT SHORT TERM GOAL #4   Title Assess DGI and MCTSIB and establish goals    Baseline not tested    Time 2    Period Days    Target Date 05/19/20              PT Long Term Goals - 05/05/20 1135      PT LONG TERM GOAL #1   Title Ambulation of 55400ft with LRAD and S of PT across level ground    Baseline 17415ft with RW and MinA of PT across level surface    Time 8    Period Weeks    Status New    Target Date 06/30/20      PT LONG TERM GOAL #2   Title Patient to increase LLE strength to 4/5 grossly throughout    Baseline 3-3+/5 grossly throughout    Time 8    Period Weeks    Target Date 06/30/20      PT LONG TERM GOAL #3   Title Patient to transfer and mobilize from all surfaces with ony distant S    Baseline patient transfers STS with Min A/CGA and CGA for bed mobility    Time 8    Period Weeks    Status New    Target Date 06/30/20      PT LONG TERM GOAL #4   Title Assess progress towards DGI and MCTSIB goals    Baseline not set    Time 8    Period Weeks    Status New    Target Date 06/30/20                  Plan - 05/05/20 1118    Clinical Impression Statement Patient referred to OPPT following CVA from fall and subsequent hospital stay and inpatient rehab, he is accompanied by sister(?) Timothy Holenne who is able to assit with questions due to underlying cog deficits of patient, he will be staying with Timothy HoleAnne and her mother as he recovers, he has a rollator with broken brakes as well as a walker and bedside commode, he is impulsive with transfers and mobility, but able to follow htrough with commands to promote safety awareness.  He presents with safety awareness defirts, impulsive behavior, decreased LLE strength and coordination but no c/o sensory loss.  He is a good candidate for skilled PT and potential to mprove function and reduce fall risk is good.    Personal Factors and Comorbidities Comorbidity 1    Comorbidities HTN, HLD, CVA    Examination-Activity Limitations Stand;Stairs    Examination-Participation Restrictions Occupation    Stability/Clinical Decision Making Evolving/Moderate complexity    Clinical Decision  Making Moderate    Rehab Potential Good    PT Frequency 2x / week    PT Duration 8 weeks    PT Treatment/Interventions DME Instruction;Gait training;Stair training;Functional mobility training;Therapeutic activities;Therapeutic exercise;Balance training;Neuromuscular re-education;Cognitive remediation;Patient/family education;Orthotic Fit/Training;Dry needling    PT Next Visit Plan reinforce safety with mobility, focus on LLE strength, balance and safe transfers, ambulaiton with RW    PT Home Exercise Plan instructed safety ans assist/S with transfes and mobility. LLE random strengthening and mobility    Recommended Other Services OT, ST ordered    Consulted and Agree with Plan of Care Family member/caregiver;Patient    Family Member Consulted Timothy Baker           Patient will benefit from skilled therapeutic intervention  in order to improve the following deficits and impairments:  Abnormal gait,Decreased balance,Decreased endurance,Decreased mobility,Difficulty walking,Decreased cognition,Decreased knowledge of precautions,Impaired perceived functional ability,Decreased activity tolerance,Decreased coordination,Decreased safety awareness,Decreased strength  Visit Diagnosis: Cerebrovascular accident (CVA) due to other mechanism (HCC)  Unsteadiness on feet     Problem List Patient Active Problem List   Diagnosis Date Noted  . 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 Leukocytosis   . Tobacco abuse   . Prediabetes   . Dyslipidemia   . Tobacco dependence due to cigarettes 04/27/2020    Hildred Laser PT 05/05/2020, 5:48 PM  [facility] [address] [place], [place], [address] Phone: [phone]   Fax:  [phone]  Name: Timothy Baker MRN: [id number] Date of Birth: 1960-04-26

## 2020-05-06 ENCOUNTER — Ambulatory Visit: Payer: BC Managed Care – PPO | Admitting: Speech Pathology

## 2020-05-06 ENCOUNTER — Encounter: Payer: Self-pay | Admitting: Speech Pathology

## 2020-05-06 DIAGNOSIS — R471 Dysarthria and anarthria: Secondary | ICD-10-CM

## 2020-05-06 DIAGNOSIS — I6389 Other cerebral infarction: Secondary | ICD-10-CM | POA: Diagnosis not present

## 2020-05-06 DIAGNOSIS — R41841 Cognitive communication deficit: Secondary | ICD-10-CM

## 2020-05-06 NOTE — Patient Instructions (Signed)
   Loud AH!! 5x twice a day Read 10 sentences focusing on loud volume and slow speed Be louder than you think you should be  SLOW LOUD OVER-ENNUNCIATE PAUSE  Say the following 5x each - loud and over enunciate - open your mouth wide  [place]  BUTTERCUP  CATERPILLAR  BASEBALLL PLAYER  [place]  [place]ANE[place]  SLOW AND BIG - EXAGGERATE YOUR MOUTH, MAKE EACH CONSONANT  Speech exercises - do 5x each, x2-3/day SLOW BIG  SAY THE FOLLOWING- make every sound! Red leather, yellow leather     Purple baby carriage    [place] [employer]ane[employer] Proper copper coffee pot Ripe purple cabbage Three free throws Red Bulb, Blue Bulb Flash Message Six Thick Thistles Stick Double Bubble Gum Cinnamon aluminum linoleum Tying Tape Takes Time A Shifty Salt Shaker   Unique [place] A Three Toed Tree [employer] [address] Bumpers [place]  Seven [place]ty Sailors Sailed the [place] Se[place] Which Wrist Watches are [place] Wrist Watches

## 2020-05-06 NOTE — Therapy (Signed)
[facility] [address] [place], [place], [address] Phone: [phone]   Fax:  [phone]  Speech Language Pathology Evaluation  Patient Details  Name: Timothy Baker MRN: [id number] Date of Birth: 1960/05/11 Referring Provider (SLP): Dr. Marvel Plan   Encounter Date: 05/06/2020   End of Session - 05/06/20 1228    Visit Number 1    Number of Visits 17    Date for SLP Re-Evaluation 07/01/20    SLP Start Time 1015    SLP Stop Time  1101    SLP Time Calculation (min) 46 min    Activity Tolerance Patient tolerated treatment well           Past Medical History:  Diagnosis Date  . Polysubstance abuse (HCC)    quit in 2019 - used ETOH, crack cocaine    Past Surgical History:  Procedure Laterality Date  . genital surgery    . LOOP RECORDER INSERTION N/A 05/01/2020   Procedure: LOOP RECORDER INSERTION;  Surgeon: Hillis Range, MD;  [facility] INV[facility] CV LAB;  Service: Cardiovascular;  Laterality: N/A;    There were no vitals filed for this visit.   Subjective Assessment - 05/06/20 1029    Subjective "I'm on point"    Patient is accompained by: Family member   Brother, Timothy Distance   Currently in Pain? No/denies              SLP Evaluation [facility] - 05/06/20 1029      SLP Visit Information   SLP Received On 05/06/20    Referring Provider (SLP) Dr. Marvel Plan    Onset Date 04/27/20    Medical Diagnosis CVA      Subjective   Subjective "He seems to be doing OK"      General Information   HPI Pt is a 60 y.o. male with PMHx substance abuse, HTN, HLD, tobacco use. Pt presented with stroke-like symptoms including L-sided weakness, dyspahgia, and dysarthria. CT head 2/7: acute infarct involving a portion of the anterior limb of the right internal capsule and immediately adjacent right centrum semiovale. MRI brain 2/7: Patchy multifocal acute ischemic nonhemorrhagic infarcts involving the periventricular and deep  white matter of the right corona radiata and centrum semi ovale, with involvement of the underlying right basal ganglia.    Mobility Status PT eval yesterday      Prior Functional Status   Cognitive/Linguistic Baseline Within functional limits    Type of Home House     Lives With Alone    Available Support Family   living with family since CVA   Education 12th grade    Vocation Full time employment      Cognition   Overall Cognitive Status Impaired/Different from baseline    Area of Impairment Attention;Following commands;Awareness;Problem solving;Safety/judgement    Current Attention Level Selective    Following Commands Follows one step commands consistently;Follows multi-step commands inconsistently    Safety/Judgement Decreased awareness of safety;Decreased awareness of deficits    Awareness Intellectual    Problem Solving Slow processing;Requires verbal cues    Attention Alternating;Divided    Alternating Attention Impaired    Alternating Attention Impairment Verbal basic;Functional basic    Divided Attention Impaired    Divided Attention Impairment Verbal basic;Functional basic    Memory Impaired    Memory Impairment Storage deficit;Decreased recall of new information    Awareness Impaired    Awareness Impairment Intellectual impairment    Problem Solving Impaired    Executive Function  Organizing;Decision Making;Self Monitoring;Self Correcting      Auditory Comprehension   Overall Auditory Comprehension Appears within functional limits for tasks assessed      Reading Comprehension   Reading Status Not tested      Expression   Primary Mode of Expression Verbal      Verbal Expression   Overall Verbal Expression Appears within functional limits for tasks assessed      Written Expression   Dominant Hand Left    Written Expression Not tested    Overall Writen Expression Pt benefitted from felt tip pen and foam built up grip      Oral Motor/Sensory Function   Overall  Oral Motor/Sensory Function Impaired    Labial ROM Reduced left    Labial Symmetry Abnormal symmetry left    Labial Strength Reduced Left    Labial Coordination Reduced    Lingual ROM Within Functional Limits    Lingual Symmetry Within Functional Limits    Lingual Strength Within Functional Limits    Lingual Sensation Within Functional Limits    Lingual Coordination WFL    Facial ROM Within Functional Limits      Motor Speech   Overall Motor Speech Impaired    Respiration Within functional limits    Phonation Low vocal intensity    Resonance Within functional limits    Articulation Impaired    Intelligibility Intelligibility reduced    Word 75-100% accurate    Phrase 75-100% accurate    Sentence 50-74% accurate    Conversation 50-74% accurate    Motor Planning Witnin functional limits    Motor Speech Errors Unaware;Consistent    Effective Techniques Slow rate;Increased vocal intensity;Over-articulate;Pause      Standardized Assessments   Standardized Assessments  Cognitive Linguistic Quick Test      Cognitive Linguistic Quick Test (Ages 18-69)   Attention Mild    Memory WNL    Executive Function Mild    Language WNL    Visuospatial Skills Mild    Severity Rating Total 17    Composite Severity Rating 14.6                           SLP Education - 05/06/20 1726    Education Details HEP    Person(s) Educated Patient   brother Timothy Distance   Methods Explanation;Demonstration;Verbal cues    Comprehension Verbalized understanding;Returned demonstration;Verbal cues required;Need further instruction            SLP Short Term Goals - 05/06/20 1727      SLP SHORT TERM GOAL #1   Title Pt will complete HEP for dysarthria with occasional min A    Time 4    Period Weeks    Status New      SLP SHORT TERM GOAL #2   Title Pt will average 70dB on sentence responses 14/15x with occasional min A    Time 4    Period Weeks    Status New      SLP SHORT TERM GOAL  #3   Title Pt will ustilize compensations for memory to manage schedule, appointments and medication with occasional min A from family and friends    Time 4    Period Weeks    Status New      SLP SHORT TERM GOAL #4   Title Pt will verbalize 3 safety concerns in the home and generate solutions to them (emergent awareness) with occasional min A    Time 4  Period Weeks    Status New      SLP SHORT TERM GOAL #5   Title Pt will be 90% intelligible over 5 minute conversation in mildly noisy environment with occasional min A    Time 4    Period Weeks    Status New            SLP Long Term Goals - 05/06/20 1734      SLP LONG TERM GOAL #1   Title Pt will complete HEP for dysarthria with mod I    Time 8    Period Weeks    Status New      SLP LONG TERM GOAL #2   Title Pt will average 70dB over 15 minute conversation with rare min A    Time 8    Period Weeks    Status New      SLP LONG TERM GOAL #3   Title Pt will verbalize carryover of  anticipatory awareness for 3 safety situations in the home    Time 8    Period Weeks    Status New      SLP LONG TERM GOAL #4   Title Pt will be 95% intellgible over 15 minute conversation in noisy environment with rare min A    Time 8    Period Weeks    Status New      SLP LONG TERM GOAL #5   Title Score on Communicative Effectivess Survey will increase (family to fill out ) by 4 points.    Baseline 16    Time 8    Period Weeks    Status New            Plan - 05/06/20 1231    Clinical Impression Statement [name] "Timothy Baker" is referred for outpt ST due to cognitive and speech impairments s/p CVA. Prior to CVA he was independent is ADL/IADL's and worked full time in a lumber yard. He enjoys Research scientist (life sciences)[employer], Advice workerWesterns, and working in his yard. He is accompanied by her brother, Timothy Baker. They deny swallowing diffiuclties, endorse mild drool. When asked about cognitive changes since his stroke, Timothy Baker replied "I'm on point" and stated he  had no difficulty with communication, indicating reduced intellectual awareness of impairments. Timothy Baker states that Timothy Baker does try to get up and walk without help, and family is reluctant to leave him alone for more than a few minutes, indicating poor safety awareness. Timothy Baker reports family often can not understand Timothy Baker due to low volume and slurrred speech. Converation today averaged low to mid 60's dB (70dB is WNL) down to an inaudible whisper at times. Speech is slurred with imprecise consonants and fast rate.  Timothy Baker filled out the Communicative Effectiveness Survey due to Timothy Baker's cognitive impairments. (Timothy Baker rated his speech as "very effective" when it is obviously not, again showing poor awareness). He scored 16, with having a conversation with family at home and conversing in a noisy environment rated "not at all effective." Today, Timothy Baker presents with moderate mixed dysarthria, judged to be 85% intelligible in conversation, and mild cognitive linguisitic impairment. The Cognitive Linguistic Quick Test revealed mild attention, executive function, and visuospatial impairments. I recommend skilled ST to maximize intelligibility and cognitive communciation skills for safety, to reduce caregiver burden and return to PLOF.    Speech Therapy Frequency 2x / week    Duration 8 weeks   17 visits          Patient will benefit from skilled therapeutic intervention in  order to improve the following deficits and impairments:   Dysarthria and anarthria  Cognitive communication deficit    Problem List Patient Active Problem List   Diagnosis Date Noted  . 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 Leukocytosis   . Tobacco abuse   . Prediabetes   . Dyslipidemia   . Tobacco dependence due to cigarettes 04/27/2020    Davaun Quintela, Radene Journey MS, CCC-SLP 05/06/2020, 5:45 PM  [facility] [address] [place], [place],  [address] Phone: [phone]   Fax:  [phone]  Name: NISAIAH BECHTOL MRN: [id number] Date of Birth: Dec 08, 1960

## 2020-05-12 ENCOUNTER — Ambulatory Visit: Payer: BC Managed Care – PPO | Admitting: Occupational Therapy

## 2020-05-12 ENCOUNTER — Other Ambulatory Visit: Payer: Self-pay

## 2020-05-12 ENCOUNTER — Ambulatory Visit (INDEPENDENT_AMBULATORY_CARE_PROVIDER_SITE_OTHER): Payer: BC Managed Care – PPO | Admitting: Emergency Medicine

## 2020-05-12 ENCOUNTER — Ambulatory Visit: Payer: BC Managed Care – PPO

## 2020-05-12 DIAGNOSIS — I6389 Other cerebral infarction: Secondary | ICD-10-CM

## 2020-05-12 DIAGNOSIS — R2681 Unsteadiness on feet: Secondary | ICD-10-CM

## 2020-05-12 DIAGNOSIS — R278 Other lack of coordination: Secondary | ICD-10-CM

## 2020-05-12 DIAGNOSIS — M6281 Muscle weakness (generalized): Secondary | ICD-10-CM

## 2020-05-12 DIAGNOSIS — I639 Cerebral infarction, unspecified: Secondary | ICD-10-CM

## 2020-05-12 DIAGNOSIS — I69352 Hemiplegia and hemiparesis following cerebral infarction affecting left dominant side: Secondary | ICD-10-CM

## 2020-05-12 DIAGNOSIS — R471 Dysarthria and anarthria: Secondary | ICD-10-CM

## 2020-05-12 DIAGNOSIS — R41841 Cognitive communication deficit: Secondary | ICD-10-CM

## 2020-05-12 DIAGNOSIS — I69318 Other symptoms and signs involving cognitive functions following cerebral infarction: Secondary | ICD-10-CM

## 2020-05-12 DIAGNOSIS — R414 Neurologic neglect syndrome: Secondary | ICD-10-CM

## 2020-05-12 LAB — CUP PACEART INCLINIC DEVICE CHECK
Date Time Interrogation Session: 20220222163341
Implantable Pulse Generator Implant Date: 20220211

## 2020-05-12 NOTE — Therapy (Signed)
[facility] [address] [place], [place], [address] Phone: [phone]   Fax:  [phone]  Speech Language Pathology Treatment  Patient Details  Name: Timothy Baker MRN: [id number] Date of Birth: October 06, 1960 Referring Provider (SLP): Dr. Marvel Baker   Encounter Date: 05/12/2020   End of Session - 05/12/20 1450    Visit Number 2    Number of Visits 17    Date for SLP Re-Evaluation 07/01/20    Authorization Type BCBS           Past Medical History:  Diagnosis Date  . Polysubstance abuse (HCC)    quit in 2019 - used ETOH, crack cocaine    Past Surgical History:  Procedure Laterality Date  . genital surgery    . LOOP RECORDER INSERTION N/A 05/01/2020   Procedure: LOOP RECORDER INSERTION;  Surgeon: Timothy Range, MD;  Location: [facility];  Service: Cardiovascular;  Laterality: N/A;    There were no vitals filed for this visit.   Subjective Assessment - 05/12/20 1351    Subjective "tiring" re: recent appointments    Patient is accompained by: Family member   Timothy Baker   Currently in Pain? No/denies    Pain Score 0-No pain                 ADULT SLP TREATMENT - 05/12/20 1435      General Information   Behavior/Cognition Alert;Impulsive;Pleasant mood;Requires cueing      Treatment Provided   Treatment provided Cognitive-Linquistic      Cognitive-Linquistic Treatment   Treatment focused on Dysarthria    Skilled Treatment SLP reviewed dysarthria HEP provided last session, in which pt and brother reported HEP not completed. SLP instructed patient on completing HEP to maximize outcomes and promote increased carryover. SLP provided instruction and modeling of how to complete HEP exercises. Frequent mod A required to slow rate of speech and min A to increase vocal intensity. Visual cues intermittently required to read line by line and tap each word to slow rate. SLP facilitated completion of  functional phrases, in which pt able to demonstrate improved rate, with ~85% speech intelligibility achieved. Minimal awareness in speech deficits demonstrated.      Assessment / Recommendations / Baker   Baker Continue with current Baker of care      Progression Toward Goals   Progression toward goals Progressing toward goals            SLP Education - 05/12/20 1448    Education Details dysarthria HEP, compensations    Person(s) Educated Patient;Caregiver(s)    Methods Explanation;Demonstration;Handout    Comprehension Verbalized understanding;Returned demonstration;Verbal cues required;Need further instruction            SLP Short Term Goals - 05/12/20 1345      SLP SHORT TERM GOAL #1   Title Pt will complete HEP for dysarthria with occasional min A    Time 4    Period Weeks    Status On-going      SLP SHORT TERM GOAL #2   Title Pt will average 70dB on sentence responses 14/15x with occasional min A    Time 4    Period Weeks    Status On-going      SLP SHORT TERM GOAL #3   Title Pt will utilize compensations for memory to manage schedule, appointments and medication with occasional min A from family and friends    Time 4    Period Weeks  Status On-going      SLP SHORT TERM GOAL #4   Title Pt will verbalize 3 safety concerns in the home and generate solutions to them (emergent awareness) with occasional min A    Time 4    Period Weeks    Status On-going      SLP SHORT TERM GOAL #5   Title Pt will be 90% intelligible over 5 minute conversation in mildly noisy environment with occasional min A    Time 4    Period Weeks    Status On-going            SLP Long Term Goals - 05/12/20 1345      SLP LONG TERM GOAL #1   Title Pt will complete HEP for dysarthria with mod I    Time 8    Period Weeks    Status On-going      SLP LONG TERM GOAL #2   Title Pt will average 70dB over 15 minute conversation with rare min A    Time 8    Period Weeks    Status  On-going      SLP LONG TERM GOAL #3   Title Pt will verbalize carryover of  anticipatory awareness for 3 safety situations in the home    Time 8    Period Weeks    Status On-going      SLP LONG TERM GOAL #4   Title Pt will be 95% intellgible over 15 minute conversation in noisy environment with rare min A    Time 8    Period Weeks    Status On-going      SLP LONG TERM GOAL #5   Title Score on Communicative Effectivess Survey will increase (family to fill out ) by 4 points.    Baseline 16    Time 8    Period Weeks    Status On-going            Baker - 05/12/20 1349    Clinical Impression Statement [name] "Timothy Baker" is referred for outpt ST due to cognitive and speech impairments s/p CVA. Prior to CVA he was independent is ADL/IADL's and worked full time in a lumber yard. He enjoys Research scientist (life sciences), Advice worker, and working in his yard. He is accompanied by her brother, Timothy Baker. They deny swallowing diffiuclties, endorse mild drool. When asked about cognitive changes since his stroke, Timothy Baker replied "I'm on point" and stated he had no difficulty with communication, indicating reduced intellectual awareness of impairments. Timothy Baker states that Timothy Baker does try to get up and walk without help, and family is reluctant to leave him alone for more than a few minutes, indicating poor safety awareness. Timothy Baker reports family often can not understand Timothy Baker due to low volume and slurrred speech. Converation today averaged low to mid 60's dB (70dB is WNL) down to an inaudible whisper at times. Speech is slurred with imprecise consonants and fast rate.  Timothy Baker filled out the Communicative Effectiveness Survey due to Timothy Baker's cognitive impairments. He scored 16, with having a conversationm with family at home and conversing in a noisy environment rated "not at all effective." Today, Timothy Baker presents with moderate mixed dysarthria, judged to be 85% intelligible in conversation, and mild cognitive linguisitic  impairment. The Cognitive Linguistic Quick Test revealed mild attention, executive function, and visuospatial impairments. I recommend skilled ST to maximize intelligibility and cognitive communciation skills for safety, to reduce caregiver burden and return to PLOF.    Speech Therapy Frequency 2x /  week    Duration 8 weeks   17 visits   Treatment/Interventions Diet toleration management by SLP;Environmental controls;Cueing hierarchy;SLP instruction and feedback;Compensatory strategies;Functional tasks;Cognitive reorganization;Compensatory techniques;Internal/external aids;Multimodal communcation approach;Patient/family education    Potential to Achieve Goals Good    SLP Home Exercise Baker dysarthria HEP    Consulted and Agree with Baker of Care Patient;Family member/caregiver           Patient will benefit from skilled therapeutic intervention in order to improve the following deficits and impairments:   Dysarthria and anarthria  Cognitive communication deficit    Problem List Patient Active Problem List   Diagnosis Date Noted  . 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 Leukocytosis   . Tobacco abuse   . Prediabetes   . Dyslipidemia   . Tobacco dependence due to cigarettes 04/27/2020    Janann Colonel, MA CCC-SLP 05/12/2020, 2:50 PM  [facility] [address] [place], [place], [address] Phone: [phone]   Fax:  [phone]   Name: Timothy Baker MRN: [id number] Date of Birth: [birth year]

## 2020-05-12 NOTE — Therapy (Signed)
[facility] [address] [place], [place], [address] Phone: [phone]   Fax:  [phone]  Physical Therapy Treatment  Patient Details  Name: Timothy Baker MRN: [id number] Date of Birth: [birth year] Referring Provider (PT): Renford Dills MD   Encounter Date: 05/12/2020   PT End of Session - 05/12/20 1607    Visit Number 2    Number of Visits 16    Date for PT Re-Evaluation 06/04/20    Authorization Type BCBS    PT Start Time 1230    PT Stop Time 1315    PT Time Calculation (min) 45 min    Equipment Utilized During Treatment Gait belt    Activity Tolerance Patient tolerated treatment well    Behavior During Therapy Impulsive           Past Medical History:  Diagnosis Date  . Polysubstance abuse (HCC)    quit in 2019 - used ETOH, crack cocaine    Past Surgical History:  Procedure Laterality Date  . genital surgery    . LOOP RECORDER INSERTION N/A 05/01/2020   Procedure: LOOP RECORDER INSERTION;  Surgeon: Hillis Range, MD;  Location: [facility];  Service: Cardiovascular;  Laterality: N/A;    There were no vitals filed for this visit.   Subjective Assessment - 05/12/20 1603    Subjective reports he has been walking w/o need of an AD using walls and furniture for support, no pain reported    Patient is accompained by: Family member   [name]   Pertinent History 60 yo male with onset of L side weakness sustained a fall climbing up to change smoke detector battery on 2/5, then on 2/6 came to ED with significant changes in his L side facially and in L extremities.  On imaging his notes were that the infarction was in R anterior limb of internal capsule.  Pt has memory changes, coordination changes and impulsive behavior.  PMHx:  substance abuse, HTN, HLD, tobacco use    Limitations Standing;Walking    How long can you sit comfortably? unlimited    How long can you stand comfortably? <2 minutes  with assist    How long can you walk comfortably? <2 minutes with assist    Patient Stated Goals To return to I ambulation and mobility    Currently in Pain? No/denies    Pain Score 0-No pain                             [facility] Adult PT Treatment/Exercise - 05/12/20 0001      Transfers   Transfers Sit to Stand    Sit to Stand 4: Min guard    Comments able to stand with close guard, VCs to control descent, 2x10      Ambulation/Gait   Ambulation/Gait Yes    Ambulation/Gait Assistance 4: Min assist    Ambulation/Gait Assistance Details SPC to/from equipment    Ambulation Distance (Feet) 15 Feet    Assistive device Straight cane    Gait Pattern Step-to pattern;Step-through pattern    Ambulation Surface Level;Indoor      Knee/Hip Exercises: Seated   Long Arc Quad Strengthening;AROM;Both;2 sets;15 reps    Long [employer] Limitations performed with ball squeeze    Heel Slides AROM;Strengthening;Both;2 sets;15 reps    Heel Slides Limitations required TCs tomaintain midline on LLE    Marching AROM;Strengthening;2 sets;15 reps    Marching Limitations  VCs to maintain count      Knee/Hip Exercises: Supine   Short Arc Quad Sets AROM;Strengthening;Both;2 sets;15 reps    Heel Slides AROM;Strengthening;Both;2 sets;15 reps    Heel Slides Limitations towel to overcome friction    Terminal Knee Extension AROM;Strengthening;Both;2 sets;15 reps    Terminal Knee Extension Limitations over bolster    Bridges AROM;Strengthening;Both;2 sets;15 reps    Other Supine Knee/Hip Exercises hip fallouts with green band, supine 2x15                  PT Education - 05/12/20 1606    Education Details see above exercise program    Person(s) Educated Patient;Caregiver(s)    Methods Explanation;Demonstration;Handout    Comprehension Verbalized understanding;Returned demonstration;Verbal cues required;Tactile cues required            PT Short Term Goals - 05/05/20 1131      PT  SHORT TERM GOAL #1   Title patient to tranfer STS under S with no physical assist    Baseline Min-Mod A to transfer STS    Time 2    Period Weeks    Status New    Target Date 05/19/20      PT SHORT TERM GOAL #2   Title Patient to demo established HEP follwing development of written exercises based on patients abilities and deficits    Baseline no HEP    Time 2    Period Weeks    Status New    Target Date 05/19/20      PT SHORT TERM GOAL #3   Title Patient to ambulate 159ft x2 with RW and close S of PT    Baseline 116ft with rollator and Min A of PT    Time 2    Period Weeks    Status New    Target Date 05/19/20      PT SHORT TERM GOAL #4   Title Assess DGI and MCTSIB and establish goals    Baseline not tested    Time 2    Period Days    Target Date 05/19/20             PT Long Term Goals - 05/05/20 1135      PT LONG TERM GOAL #1   Title Ambulation of 544ft with LRAD and S of PT across level ground    Baseline 131ft with RW and MinA of PT across level surface    Time 8    Period Weeks    Status New    Target Date 06/30/20      PT LONG TERM GOAL #2   Title Patient to increase LLE strength to 4/5 grossly throughout    Baseline 3-3+/5 grossly throughout    Time 8    Period Weeks    Target Date 06/30/20      PT LONG TERM GOAL #3   Title Patient to transfer and mobilize from all surfaces with ony distant S    Baseline patient transfers STS with Min A/CGA and CGA for bed mobility    Time 8    Period Weeks    Status New    Target Date 06/30/20      PT LONG TERM GOAL #4   Title Assess progress towards DGI and MCTSIB goals    Baseline not set    Time 8    Period Weeks    Status New    Target Date 06/30/20  Plan - 05/12/20 1608    Clinical Impression Statement continues to demo impulsive behavior, focus or treatment was establishing HEP for strength and balance, began in seaated and supine due to impulsiveness, did ambulate from mat  to machines with SPC and MinA    Personal Factors and Comorbidities Comorbidity 3+    Comorbidities HTN, HLD, CVA    Examination-Activity Limitations Stand;Stairs    Examination-Participation Restrictions Occupation    Stability/Clinical Decision Making Evolving/Moderate complexity    Rehab Potential Good    PT Frequency 2x / week    PT Duration 8 weeks    PT Treatment/Interventions DME Instruction;Gait training;Stair training;Functional mobility training;Therapeutic activities;Therapeutic exercise;Balance training;Neuromuscular re-education;Cognitive remediation;Patient/family education;Orthotic Fit/Training;Dry needling    PT Next Visit Plan f/u with HEP, seate dand standing balance tasks, transfers and ambulation with cane    PT Home Exercise Plan issued HEP as documented elsewhere    Consulted and Agree with Plan of Care Family member/caregiver;Patient           Patient will benefit from skilled therapeutic intervention in order to improve the following deficits and impairments:  Abnormal gait,Decreased balance,Decreased endurance,Decreased mobility,Difficulty walking,Decreased cognition,Decreased knowledge of precautions,Impaired perceived functional ability,Decreased activity tolerance,Decreased coordination,Decreased safety awareness,Decreased strength  Visit Diagnosis: Unsteadiness on feet  Cerebrovascular accident (CVA) due to other mechanism [facility])     Problem List Patient Active Problem List   Diagnosis Date Noted  . 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 Leukocytosis   . Tobacco abuse   . Prediabetes   . Dyslipidemia   . Tobacco dependence due to cigarettes 04/27/2020    Hildred Laser PT 05/12/2020, 4:17 PM  [facility] [address] [place], [place], [address] Phone: [phone]   Fax:  [phone]  Name: TAYTEN BERGDOLL MRN: [id number] Date of Birth: 03-21-1961

## 2020-05-12 NOTE — Patient Instructions (Signed)
Instructed in LAQs, seated marching, supine heelslides, SAQs, hip fallouts and bridging to be performed 2x15 BID

## 2020-05-12 NOTE — Therapy (Signed)
[facility] [address] [place], [place], [address] Phone: [phone]   Fax:  [phone]  Occupational Therapy Evaluation  Patient Details  Name: Timothy Baker MRN: [id number] Date of Birth: July 12, 1960 Referring Provider (OT): Dr. Marvel Plan   Encounter Date: 05/12/2020   OT End of Session - 05/12/20 1522    Visit Number 1    Number of Visits 16    Date for OT Re-Evaluation 07/10/20    Authorization Type BC/BS - VL 30 combined PT/OT (Speech has 30)    Authorization - Visit Number 1    Authorization - Number of Visits 15    OT Start Time 1315    OT Stop Time 1400    OT Time Calculation (min) 45 min    Activity Tolerance Patient tolerated treatment well    Behavior During Therapy Impulsive           Past Medical History:  Diagnosis Date  . Polysubstance abuse (HCC)    quit in 2019 - used ETOH, crack cocaine    Past Surgical History:  Procedure Laterality Date  . genital surgery    . LOOP RECORDER INSERTION N/A 05/01/2020   Procedure: LOOP RECORDER INSERTION;  Surgeon: Hillis Range, MD;  [facility];  Service: Cardiovascular;  Laterality: N/A;    There were no vitals filed for this visit.   Subjective Assessment - 05/12/20 1315    Patient is accompanied by: Family member   brother   Pertinent History CVA 04/27/20 with Lt hemiplegia. PMH: HTN, HLD, substance abuse    Limitations loop recorder 05/01/20, no driving    Currently in Pain? No/denies             [facility] OT Assessment - 05/12/20 0001      Assessment   Medical Diagnosis CVA    Referring Provider (OT) Dr. Marvel Plan    Onset Date/Surgical Date 04/25/20    Hand Dominance Left    Next MD Visit 06/02/20    Prior Therapy CIR      Precautions   Precautions Fall    Precaution Comments impulsive, loop recorder, no driving      Restrictions   Weight Bearing Restrictions No      Home  Environment   Bathroom Risk manager   tub/shower at [employer]   Additional Comments Pt is staying with mother currently but was living alone prior to stroke. Mothers is 1 story, pt's is 1 story as well.      Prior Function   Level of Independence Independent    Vocation Full time employment    Investment banker, corporate      ADL   Eating/Feeding Modified independent   mostly use Rt hand since stroke   Grooming Modified independent   using Rt non dominant hand   Upper Body Bathing Modified independent   sponge bathing at [facility]   Lower Body Bathing Modified independent    Upper Body Dressing Increased time    Lower Body Dressing Minimal assistance   get feet in, buttons/fastners     IADL   Shopping --   sister does grocery shopping   Light Housekeeping Does not participate in any housekeeping tasks    Meal Prep Needs to have meals prepared and served    [employer] on family or friends for transportation    Medication Management Has difficulty remembering to take medication      Mobility  Mobility Status Needs assist    Mobility Status Comments uses rollator some in house, w/c in house and community      Written Expression   Dominant Hand Left    Handwriting Severe micrographia;50% legible   but always wrote small per brother report     Vision - History   Baseline Vision Bifocals    Additional Comments pt denies change but brother reports going to Lt side      Vision Assessment   Comment letter cancellation 64M print size w/ only one error but marking to the left of the targeted letter      Cognition   Overall Cognitive Status Impaired/Different from baseline    Cognition Comments decreased awareness, decreased memory, impulsive. Lt inattention/spatial deficits      Observation/Other Assessments   Observations holds pen b/t index and long finger but brother reports he has always done this. Letter cancellation 64M w/ 95% accuracy but marking slightly to Lt and up  of letter      Sensation   Light Touch Appears Intact   Lt hand   Proprioception Impaired by gross assessment      Coordination   9 Hole Peg Test Right;Left    Right 9 Hole Peg Test 38.22 sec    Left 9 Hole Peg Test 49.44 sec      Perception   Perception Impaired    Inattention/Neglect Does not attend to left side of body      Edema   Edema none      ROM / Strength   AROM / PROM / Strength AROM;Strength      AROM   Overall AROM Comments BUE AROM WFL's except decreased shoulder end range. Pt also w/ decreased attention to Lt side.      Strength   Overall Strength Comments RUE MMT grossly 5/5, LUE MMT grossly 3 to 3+/5      Hand Function   Right Hand Grip (lbs) 81 lbs    Left Hand Grip (lbs) 72 lbs                             OT Short Term Goals - 05/12/20 1528      OT SHORT TERM GOAL #1   Title Independent with HEP for Lt hand coordination and strength    Time 4    Period Weeks    Status New      OT SHORT TERM GOAL #2   Title Independent with LUE shoulder HEP    Time 4    Period Weeks    Status New      OT SHORT TERM GOAL #3   Title Pt to use Lt hand to feed self and groom 50% of the time    Baseline using Rt non dominant hand    Time 4    Period Weeks    Status New      OT SHORT TERM GOAL #4   Title Pt to make snack and perform light IADLS (washing dishes, folding towels) from standing position safely    Time 4    Period Weeks    Status New      OT SHORT TERM GOAL #5   Title Pt to perform LE dressing at mod I level w/ A/E prn    Time 4    Period Weeks    Status New      Additional Short Term Goals   Additional Short  Term Goals Yes      OT SHORT TERM GOAL #6   Title Pt to perform walker negotiation and environmental scanning through tight spaces w/ 85% accuracy and no bumps on Lt side    Time 4    Period Weeks    Status New             OT Long Term Goals - 05/12/20 1531      OT LONG TERM GOAL #1   Title Pt to improve  coordination Lt hand as evidenced by performing 9 hole peg test in under 40 sec    Baseline 49.44 sec    Time 8    Period Weeks    Status New      OT LONG TERM GOAL #2   Title Pt to improve grip strength Lt hand to 80 lbs or greater    Baseline 72 lbs (Rt = 81)    Time 8    Period Weeks    Status New      OT LONG TERM GOAL #3   Title Pt to return to simple cooking tasks and IADLS safely w/ DME/AE prn in prep for return to living alone    Time 8    Period Weeks    Status New      OT LONG TERM GOAL #4   Title Independent with memory compensatory strategies for medication management and paying bills    Time 8    Period Weeks    Status New      OT LONG TERM GOAL #5   Title Pt to use Lt dominant hand 90% for eating and grooming tasks    Time 8    Period Weeks    Status New                 Plan - 05/12/20 1524    Clinical Impression Statement Pt is a 60 yo male with onset of L side weakness sustained a fall climbing up to change smoke detector battery on 2/5, then on 2/6 came to ED with significant changes in his L side facially and in L extremities.  On imaging his notes were that the infarction was in R anterior limb of internal capsule.  Pt has memory changes, coordination changes and impulsive behavior.  PMHx:  substance abuse, HTN, HLD, tobacco use. Pt now presents to OPOT s/p CVA w/ Lt dominant side hemiparesis, deficits in coordination, balance, strength, and cognition. Pt would benefit from O.T. to address these deficits and improve IADLS in hopes of returning to living alone and possible driving.    OT Occupational Profile and History Detailed Assessment- Review of Records and additional review of physical, cognitive, psychosocial history related to current functional performance    Occupational performance deficits (Please refer to evaluation for details): ADL's;IADL's;Work    Body Structure / Function / Physical Skills ADL;Strength;Dexterity;Balance;Body  mechanics;Proprioception;UE functional use;IADL;ROM;Endurance;Coordination;Mobility;FMC;Decreased knowledge of precautions    Cognitive Skills Attention;Safety Awareness;Memory;Perception    Rehab Potential Good    Clinical Decision Making Several treatment options, min-mod task modification necessary    Comorbidities Affecting Occupational Performance: May have comorbidities impacting occupational performance    Modification or Assistance to Complete Evaluation  Min-Moderate modification of tasks or assist with assess necessary to complete eval    OT Frequency 2x / week    OT Duration 8 weeks    OT Treatment/Interventions Self-care/ADL training;DME and/or AE instruction;Therapeutic activities;Aquatic Therapy;Therapeutic exercise;Cognitive remediation/compensation;Coping strategies training;Neuromuscular education;Functional Mobility Training;Passive range of motion;Visual/perceptual remediation/compensation;Patient/family  education;Manual Therapy;Moist Heat    Plan coordination and putty HEP Lt hand    Consulted and Agree with Plan of Care Patient           Patient will benefit from skilled therapeutic intervention in order to improve the following deficits and impairments:   Body Structure / Function / Physical Skills: ADL,Strength,Dexterity,Balance,Body mechanics,Proprioception,UE functional use,IADL,ROM,Endurance,Coordination,Mobility,FMC,Decreased knowledge of precautions Cognitive Skills: Attention,Safety Awareness,Memory,Perception     Visit Diagnosis: Hemiplegia and hemiparesis following cerebral infarction affecting left dominant side (HCC)  Unsteadiness on feet  Other lack of coordination  Muscle weakness (generalized)  Neurologic neglect syndrome  Other symptoms and signs involving cognitive functions following cerebral infarction    Problem List Patient Active Problem List   Diagnosis Date Noted  . 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  Leukocytosis   . Tobacco abuse   . Prediabetes   . Dyslipidemia   . Tobacco dependence due to cigarettes 04/27/2020    Kelli Churn, OTR/L 05/12/2020, 3:34 PM  [facility] [address] [place], [place], [address] Phone: [phone]   Fax:  [phone]  Name: Timothy Baker MRN: [id number] Date of Birth: 08-12-1960

## 2020-05-12 NOTE — Progress Notes (Signed)
ILR wound check in clinic. Steri strips removed prior to visit. Wound well healed. Home monitor transmitting nightly. No episodes. Questions answered. 

## 2020-05-12 NOTE — Patient Instructions (Addendum)
1) What are you cooking? 2) Hey! How about you put a few ice cubes in my lemonade? 3) I need to go to the restroom. 4) I'm tired. I'm going to lay down.  5) What time is my appointment tomorrow? 6) Which one of yall am I going to ride with? 7) I think I'll sleep good tonight.  8) Who is in the bathroom? 9) Did I get any mail? 10) How about you bring me some ice cream? 11) I need to work on my exercises.   Practice LOUD & SLOW

## 2020-05-15 ENCOUNTER — Ambulatory Visit: Payer: BC Managed Care – PPO | Admitting: Speech Pathology

## 2020-05-15 ENCOUNTER — Other Ambulatory Visit: Payer: Self-pay

## 2020-05-15 ENCOUNTER — Ambulatory Visit: Payer: BC Managed Care – PPO

## 2020-05-15 DIAGNOSIS — R2681 Unsteadiness on feet: Secondary | ICD-10-CM

## 2020-05-15 DIAGNOSIS — R41841 Cognitive communication deficit: Secondary | ICD-10-CM

## 2020-05-15 DIAGNOSIS — I6389 Other cerebral infarction: Secondary | ICD-10-CM | POA: Diagnosis not present

## 2020-05-15 DIAGNOSIS — M6281 Muscle weakness (generalized): Secondary | ICD-10-CM

## 2020-05-15 DIAGNOSIS — R471 Dysarthria and anarthria: Secondary | ICD-10-CM

## 2020-05-15 NOTE — Therapy (Signed)
[facility] [address] [place], [place], [address] Phone: [phone]   Fax:  [phone]  Speech Language Pathology Treatment  Patient Details  Name: Timothy Baker MRN: [id number] Date of Birth: 1960/04/10 Referring Provider (SLP): Dr. Marvel Plan   Encounter Date: 05/15/2020   End of Session - 05/15/20 1407    Visit Number 3    Number of Visits 17    Date for SLP Re-Evaluation 07/01/20    Authorization Type BCBS    SLP Start Time 1015    SLP Stop Time  1100    SLP Time Calculation (min) 45 min    Activity Tolerance Patient tolerated treatment well           Past Medical History:  Diagnosis Date  . Polysubstance abuse (HCC)    quit in 2019 - used ETOH, crack cocaine    Past Surgical History:  Procedure Laterality Date  . genital surgery    . LOOP RECORDER INSERTION N/A 05/01/2020   Procedure: LOOP RECORDER INSERTION;  Surgeon: Hillis Range, MD;  [facility]: [facility];  Service: Cardiovascular;  Laterality: N/A;    There were no vitals filed for this visit.   Subjective Assessment - 05/15/20 1406    Subjective "I got stuck on baby buggy: re: dyarthria HEP    Patient is accompained by: Family member   [name]   Currently in Pain? No/denies    Pain Score 0-No pain                 ADULT SLP TREATMENT - 05/15/20 1047      General Information   Behavior/Cognition Alert;Impulsive;Pleasant mood;Requires cueing      Treatment Provided   Treatment provided Cognitive-Linquistic      Cognitive-Linquistic Treatment   Treatment focused on Dysarthria;Patient/family/caregiver education    Skilled Treatment SLP reviewed HEP (functional phrases and tongue twisters), with pt reporting completion with practice of "loud and slow." SLP introduced abdominal breathing (AB) with Mod A fading to min A required to improve accuracy and carryover throughout session. Loud /a/ targeted, with pt able  to achieve low 90s DB x5 reps with occasional verbal cues for AB. Pt read functional sentences with occasional min A for AB and to reduce rate. SLP cued tapping each word to reduce rate, which was intermittently effective. Pt maintained average of 72 DB for sentence level. In conversation, pt averaged high 60 DB. Increased cues required due to fast rate and decreased volume. SLP educated brother on family providing non-verbal cues (cup ear) to cue patient in conversation. Recommended to continue dysarthria HEP.      Assessment / Recommendations / Plan   Plan Continue with current plan of care      Progression Toward Goals   Progression toward goals Progressing toward goals            SLP Education - 05/15/20 1406    Education Details abdominal breathing, dysarthria HEP, compensations    Person(s) Educated Patient;Caregiver(s)    Methods Explanation;Demonstration;Handout;Verbal cues    Comprehension Verbalized understanding;Returned demonstration;Verbal cues required;Need further instruction            SLP Short Term Goals - 05/15/20 1013      SLP SHORT TERM GOAL #1   Title Pt will complete HEP for dysarthria with occasional min A    Time 4    Period Weeks    Status On-going      SLP SHORT TERM GOAL #2  Title Pt will average 70dB on sentence responses 14/15x with occasional min A    Time 4    Period Weeks    Status On-going      SLP SHORT TERM GOAL #3   Title Pt will utilize compensations for memory to manage schedule, appointments and medication with occasional min A from family and friends    Time 4    Period Weeks    Status On-going      SLP SHORT TERM GOAL #4   Title Pt will verbalize 3 safety concerns in the home and generate solutions to them (emergent awareness) with occasional min A    Time 4    Period Weeks    Status On-going      SLP SHORT TERM GOAL #5   Title Pt will be 90% intelligible over 5 minute conversation in mildly noisy environment with occasional  min A    Time 4    Period Weeks    Status On-going            SLP Long Term Goals - 05/15/20 1013      SLP LONG TERM GOAL #1   Title Pt will complete HEP for dysarthria with mod I    Time 8    Period Weeks    Status On-going      SLP LONG TERM GOAL #2   Title Pt will average 70dB over 15 minute conversation with rare min A    Time 8    Period Weeks    Status On-going      SLP LONG TERM GOAL #3   Title Pt will verbalize carryover of  anticipatory awareness for 3 safety situations in the home    Time 8    Period Weeks    Status On-going      SLP LONG TERM GOAL #4   Title Pt will be 95% intellgible over 15 minute conversation in noisy environment with rare min A    Time 8    Period Weeks    Status On-going      SLP LONG TERM GOAL #5   Title Score on Communicative Effectivess Survey will increase (family to fill out ) by 4 points.    Baseline 16    Time 8    Period Weeks    Status On-going            Plan - 05/15/20 1407    Clinical Impression Statement [name] "[name]" presents with moderate mixed dysarthria c/b imprecise articulation, fast rate of speech, and decreased vocal intenstiy. Pt completed dysarthria HEP as instructed last session. SLP introduced abdominal breathing for adequate breath support to maximize vocal loudness and clarity. Mod A required to comprehend and implement AB for sentence level. Functional sentences were in low to mid 70 DB range with occasional cued use of AB and slow rate via tracking/tapping each word. Conversation today averaged low to mid 60's dB (70dB is WNL), which improved with non-verbal SLP cues (cupped ear) that naturally increased volume and decreased rate. SLP recommends skilled ST to maximize intelligibility and cognitive communciation skills for safety, to reduce caregiver burden and return to PLOF.    Speech Therapy Frequency 2x / week    Duration 8 weeks    Treatment/Interventions Diet toleration management by  SLP;Environmental controls;Cueing hierarchy;SLP instruction and feedback;Compensatory strategies;Functional tasks;Cognitive reorganization;Compensatory techniques;Internal/external aids;Multimodal communcation approach;Patient/family education    Potential to Achieve Goals Good    SLP Home Exercise Plan dysarthria HEP  Consulted and Agree with Plan of Care Patient;Family member/caregiver           Patient will benefit from skilled therapeutic intervention in order to improve the following deficits and impairments:   Dysarthria and anarthria  Cognitive communication deficit    Problem List Patient Active Problem List   Diagnosis Date Noted  . 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 Leukocytosis   . Tobacco abuse   . Prediabetes   . Dyslipidemia   . Tobacco dependence due to cigarettes 04/27/2020    Janann Colonel, MA CCC-SLP 05/15/2020, 2:13 PM  [facility] [address] [place], [place], [address] Phone: [phone]   Fax:  [phone]   Name: Timothy Baker MRN: [id number] Date of Birth: 08/19/1960

## 2020-05-15 NOTE — Therapy (Signed)
[facility] [address] [place], [place], [address] Phone: [phone]   Fax:  [phone]  Physical Therapy Treatment  Patient Details  Name: Timothy Baker MRN: [id number] Date of Birth: 10-29-1960 Referring Provider (PT): Renford Dills MD   Encounter Date: 05/15/2020   PT End of Session - 05/15/20 1205    Visit Number 3    Number of Visits 16    Date for PT Re-Evaluation 06/04/20    Authorization Type BCBS    PT Start Time 1100    PT Stop Time 1145    PT Time Calculation (min) 45 min    Equipment Utilized During Treatment Gait belt    Activity Tolerance Patient tolerated treatment well    Behavior During Therapy Impulsive;WFL for tasks assessed/performed           Past Medical History:  Diagnosis Date  . Polysubstance abuse (HCC)    quit in 2019 - used ETOH, crack cocaine    Past Surgical History:  Procedure Laterality Date  . genital surgery    . LOOP RECORDER INSERTION N/A 05/01/2020   Procedure: LOOP RECORDER INSERTION;  Surgeon: Hillis Range, MD;  [facility];  Service: Cardiovascular;  Laterality: N/A;    There were no vitals filed for this visit.   Subjective Assessment - 05/15/20 1204    Subjective no pain or med changes to report, has been walking from [facility] to bathroom, room to room distances using furniture assist as he has no CG to assist him at times    Patient is accompained by: Family member   sister [name]   Pertinent History 60 yo male with onset of L side weakness sustained a fall climbing up to change smoke detector battery on 2/5, then on 2/6 came to ED with significant changes in his L side facially and in L extremities.  On imaging his notes were that the infarction was in R anterior limb of internal capsule.  Pt has memory changes, coordination changes and impulsive behavior.  PMHx:  substance abuse, HTN, HLD, tobacco use    Limitations Standing;Walking    How long  can you sit comfortably? unlimited    How long can you stand comfortably? <2 minutes with assist    How long can you walk comfortably? <2 minutes with assist    Patient Stated Goals To return to I ambulation and mobility    Currently in Pain? No/denies                             [facility] Adult PT Treatment/Exercise - 05/15/20 0001      Transfers   Transfers Sit to Stand    Sit to Stand 4: Min guard    Stand to Sit 4: Min guard;With upper extremity assist;Without upper extremity assist    Comments stands using RUE support, encouraged eccentric sitting w/o UE support, 2x15      Ambulation/Gait   Ambulation/Gait Yes    Ambulation/Gait Assistance 4: Min guard    Ambulation/Gait Assistance Details facilitated WS R to allow LLE clearance    Ambulation Distance (Feet) 230 Feet    Assistive device Straight cane    Gait Pattern Decreased arm swing - left;Decreased step length - left;Decreased hip/knee flexion - left    Ambulation Surface Level;Indoor    Gait Comments 172ftx2      Knee/Hip Exercises: Seated   Long Arc Quad Strengthening;Both;2 sets;15 reps;Limitations  Long [employer] Limitations performed concurrntly to facilitate full TKE on L      Knee/Hip Exercises: Supine   Quad Sets Strengthening;Both;2 sets;15 reps    Quad Sets Limitations performed B, 3s hold , 2x15    Bridges Strengthening;Both;2 sets;15 reps    Other Supine Knee/Hip Exercises 2x15 performed against light manual resistance LLE    Other Supine Knee/Hip Exercises supine marching, alt. 15reps/leg, 2x15               Balance Exercises - 05/15/20 0001      Balance Exercises: Standing   Sidestepping Upper extremity support;5 reps;Limitations    Sidestepping Limitations performed in // bars, B UE support 5 trips    Other Standing Exercises in // bars fwd stepping and WS'ing 15x per LE               PT Short Term Goals - 05/05/20 1131      PT SHORT TERM GOAL #1   Title patient to  tranfer STS under S with no physical assist    Baseline Min-Mod A to transfer STS    Time 2    Period Weeks    Status New    Target Date 05/19/20      PT SHORT TERM GOAL #2   Title Patient to demo established HEP follwing development of written exercises based on patients abilities and deficits    Baseline no HEP    Time 2    Period Weeks    Status New    Target Date 05/19/20      PT SHORT TERM GOAL #3   Title Patient to ambulate 166ft x2 with RW and close S of PT    Baseline 155ft with rollator and Min A of PT    Time 2    Period Weeks    Status New    Target Date 05/19/20      PT SHORT TERM GOAL #4   Title Assess DGI and MCTSIB and establish goals    Baseline not tested    Time 2    Period Days    Target Date 05/19/20             PT Long Term Goals - 05/05/20 1135      PT LONG TERM GOAL #1   Title Ambulation of 564ft with LRAD and S of PT across level ground    Baseline 142ft with RW and MinA of PT across level surface    Time 8    Period Weeks    Status New    Target Date 06/30/20      PT LONG TERM GOAL #2   Title Patient to increase LLE strength to 4/5 grossly throughout    Baseline 3-3+/5 grossly throughout    Time 8    Period Weeks    Target Date 06/30/20      PT LONG TERM GOAL #3   Title Patient to transfer and mobilize from all surfaces with ony distant S    Baseline patient transfers STS with Min A/CGA and CGA for bed mobility    Time 8    Period Weeks    Status New    Target Date 06/30/20      PT LONG TERM GOAL #4   Title Assess progress towards DGI and MCTSIB goals    Baseline not set    Time 8    Period Weeks    Status New    Target Date 06/30/20  Plan - 05/15/20 1206    Clinical Impression Statement less impulsive but requires cuing to stay fully on task, continued supine LE and trunk strengthening, now able to STS transfer with close S, ambulation with SPC and WC follow. still needs close S due to decreased  safety awareness and impulsiveness    Personal Factors and Comorbidities Comorbidity 3+    Comorbidities HTN, HLD, CVA    Examination-Activity Limitations Stand;Stairs    Examination-Participation Restrictions Occupation    Stability/Clinical Decision Making Evolving/Moderate complexity    Rehab Potential Good    PT Frequency 2x / week    PT Duration 8 weeks    PT Treatment/Interventions DME Instruction;Gait training;Stair training;Functional mobility training;Therapeutic activities;Therapeutic exercise;Balance training;Neuromuscular re-education;Cognitive remediation;Patient/family education;Orthotic Fit/Training;Dry needling    PT Next Visit Plan continue LLE and core strengthening, ambulation with quad tip cane, monitor impulsiveness, encourage safe tranfers under S    PT Home Exercise Plan issued HEP as documented elsewhere    Consulted and Agree with Plan of Care Family member/caregiver;Patient           Patient will benefit from skilled therapeutic intervention in order to improve the following deficits and impairments:  Abnormal gait,Decreased balance,Decreased endurance,Decreased mobility,Difficulty walking,Decreased cognition,Decreased knowledge of precautions,Impaired perceived functional ability,Decreased activity tolerance,Decreased coordination,Decreased safety awareness,Decreased strength  Visit Diagnosis: Unsteadiness on feet  Muscle weakness (generalized)     Problem List Patient Active Problem List   Diagnosis Date Noted  . 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 Leukocytosis   . Tobacco abuse   . Prediabetes   . Dyslipidemia   . Tobacco dependence due to cigarettes 04/27/2020    Hildred Laser PT 05/15/2020, 12:17 PM  [facility] [address] [place], [place], [address] Phone: [phone]   Fax:  [phone]  Name: Timothy Baker MRN: [id number] Date of Birth:  Aug 19, 1960

## 2020-05-19 ENCOUNTER — Ambulatory Visit: Payer: BC Managed Care – PPO | Attending: Internal Medicine

## 2020-05-19 ENCOUNTER — Ambulatory Visit: Payer: BC Managed Care – PPO

## 2020-05-19 ENCOUNTER — Ambulatory Visit: Payer: BC Managed Care – PPO | Admitting: Occupational Therapy

## 2020-05-19 ENCOUNTER — Other Ambulatory Visit: Payer: Self-pay

## 2020-05-19 DIAGNOSIS — R471 Dysarthria and anarthria: Secondary | ICD-10-CM | POA: Insufficient documentation

## 2020-05-19 DIAGNOSIS — R278 Other lack of coordination: Secondary | ICD-10-CM | POA: Diagnosis present

## 2020-05-19 DIAGNOSIS — R2681 Unsteadiness on feet: Secondary | ICD-10-CM | POA: Insufficient documentation

## 2020-05-19 DIAGNOSIS — R414 Neurologic neglect syndrome: Secondary | ICD-10-CM | POA: Insufficient documentation

## 2020-05-19 DIAGNOSIS — M6281 Muscle weakness (generalized): Secondary | ICD-10-CM | POA: Diagnosis present

## 2020-05-19 DIAGNOSIS — I69352 Hemiplegia and hemiparesis following cerebral infarction affecting left dominant side: Secondary | ICD-10-CM | POA: Insufficient documentation

## 2020-05-19 DIAGNOSIS — I69318 Other symptoms and signs involving cognitive functions following cerebral infarction: Secondary | ICD-10-CM | POA: Insufficient documentation

## 2020-05-19 DIAGNOSIS — R41841 Cognitive communication deficit: Secondary | ICD-10-CM

## 2020-05-19 NOTE — Patient Instructions (Addendum)
  Coordination Activities  Perform the following activities for 15 minutes 1-2 times per day with left hand(s).   Rotate ball in fingertips (clockwise and counter-clockwise).  Flip cards 1 at a time as fast as you can.  Deal cards with your thumb (Hold deck in hand and push card off top with thumb). Make sure you are over the table fully  Rotate 1 card in hand (clockwise and counter-clockwise).  Pick up coins and place in container or coin bank.  Pick up coins and stack.  Practice writing - try to write as big as possible  Screw together nuts and bolts, then unfasten using Lt hand as dominant hand.  *Try to eat finger foods and foods that won't fall off the utensil (meat, mashed potatoes) with Lt hand and wash face with Lt hand. Could also try to use a electric toothbrush w/ Lt hand  *Do NOT use Lt hand for anything: hot, breakable, sharp or heavy at this time.  *Look at Lt hand when using it!!  1. Grip Strengthening (Resistive Putty)   Squeeze putty using thumb and all fingers. Repeat _20___ times. Do __2__ sessions per day.

## 2020-05-19 NOTE — Therapy (Signed)
[facility] [address] [place], [place], [address] Phone: [phone]   Fax:  [phone]  Speech Language Pathology Treatment  Patient Details  Name: Timothy Baker MRN: [id number] Date of Birth: [birth year] Referring Provider (SLP): Dr. Marvel Plan   Encounter Date: 05/19/2020   End of Session - 05/19/20 1256    Visit Number 4    Number of Visits 17    Date for SLP Re-Evaluation 07/01/20    Authorization Type BCBS    SLP Start Time 1229    SLP Stop Time  1315    SLP Time Calculation (min) 46 min    Activity Tolerance Patient tolerated treatment well           Past Medical History:  Diagnosis Date  . Polysubstance abuse (HCC)    quit in 2019 - used ETOH, crack cocaine    Past Surgical History:  Procedure Laterality Date  . genital surgery    . LOOP RECORDER INSERTION N/A 05/01/2020   Procedure: LOOP RECORDER INSERTION;  Surgeon: Hillis Range, MD;  [facility]: [facility];  Service: Cardiovascular;  Laterality: N/A;    There were no vitals filed for this visit.   Subjective Assessment - 05/19/20 1229    Subjective "it was pretty good"    Currently in Pain? Yes    Pain Score 2     Pain Location Knee    Pain Orientation Left    Pain Descriptors / Indicators Aching    Pain Onset In the past 7 days                 ADULT SLP TREATMENT - 05/19/20 1231      General Information   Behavior/Cognition Alert;Impulsive;Pleasant mood;Requires cueing      Treatment Provided   Treatment provided Cognitive-Linquistic      Cognitive-Linquistic Treatment   Treatment focused on Dysarthria;Cognition;Patient/family/caregiver education    Skilled Treatment Pt completed dysarthria HEP yesterday with pt fixated on errors (i.e., seven salty sailors...). SLP targeted carryover of speech compensations (loud, slow, over-articulate) at word, sentence, and conversational level. Min A required for sentence  levels and Mod A to slow rate of speech and increase loudness in conversation. Pt reports he fell getting out of car over the weekend, when he slipped on wet ground. SLP targeted safety scenarios, in which pt able to verbalize need to take his time and pay attention with mod prompting. Questionable carryover of safety strategies at home.      Assessment / Recommendations / Plan   Plan Continue with current plan of care      Progression Toward Goals   Progression toward goals Progressing toward goals            SLP Education - 05/19/20 1247    Education Details abdominal breathing, compensations    Person(s) Educated Patient    Methods Explanation;Demonstration;Handout    Comprehension Verbalized understanding;Returned demonstration            SLP Short Term Goals - 05/19/20 1256      SLP SHORT TERM GOAL #1   Title Pt will complete HEP for dysarthria with occasional min A    Time 3    Period Weeks    Status On-going      SLP SHORT TERM GOAL #2   Title Pt will average 70dB on sentence responses 14/15x with occasional min A    Time 3    Period Weeks    Status On-going  SLP SHORT TERM GOAL #3   Title Pt will utilize compensations for memory to manage schedule, appointments and medication with occasional min A from family and friends    Time 3    Period Weeks    Status On-going      SLP SHORT TERM GOAL #4   Title Pt will verbalize 3 safety concerns in the home and generate solutions to them (emergent awareness) with occasional min A    Time 3    Period Weeks    Status On-going      SLP SHORT TERM GOAL #5   Title Pt will be 90% intelligible over 5 minute conversation in mildly noisy environment with occasional min A    Time 3    Period Weeks    Status On-going            SLP Long Term Goals - 05/19/20 1300      SLP LONG TERM GOAL #1   Title Pt will complete HEP for dysarthria with mod I    Time 7    Period Weeks    Status On-going      SLP LONG TERM  GOAL #2   Title Pt will average 70dB over 15 minute conversation with rare min A    Time 7    Period Weeks    Status On-going      SLP LONG TERM GOAL #3   Title Pt will verbalize carryover of  anticipatory awareness for 3 safety situations in the home    Time 7    Period Weeks    Status On-going      SLP LONG TERM GOAL #4   Title Pt will be 95% intellgible over 15 minute conversation in noisy environment with rare min A    Time 7    Period Weeks    Status On-going      SLP LONG TERM GOAL #5   Title Score on Communicative Effectivess Survey will increase (family to fill out ) by 4 points.    Baseline 16    Time 7    Period Weeks    Status On-going            Plan - 05/19/20 1317    Clinical Impression Statement Timothy Baker "Timothy Baker" presents with moderate mixed dysarthria c/b imprecise articulation, fast rate of speech, and decreased vocal intenstiy. Pt completed dysarthria HEP as instructed last session. SLP reviewed abdominal breathing for adequate breath support to maximize vocal loudness and clarity. Min A required to implement AB for sentence level. Functional sentences were in low to mid 70 DB range with occasional cued use of AB and slow rate via tracking/tapping each word. Converation today averaged low to mid 60's dB (70dB is WNL), which improved with auditory feedback of voice recordings. SLP recommends skilled ST to maximize intelligibility and cognitive communciation skills for safety, to reduce caregiver burden and return to PLOF.    Speech Therapy Frequency 2x / week    Duration 8 weeks   or 17 visits   Treatment/Interventions Diet toleration management by SLP;Environmental controls;Cueing hierarchy;SLP instruction and feedback;Compensatory strategies;Functional tasks;Cognitive reorganization;Compensatory techniques;Internal/external aids;Multimodal communcation approach;Patient/family education    Potential to Achieve Goals Good    SLP Home Exercise Plan  dysarthria HEP    Consulted and Agree with Plan of Care Patient           Patient will benefit from skilled therapeutic intervention in order to improve the following deficits and impairments:  Dysarthria and anarthria  Cognitive communication deficit    Problem List Patient Active Problem List   Diagnosis Date Noted  . 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 Leukocytosis   . Tobacco abuse   . Prediabetes   . Dyslipidemia   . Tobacco dependence due to cigarettes 04/27/2020    Janann Colonel, MA CCC-SLP 05/19/2020, 1:19 PM  [facility] [address] [place], [place], [address] Phone: [phone]   Fax:  [phone]   Name: Timothy Baker MRN: [id number] Date of Birth: [birth year]

## 2020-05-19 NOTE — Patient Instructions (Addendum)
  Practice speaking like you are talking to someone across the room: -Project your voice -Slow and Loud -Move mouth slowly   Practice belly breathing at home while you are watching tv or hanging out. Remember to breath in your nose, out through your mouth.

## 2020-05-19 NOTE — Therapy (Signed)
[facility] [address] [place], [place], [address] Phone: [phone]   Fax:  [phone]  Physical Therapy Treatment  Patient Details  Name: Timothy Baker MRN: [id number] Date of Birth: 1960-09-13 Referring Provider (PT): Seward Carol MD   Encounter Date: 05/19/2020   PT End of Session - 05/19/20 1815    Visit Number 4    Number of Visits 16    Date for PT Re-Evaluation 06/04/20    Authorization Type BCBS    PT Start Time 4765    PT Stop Time 1230    PT Time Calculation (min) 45 min    Equipment Utilized During Treatment Gait belt    Activity Tolerance Patient tolerated treatment well    Behavior During Therapy Impulsive;WFL for tasks assessed/performed           Past Medical History:  Diagnosis Date  . Polysubstance abuse ([place])    quit in 2019 - used ETOH, crack cocaine    Past Surgical History:  Procedure Laterality Date  . genital surgery    . LOOP RECORDER INSERTION N/A 05/01/2020   Procedure: LOOP RECORDER INSERTION;  Surgeon: Thompson Grayer, MD;  Location: [facility];  Service: Cardiovascular;  Laterality: N/A;    There were no vitals filed for this visit.   Subjective Assessment - 05/19/20 1156    Subjective [place] this past Sat trying to exit car, twisted L knee and has discomfort with WBing, feels better when using cane    Patient is accompained by: Family member   sister Webb Silversmith   Pertinent History 60 yo male with onset of L side weakness sustained a fall climbing up to change smoke detector battery on 2/5, then on 2/6 came to ED with significant changes in his L side facially and in L extremities.  On imaging his notes were that the infarction was in R anterior limb of internal capsule.  Pt has memory changes, coordination changes and impulsive behavior.  PMHx:  substance abuse, HTN, HLD, tobacco use    Limitations Standing;Walking    How long can you sit comfortably? unlimited    How  long can you stand comfortably? <2 minutes with assist    How long can you walk comfortably? <2 minutes with assist    Patient Stated Goals To return to I ambulation and mobility    Currently in Pain? Yes    Pain Score 2     Pain Location Knee    Pain Orientation Left    Pain Descriptors / Indicators Aching    Pain Type Acute pain    Pain Onset In the past 7 days    Pain Frequency Intermittent    Aggravating Factors  WB and twisting,    Pain Relieving Factors rest    Effect of Pain on Daily Activities limits ambulation tolerance and increases fall risk                             [facility] Adult PT Treatment/Exercise - 05/19/20 0001      Knee/Hip Exercises: Aerobic   Other Aerobic Scifit L2 x8'      Knee/Hip Exercises: Seated   Heel Slides Limitations seated 15x2 using towel under shoe    Marching AROM;Strengthening;Left;Limitations    Marching Limitations in sitting, had patient place L foot onto 4" step from each side, medial and lateral then over and back for 15x pertask to simulate entering/exiting car  Ankle Exercises: Standing   Heel Raises Both;15 reps   2x15 with VCs needed to maintain knee extension B   Toe Raise 15 reps   2x15 with VCs to maintain knee extension              Balance Exercises - 05/19/20 0001      Balance Exercises: Standing   Retro Gait 5 reps;Limitations    Retro Gait Limitations performed at counter with RUE support, 5 trips    Sidestepping 5 reps;Limitations    Sidestepping Limitations performed at counter 5 trips with CGA cues for exaggerated hip flexion on L    Other Standing Exercises Comments L abduction 15x at counter with B UE support and TCs at pelvis               PT Short Term Goals - 05/19/20 1821      PT SHORT TERM GOAL #1   Title patient to tranfer STS under S with no physical assist    Baseline Min-Mod A to transfer STS; 05/19/20 able to STS transfer under S only    Time 2    Period Weeks    Status  Achieved    Target Date 05/19/20      PT SHORT TERM GOAL #2   Title Patient to demo established HEP follwing development of written exercises based on patients abilities and deficits    Baseline no HEP; verbally reviewed exercises    Time 2    Period Weeks    Status Partially Met    Target Date 05/19/20      PT SHORT TERM GOAL #3   Title Patient to ambulate 115ft x2 with RW and close S of PT    Baseline 115ft with rollator and Min A of PT; ambulation of 115ft with SPC and CGA    Time 2    Period Weeks    Status Achieved    Target Date 05/19/20      PT SHORT TERM GOAL #4   Title Assess DGI and MCTSIB and establish goals    Baseline not tested    Time 2    Period Days    Status On-going    Target Date 05/19/20             PT Long Term Goals - 05/05/20 1135      PT LONG TERM GOAL #1   Title Ambulation of 500ft with LRAD and S of PT across level ground    Baseline 115ft with RW and MinA of PT across level surface    Time 8    Period Weeks    Status New    Target Date 06/30/20      PT LONG TERM GOAL #2   Title Patient to increase LLE strength to 4/5 grossly throughout    Baseline 3-3+/5 grossly throughout    Time 8    Period Weeks    Target Date 06/30/20      PT LONG TERM GOAL #3   Title Patient to transfer and mobilize from all surfaces with ony distant S    Baseline patient transfers STS with Min A/CGA and CGA for bed mobility    Time 8    Period Weeks    Status New    Target Date 06/30/20      PT LONG TERM GOAL #4   Title Assess progress towards DGI and MCTSIB goals    Baseline not set    Time 8      Period Weeks    Status New    Target Date 06/30/20                 Plan - 05/19/20 1816    Clinical Impression Statement Skilled treatment today centered around gait and balance training accomodating for L knee discomfort following recent fall and subsequent srpain, brief assessment shows mild effusion posteriorly but not enough to limit flexion,  limited unsupported WB today out of caution, focus on L hip tasks of getting in/out of car and stepping across door sill, abduction and sidstepping activities with UE support with TCs for proper pelvic alignment    Personal Factors and Comorbidities Comorbidity 3+    Comorbidities HTN, HLD, CVA    Examination-Activity Limitations Stand;Stairs    Examination-Participation Restrictions Occupation    Stability/Clinical Decision Making Evolving/Moderate complexity    Rehab Potential Good    PT Frequency 2x / week    PT Duration 8 weeks    PT Treatment/Interventions DME Instruction;Gait training;Stair training;Functional mobility training;Therapeutic activities;Therapeutic exercise;Balance training;Neuromuscular re-education;Cognitive remediation;Patient/family education;Orthotic Fit/Training;Dry needling    PT Next Visit Plan f/u on L knee symptoms and continue gait and balance training focused on L hip and knee flexion as DF appears functional    PT Home Exercise Plan issued HEP as documented elsewhere    Consulted and Agree with Plan of Care Family member/caregiver;Patient           Patient will benefit from skilled therapeutic intervention in order to improve the following deficits and impairments:  Abnormal gait,Decreased balance,Decreased endurance,Decreased mobility,Difficulty walking,Decreased cognition,Decreased knowledge of precautions,Impaired perceived functional ability,Decreased activity tolerance,Decreased coordination,Decreased safety awareness,Decreased strength  Visit Diagnosis: Unsteadiness on feet  Muscle weakness (generalized)     Problem List Patient Active Problem List   Diagnosis Date Noted  . 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 Leukocytosis   . Tobacco abuse   . Prediabetes   . Dyslipidemia   . Tobacco dependence due to cigarettes 04/27/2020    Lanice Shirts PT 05/19/2020, 6:25 PM  [facility] [address] [place], [place], [address] Phone: [phone]   Fax:  [phone]  Name: Timothy Baker MRN: [id number] Date of Birth: [birth year]

## 2020-05-19 NOTE — Therapy (Signed)
[facility] [address] [place], [place], [address] Phone: [phone]   Fax:  [phone]  Occupational Therapy Treatment  Patient Details  Name: Timothy Baker MRN: [id number] Date of Birth: 17-Feb-1961 Referring Provider (OT): Dr. Marvel Plan   Encounter Date: 05/19/2020   OT End of Session - 05/19/20 1134    Visit Number 2    Number of Visits 16    Date for OT Re-Evaluation 07/10/20    Authorization Type BC/BS - VL 30 combined PT/OT (Speech has 30)    Authorization - Visit Number 2    Authorization - Number of Visits 15    OT Start Time 1100    OT Stop Time 1145    OT Time Calculation (min) 45 min    Activity Tolerance Patient tolerated treatment well    Behavior During Therapy Impulsive           Past Medical History:  Diagnosis Date  . Polysubstance abuse (HCC)    quit in 2019 - used ETOH, crack cocaine    Past Surgical History:  Procedure Laterality Date  . genital surgery    . LOOP RECORDER INSERTION N/A 05/01/2020   Procedure: LOOP RECORDER INSERTION;  Surgeon: Hillis Range, MD;  [facility];  Service: Cardiovascular;  Laterality: N/A;    There were no vitals filed for this visit.   Subjective Assessment - 05/19/20 1102    Subjective  I slipped in the mud Saturday; I feel like I pulled something    Pertinent History CVA 04/27/20 with Lt hemiplegia. PMH: HTN, HLD, substance abuse    Limitations loop recorder 05/01/20, no driving    Currently in Pain? Yes   Lt leg - O.T. not addressing d/t outside scope of practice             Pt issued coordination activities and putty for grip strength - see pt instructions for details.  Discussed safety considerations and reinforced looking at Lt hand when using it, encouraging forced use of Lt hand for safe tasks, etc. But not to use for anything unsafe.   Pt copying peg design with medium sized pegs using Lt hand w/ cue to correct  error and mod cues to use Lt hand but did improve w/ repetition, less cueing required near end of task.                   OT Education - 05/19/20 1123    Education Details Coordination HEP, Putty HEP, reinforcement to look to Lt side and use Lt hand    Person(s) Educated Patient    Methods Explanation;Demonstration;Verbal cues;Handout    Comprehension Verbalized understanding;Verbal cues required;Returned demonstration            OT Short Term Goals - 05/19/20 1134      OT SHORT TERM GOAL #1   Title Independent with HEP for Lt hand coordination and strength    Time 4    Period Weeks    Status On-going      OT SHORT TERM GOAL #2   Title Independent with LUE shoulder HEP    Time 4    Period Weeks    Status New      OT SHORT TERM GOAL #3   Title Pt to use Lt hand to feed self and groom 50% of the time    Baseline using Rt non dominant hand    Time 4    Period Weeks  Status New      OT SHORT TERM GOAL #4   Title Pt to make snack and perform light IADLS (washing dishes, folding towels) from standing position safely    Time 4    Period Weeks    Status New      OT SHORT TERM GOAL #5   Title Pt to perform LE dressing at mod I level w/ A/E prn    Time 4    Period Weeks    Status New      OT SHORT TERM GOAL #6   Title Pt to perform walker negotiation and environmental scanning through tight spaces w/ 85% accuracy and no bumps on Lt side    Time 4    Period Weeks    Status New             OT Long Term Goals - 05/12/20 1531      OT LONG TERM GOAL #1   Title Pt to improve coordination Lt hand as evidenced by performing 9 hole peg test in under 40 sec    Baseline 49.44 sec    Time 8    Period Weeks    Status New      OT LONG TERM GOAL #2   Title Pt to improve grip strength Lt hand to 80 lbs or greater    Baseline 72 lbs (Rt = 81)    Time 8    Period Weeks    Status New      OT LONG TERM GOAL #3   Title Pt to return to simple cooking tasks  and IADLS safely w/ DME/AE prn in prep for return to living alone    Time 8    Period Weeks    Status New      OT LONG TERM GOAL #4   Title Independent with memory compensatory strategies for medication management and paying bills    Time 8    Period Weeks    Status New      OT LONG TERM GOAL #5   Title Pt to use Lt dominant hand 90% for eating and grooming tasks    Time 8    Period Weeks    Status New                 Plan - 05/19/20 1135    Clinical Impression Statement Pt requires cueing throughout session to use Lt hand and look to Lt side/attend to Lt side. Pt easily distracted in busy gym and needs redirection    OT Occupational Profile and History Detailed Assessment- Review of Records and additional review of physical, cognitive, psychosocial history related to current functional performance    Occupational performance deficits (Please refer to evaluation for details): ADL's;IADL's;Work    Body Structure / Function / Physical Skills ADL;Strength;Dexterity;Balance;Body mechanics;Proprioception;UE functional use;IADL;ROM;Endurance;Coordination;Mobility;FMC;Decreased knowledge of precautions    Cognitive Skills Attention;Safety Awareness;Memory;Perception    Rehab Potential Good    Clinical Decision Making Several treatment options, min-mod task modification necessary    Comorbidities Affecting Occupational Performance: May have comorbidities impacting occupational performance    Modification or Assistance to Complete Evaluation  Min-Moderate modification of tasks or assist with assess necessary to complete eval    OT Frequency 2x / week    OT Duration 8 weeks    OT Treatment/Interventions Self-care/ADL training;DME and/or AE instruction;Therapeutic activities;Aquatic Therapy;Therapeutic exercise;Cognitive remediation/compensation;Coping strategies training;Neuromuscular education;Functional Mobility Training;Passive range of motion;Visual/perceptual  remediation/compensation;Patient/family education;Manual Therapy;Moist Heat    Plan review of HEP  prn, continue to reinforce use of LUE for safe tasks, visual scanning - try environmental scanning and walker negotiation w/ walker if safe/able    Consulted and Agree with Plan of Care Patient           Patient will benefit from skilled therapeutic intervention in order to improve the following deficits and impairments:   Body Structure / Function / Physical Skills: ADL,Strength,Dexterity,Balance,Body mechanics,Proprioception,UE functional use,IADL,ROM,Endurance,Coordination,Mobility,FMC,Decreased knowledge of precautions Cognitive Skills: Attention,Safety Awareness,Memory,Perception     Visit Diagnosis: Hemiplegia and hemiparesis following cerebral infarction affecting left dominant side (HCC)  Other lack of coordination  Neurologic neglect syndrome  Muscle weakness (generalized)    Problem List Patient Active Problem List   Diagnosis Date Noted  . 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 Leukocytosis   . Tobacco abuse   . Prediabetes   . Dyslipidemia   . Tobacco dependence due to cigarettes 04/27/2020    Kelli Churn, OTR/L 05/19/2020, 11:39 AM  [facility] [address] [place], [place], [address] Phone: [phone]   Fax:  [phone]  Name: LAVERE STORK MRN: [id number] Date of Birth: 1961-01-19

## 2020-05-22 ENCOUNTER — Other Ambulatory Visit: Payer: Self-pay

## 2020-05-22 ENCOUNTER — Ambulatory Visit: Payer: BC Managed Care – PPO

## 2020-05-22 DIAGNOSIS — I69352 Hemiplegia and hemiparesis following cerebral infarction affecting left dominant side: Secondary | ICD-10-CM

## 2020-05-22 DIAGNOSIS — R2681 Unsteadiness on feet: Secondary | ICD-10-CM

## 2020-05-22 NOTE — Therapy (Signed)
[facility] [address] [place], [place], [address] Phone: [phone]   Fax:  [phone]  Physical Therapy Treatment  Patient Details  Name: Timothy Baker MRN: [id number] Date of Birth: 07/01/1960 Referring Provider (PT): Seward Carol MD   Encounter Date: 05/22/2020   PT End of Session - 05/22/20 1217    Visit Number 5    Number of Visits 16    Date for PT Re-Evaluation 06/04/20    Authorization Type BCBS    PT Start Time 1100    PT Stop Time 1145    PT Time Calculation (min) 45 min    Equipment Utilized During Treatment Gait belt    Activity Tolerance Patient tolerated treatment well    Behavior During Therapy Impulsive;WFL for tasks assessed/performed           Past Medical History:  Diagnosis Date  . Polysubstance abuse ([place])    quit in 2019 - used ETOH, crack cocaine    Past Surgical History:  Procedure Laterality Date  . genital surgery    . LOOP RECORDER INSERTION N/A 05/01/2020   Procedure: LOOP RECORDER INSERTION;  Surgeon: Thompson Grayer, MD;  Location: [facility];  Service: Cardiovascular;  Laterality: N/A;    There were no vitals filed for this visit.   Subjective Assessment - 05/22/20 1112    Subjective Has DCed WC and advanced to cane on his own, L knee pain rated at 1/10, has been walking more with cane but wants to ambulate alone    Patient is accompained by: Family member   sister Webb Silversmith   Pertinent History 60 yo male with onset of L side weakness sustained a fall climbing up to change smoke detector battery on 2/5, then on 2/6 came to ED with significant changes in his L side facially and in L extremities.  On imaging his notes were that the infarction was in R anterior limb of internal capsule.  Pt has memory changes, coordination changes and impulsive behavior.  PMHx:  substance abuse, HTN, HLD, tobacco use    Limitations Standing;Walking    How long can you sit comfortably?  unlimited    How long can you stand comfortably? 5-10'    How long can you walk comfortably? 5-10'    Patient Stated Goals To return to I ambulation and mobility    Pain Score 1     Pain Location Knee    Pain Descriptors / Indicators Sore    Pain Type Acute pain    Aggravating Factors  none    Pain Relieving Factors n/a    Effect of Pain on Daily Activities n/a                             [facility] Adult PT Treatment/Exercise - 05/22/20 0001      Transfers   Transfers Sit to Stand    Sit to Stand 5: Supervision;4: Min guard    Sit to Stand Details Tactile cues for weight shifting;Verbal cues for technique    Sit to Stand Details (indicate cue type and reason) had patient hold 5.5# ball close to chestfor task to challenge balance and strength      Knee/Hip Exercises: Aerobic   Nustep 8' L2 arms 10               Balance Exercises - 05/22/20 0001      Balance Exercises: Standing   SLS  with Vectors Solid surface;Upper extremity assist 1;1 rep;30 secs;Limitations    SLS with Vectors Limitations in // bars had patient tap soccer ball 15x per LE, then advanced to SLS 30s manipulating soccerball with opposite LE    Stepping Strategy Lateral;10 reps;Limitations    Stepping Strategy Limitations lateral step overs in // bars UE support, 4"step over and back 10x wih VCs to leave room for foot placement    Rockerboard Anterior/posterior;Lateral;EO;30 seconds;Intermittent UE support;Limitations    Rockerboard Limitations performed static RB callenges in A/P and M/L directions, 30s x1, then advanced to tandem standing 0sx1 ea. position    Step Over Hurdles / Cones ambulation 101f x4 stepping over low profile hurdles leading with LLE to facilitate L knee/hip flexion               PT Short Term Goals - 05/19/20 1821      PT SHORT TERM GOAL #1   Title patient to tranfer STS under S with no physical assist    Baseline Min-Mod A to transfer STS; 05/19/20 able to STS  transfer under S only    Time 2    Period Weeks    Status Achieved    Target Date 05/19/20      PT SHORT TERM GOAL #2   Title Patient to demo established HEP follwing development of written exercises based on patients abilities and deficits    Baseline no HEP; verbally reviewed exercises    Time 2    Period Weeks    Status Partially Met    Target Date 05/19/20      PT SHORT TERM GOAL #3   Title Patient to ambulate 1157fx2 with RW and close S of PT    Baseline 11569fith rollator and Min A of PT; ambulation of 115f41fth SPC and CGA    Time 2    Period Weeks    Status Achieved    Target Date 05/19/20      PT SHORT TERM GOAL #4   Title Assess DGI and MCTSIB and establish goals    Baseline not tested    Time 2    Period Days    Status On-going    Target Date 05/19/20             PT Long Term Goals - 05/05/20 1135      PT LONG TERM GOAL #1   Title Ambulation of 500ft18fh LRAD and S of PT across level ground    Baseline 115ft 44f RW and MinA of PT across level surface    Time 8    Period Weeks    Status New    Target Date 06/30/20      PT LONG TERM GOAL #2   Title Patient to increase LLE strength to 4/5 grossly throughout    Baseline 3-3+/5 grossly throughout    Time 8    Period Weeks    Target Date 06/30/20      PT LONG TERM GOAL #3   Title Patient to transfer and mobilize from all surfaces with ony distant S    Baseline patient transfers STS with Min A/CGA and CGA for bed mobility    Time 8    Period Weeks    Status New    Target Date 06/30/20      PT LONG TERM GOAL #4   Title Assess progress towards DGI and MCTSIB goals    Baseline not set    Time 8  Period Weeks    Status New    Target Date 06/30/20                 Plan - 05/22/20 1220    Clinical Impression Statement Skilled session today for LLE strengthening, balance and gait challenges focused on L hip/knee extension, balance on non-compliant surfaces and review of safetyawareness  due to mild poor safety awareness, emphasized need to not walk alone but OK to use cane in home    Personal Factors and Comorbidities Comorbidity 3+    Comorbidities HTN, HLD, CVA    Examination-Activity Limitations Stand;Stairs    Examination-Participation Restrictions Occupation    Stability/Clinical Decision Making Evolving/Moderate complexity    Rehab Potential Good    PT Frequency 2x / week    PT Duration 8 weeks    PT Treatment/Interventions DME Instruction;Gait training;Stair training;Functional mobility training;Therapeutic activities;Therapeutic exercise;Balance training;Neuromuscular re-education;Cognitive remediation;Patient/family education;Orthotic Fit/Training;Dry needling    PT Next Visit Plan f/u on L knee symptoms and continue gait and balance training focused on L hip and knee flexion as DF appears functional    PT Home Exercise Plan issued HEP as documented elsewhere    Consulted and Agree with Plan of Care Family member/caregiver;Patient           Patient will benefit from skilled therapeutic intervention in order to improve the following deficits and impairments:  Abnormal gait,Decreased balance,Decreased endurance,Decreased mobility,Difficulty walking,Decreased cognition,Decreased knowledge of precautions,Impaired perceived functional ability,Decreased activity tolerance,Decreased coordination,Decreased safety awareness,Decreased strength  Visit Diagnosis: Unsteadiness on feet  Hemiplegia and hemiparesis following cerebral infarction affecting left dominant side [facility])     Problem List Patient Active Problem List   Diagnosis Date Noted  . 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 Leukocytosis   . Tobacco abuse   . Prediabetes   . Dyslipidemia   . Tobacco dependence due to cigarettes 04/27/2020    Lanice Shirts 05/22/2020, 12:34 PM  [facility] [address] [place], [place],  [address] Phone: [phone]   Fax:  [phone]  Name: DIERKS WACH MRN: [id number] Date of Birth: 11-12-1960

## 2020-05-25 ENCOUNTER — Other Ambulatory Visit: Payer: Self-pay

## 2020-05-25 ENCOUNTER — Ambulatory Visit: Payer: BC Managed Care – PPO

## 2020-05-25 DIAGNOSIS — R2681 Unsteadiness on feet: Secondary | ICD-10-CM

## 2020-05-25 DIAGNOSIS — I69352 Hemiplegia and hemiparesis following cerebral infarction affecting left dominant side: Secondary | ICD-10-CM

## 2020-05-25 DIAGNOSIS — R471 Dysarthria and anarthria: Secondary | ICD-10-CM

## 2020-05-25 DIAGNOSIS — R41841 Cognitive communication deficit: Secondary | ICD-10-CM

## 2020-05-25 NOTE — Therapy (Signed)
[facility] [address] [place], [place], [address] Phone: [phone]   Fax:  [phone]  Speech Language Pathology Treatment  Patient Details  Name: Timothy Baker MRN: [id number] Date of Birth: 07-Nov-1960 Referring Provider (SLP): Dr. Marvel Plan   Encounter Date: 05/25/2020   End of Session - 05/25/20 1220    Visit Number 5    Number of Visits 17    Date for SLP Re-Evaluation 07/01/20    Authorization Type BCBS    SLP Start Time 1145    SLP Stop Time  1230    SLP Time Calculation (min) 45 min    Activity Tolerance Patient tolerated treatment well           Past Medical History:  Diagnosis Date  . Polysubstance abuse (HCC)    quit in 2019 - used ETOH, crack cocaine    Past Surgical History:  Procedure Laterality Date  . genital surgery    . LOOP RECORDER INSERTION N/A 05/01/2020   Procedure: LOOP RECORDER INSERTION;  Surgeon: Hillis Range, MD;  [facility];  Service: Cardiovascular;  Laterality: N/A;    There were no vitals filed for this visit.   Subjective Assessment - 05/25/20 1141    Subjective "Im getting around good with that stick" re: walking with cane   Currently in Pain? No/denies                 ADULT SLP TREATMENT - 05/25/20 1147      General Information   Behavior/Cognition Alert;Impulsive;Pleasant mood;Requires cueing      Treatment Provided   Treatment provided Cognitive-Linquistic      Cognitive-Linquistic Treatment   Treatment focused on Dysarthria;Cognition;Patient/family/caregiver education    Skilled Treatment Pt reported he had not completed HEP as he forget where he placed it. SLP provided repeat instructions. Loud /a/ completed to recalibrate appropriate loudness, with pt noted to strain voice for increased loudness. Mod A fading to min A required to utilize AB and improve flow of speech versus straining. Pt read conversational phrases with  average of low 70s db. Mod A required in conversation to reduce rate and increase volume. SLP targeted conversational starters for short conversation, with additional processing time required to formulate ideas. Pt noted to be in mid 60s db range, which minimally improved with SLP modeling and frequent non-verbal cues. SLP trialed written visual cues and recording voice for feedback, which did intermittently improve rate and volume.      Assessment / Recommendations / Plan   Plan Continue with current plan of care      Progression Toward Goals   Progression toward goals Progressing toward goals              SLP Short Term Goals - 05/25/20 1140      SLP SHORT TERM GOAL #1   Title Pt will complete HEP for dysarthria with occasional min A    Time 2    Period Weeks    Status On-going      SLP SHORT TERM GOAL #2   Title Pt will average 70dB on sentence responses 14/15x with occasional min A    Time 2    Period Weeks    Status On-going      SLP SHORT TERM GOAL #3   Title Pt will utilize compensations for memory to manage schedule, appointments and medication with occasional min A from family and friends    Time 2  Period Weeks    Status On-going      SLP SHORT TERM GOAL #4   Title Pt will verbalize 3 safety concerns in the home and generate solutions to them (emergent awareness) with occasional min A    Time 2    Period Weeks    Status On-going      SLP SHORT TERM GOAL #5   Title Pt will be 90% intelligible over 5 minute conversation in mildly noisy environment with occasional min A    Time 2    Period Weeks    Status On-going            SLP Long Term Goals - 05/25/20 1141      SLP LONG TERM GOAL #1   Title Pt will complete HEP for dysarthria with mod I    Time 6    Period Weeks    Status On-going      SLP LONG TERM GOAL #2   Title Pt will average 70dB over 15 minute conversation with rare min A    Time 6    Period Weeks    Status On-going      SLP LONG  TERM GOAL #3   Title Pt will verbalize carryover of  anticipatory awareness for 3 safety situations in the home    Time 6    Period Weeks    Status On-going      SLP LONG TERM GOAL #4   Title Pt will be 95% intellgible over 15 minute conversation in noisy environment with rare min A    Time 6    Period Weeks    Status On-going      SLP LONG TERM GOAL #5   Title Score on Communicative Effectivess Survey will increase (family to fill out ) by 4 points.    Baseline 16    Time 6    Period Weeks    Status On-going            Plan - 05/25/20 1228    Clinical Impression Statement [name] "Timothy Baker" presents with moderate mixed dysarthria c/b imprecise articulation, fast rate of speech, and decreased vocal intenstiy. SLP reviewed abdominal breathing for adequate breath support to maximize vocal loudness and clarity. Min A required to implement AB for loud /a/. Functional sentences were in low to mid 70 DB range with improved volume and rate. Conversation today averaged low to mid 60's dB (70dB is WNL), which improved with auditory feedback of voice recordings and written visual cues. SLP recommends skilled ST to maximize intelligibility and cognitive communciation skills for safety, to reduce caregiver burden and return to PLOF.    Speech Therapy Frequency 2x / week    Duration 8 weeks    Treatment/Interventions Diet toleration management by SLP;Environmental controls;Cueing hierarchy;SLP instruction and feedback;Compensatory strategies;Functional tasks;Cognitive reorganization;Compensatory techniques;Internal/external aids;Multimodal communcation approach;Patient/family education    Potential to Achieve Goals Good    SLP Home Exercise Plan dysarthria HEP           Patient will benefit from skilled therapeutic intervention in order to improve the following deficits and impairments:   Dysarthria and anarthria  Cognitive communication deficit    Problem List Patient Active  Problem List   Diagnosis Date Noted  . 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 Leukocytosis   . Tobacco abuse   . Prediabetes   . Dyslipidemia   . Tobacco dependence due to cigarettes 04/27/2020    Janann Colonel, MA CCC-SLP 05/25/2020,  12:30 PM  [facility] [address] [place], [place], [address] Phone: [phone]   Fax:  [phone]   Name: Timothy Baker MRN: [id number] Date of Birth: 10-Aug-1960

## 2020-05-25 NOTE — Therapy (Signed)
[facility] [address] [place], [place], [address] Phone: [phone]   Fax:  [phone]  Physical Therapy Treatment  Patient Details  Name: Timothy Baker MRN: [id number] Date of Birth: [birth year] Referring Provider (PT): Seward Carol MD   Encounter Date: 05/25/2020   PT End of Session - 05/25/20 1157    Visit Number 6    Number of Visits 16    Date for PT Re-Evaluation 06/04/20    Authorization Type BCBS    PT Start Time 1100    PT Stop Time 1145    PT Time Calculation (min) 45 min    Equipment Utilized During Treatment Gait belt    Activity Tolerance Patient tolerated treatment well    Behavior During Therapy Impulsive;WFL for tasks assessed/performed           Past Medical History:  Diagnosis Date  . Polysubstance abuse ([facility])    quit in 2019 - used ETOH, crack cocaine    Past Surgical History:  Procedure Laterality Date  . genital surgery    . LOOP RECORDER INSERTION N/A 05/01/2020   Procedure: LOOP RECORDER INSERTION;  Surgeon: Thompson Grayer, MD;  Location: [facility];  Service: Cardiovascular;  Laterality: N/A;    There were no vitals filed for this visit.   Subjective Assessment - 05/25/20 1106    Subjective Has been using cane for all ambulation tasks, reports he walked for about an hour over the weekend at his job site    Patient is accompained by: Family member   [name]   Pertinent History 60 yo male with onset of L side weakness sustained a fall climbing up to change smoke detector battery on 2/5, then on 2/6 came to ED with significant changes in his L side facially and in L extremities.  On imaging his notes were that the infarction was in R anterior limb of internal capsule.  Pt has memory changes, coordination changes and impulsive behavior.  PMHx:  substance abuse, HTN, HLD, tobacco use    Limitations Standing;Walking    How long can you sit comfortably? unlimited    How  long can you stand comfortably? 5-10'    How long can you walk comfortably? 5-10'    Patient Stated Goals To return to I ambulation and mobility    Currently in Pain? No/denies                             [facility] Adult PT Treatment/Exercise - 05/25/20 0001      Transfers   Transfers Sit to Stand    Sit to Stand 5: Supervision;4: Min guard    Sit to Stand Details Tactile cues for weight shifting    Sit to Stand Details (indicate cue type and reason) performed from airex 1x15      Knee/Hip Exercises: Aerobic   Nustep 8" at L3 arms 10      Knee/Hip Exercises: Standing   Terminal Knee Extension Strengthening;Left;2 sets;15 reps;Theraband;Limitations    Theraband Level (Terminal Knee Extension) Level 2 (Red)    Terminal Knee Extension Limitations standing with cane      Knee/Hip Exercises: Seated   Marching Strengthening;Left;2 sets;15 reps;Limitations    Marching Limitations 2x15 against red band at dorsum of foot to facilitate DF as well               Balance Exercises - 05/25/20 0001  Balance Exercises: Standing   Stepping Strategy Anterior;Lateral;UE support;Limitations    Stepping Strategy Limitations step taps onto 6" step, 15x ea. direction    Rockerboard Anterior/posterior;Lateral;EO;30 seconds;Other reps (comment);Limitations    Rockerboard Limitations static stance EO 30s x2 in A/P and M/L positions followed by tandem stance in A/P and M/L in ea. stance position    Other Standing Exercises heel taps in // bars 2x15 onto floor target, RUE only               PT Short Term Goals - 05/19/20 1821      PT SHORT TERM GOAL #1   Title patient to tranfer STS under S with no physical assist    Baseline Min-Mod A to transfer STS; 05/19/20 able to STS transfer under S only    Time 2    Period Weeks    Status Achieved    Target Date 05/19/20      PT SHORT TERM GOAL #2   Title Patient to demo established HEP follwing development of written  exercises based on patients abilities and deficits    Baseline no HEP; verbally reviewed exercises    Time 2    Period Weeks    Status Partially Met    Target Date 05/19/20      PT SHORT TERM GOAL #3   Title Patient to ambulate 158ft x2 with RW and close S of PT    Baseline 166ft with rollator and Min A of PT; ambulation of 165ft with SPC and CGA    Time 2    Period Weeks    Status Achieved    Target Date 05/19/20      PT SHORT TERM GOAL #4   Title Assess DGI and MCTSIB and establish goals    Baseline not tested    Time 2    Period Days    Status On-going    Target Date 05/19/20             PT Long Term Goals - 05/05/20 1135      PT LONG TERM GOAL #1   Title Ambulation of 529ft with LRAD and S of PT across level ground    Baseline 16ft with RW and MinA of PT across level surface    Time 8    Period Weeks    Status New    Target Date 06/30/20      PT LONG TERM GOAL #2   Title Patient to increase LLE strength to 4/5 grossly throughout    Baseline 3-3+/5 grossly throughout    Time 8    Period Weeks    Target Date 06/30/20      PT LONG TERM GOAL #3   Title Patient to transfer and mobilize from all surfaces with ony distant S    Baseline patient transfers STS with Min A/CGA and CGA for bed mobility    Time 8    Period Weeks    Status New    Target Date 06/30/20      PT LONG TERM GOAL #4   Title Assess progress towards DGI and MCTSIB goals    Baseline not set    Time 8    Period Weeks    Status New    Target Date 06/30/20                 Plan - 05/25/20 1157    Clinical Impression Statement Focus of todays skilled session revolved around LLE hip and knee flexion  to facilitate swing phase of gait, performed marching tasks in seated, heel taps in standing, rocker board actvities for WS in A/P, M/L and tandem positions, continues to show a flexed L knee posture with standing and walking positions. however, maintains good balance despite altered mechanics     Personal Factors and Comorbidities Comorbidity 3+    Comorbidities HTN, HLD, CVA    Examination-Activity Limitations Stand;Stairs    Examination-Participation Restrictions Occupation    Stability/Clinical Decision Making Evolving/Moderate complexity    Rehab Potential Good    PT Frequency 2x / week    PT Duration 8 weeks    PT Treatment/Interventions DME Instruction;Gait training;Stair training;Functional mobility training;Therapeutic activities;Therapeutic exercise;Balance training;Neuromuscular re-education;Cognitive remediation;Patient/family education;Orthotic Fit/Training;Dry needling    PT Next Visit Plan assess FGA/DGI, check STGs, cotinue to emphasize LLE hip and knee flexion to improve swing through gait and reduce all risk    PT Home Exercise Plan issued HEP as documented elsewhere    Consulted and Agree with Plan of Care Family member/caregiver;Patient           Patient will benefit from skilled therapeutic intervention in order to improve the following deficits and impairments:  Abnormal gait,Decreased balance,Decreased endurance,Decreased mobility,Difficulty walking,Decreased cognition,Decreased knowledge of precautions,Impaired perceived functional ability,Decreased activity tolerance,Decreased coordination,Decreased safety awareness,Decreased strength  Visit Diagnosis: Unsteadiness on feet  Hemiplegia and hemiparesis following cerebral infarction affecting left dominant side [facility])     Problem List Patient Active Problem List   Diagnosis Date Noted  . 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 Leukocytosis   . Tobacco abuse   . Prediabetes   . Dyslipidemia   . Tobacco dependence due to cigarettes 04/27/2020    Lanice Shirts PT 05/25/2020, 12:18 PM  [facility] [address] [place], [place], [address] Phone: [phone]   Fax:  [phone]  Name: Timothy Baker MRN:  [id number] Date of Birth: 1960/04/21

## 2020-05-25 NOTE — Patient Instructions (Addendum)
  Practice belly breathing at home while you are watching tv or hanging out. Remember to breath in your nose, out through your mouth.    Practice LOUD and SLOW speech  1. What are you cooking? 2. Hey! How about you put a few ice cubes in my lemonade? 3. I need to go to the restroom. 4. I'm tired. I'm going to lay down.  5. What time is my appointment tomorrow? 6. Which one of yall am I going to ride with? 7. I think I'll sleep good tonight.  8. Who is in the bathroom? 9. Did I get any mail? 10. How about you bring me some ice cream? 11. I need to work on my exercises.    Speech Exercises  Say 3 times, 2 times a day  Call the cat "Buttercup" A calendar of [place] Four floors to cover Yellow oil ointment Fellow lovers of felines Catastrophe in [place] Plump plumbers' plums The church's chimes chimed Telling time 'til eleven Five valve levers Keep the gate closed Go see that guy Fat cows give milk [employer] Gophers Fat frogs flip freely [employer] into bed Get that game to Greg Thick thistles stick together Cinnamon aluminum linoleum Black bugs blood Lovely lemon linament Red leather, yellow leather  Big grocery buggy    Purple baby carriage [facility] Proper copper coffee pot Ripe purple cabbage Three free throws [employer] tackled  [employer] dipped the dessert  [employer] Buckle that [employer] Shirts shrink, shells shouldn't [place] [employer] Take the tackle box File the flash message Give me five flapjacks Fundamental relatives Dye the pets purple Talking [place] time after time Dark chocolate chunks Political landscape of the kingdom Actuary genius We played yo-yos yesterday

## 2020-05-28 ENCOUNTER — Ambulatory Visit: Payer: BC Managed Care – PPO

## 2020-05-28 ENCOUNTER — Other Ambulatory Visit: Payer: Self-pay

## 2020-05-28 DIAGNOSIS — R471 Dysarthria and anarthria: Secondary | ICD-10-CM

## 2020-05-28 DIAGNOSIS — R2681 Unsteadiness on feet: Secondary | ICD-10-CM | POA: Diagnosis not present

## 2020-05-28 DIAGNOSIS — R41841 Cognitive communication deficit: Secondary | ICD-10-CM

## 2020-05-28 NOTE — Therapy (Signed)
[facility] [address] [place], [place], [address] Phone: [phone]   Fax:  [phone]  Speech Language Pathology Treatment  Patient Details  Name: Timothy Baker MRN: [id number] Date of Birth: 07/15/1960 Referring Provider (SLP): Dr. Marvel Plan   Encounter Date: 05/28/2020   End of Session - 05/28/20 1324    Visit Number 6    Number of Visits 17    Date for SLP Re-Evaluation 07/01/20    Authorization Type BCBS    SLP Start Time 1105    SLP Stop Time  1145    SLP Time Calculation (min) 40 min    Activity Tolerance Patient tolerated treatment well           Past Medical History:  Diagnosis Date  . Polysubstance abuse ([facility])    quit in 2019 - used ETOH, crack cocaine    Past Surgical History:  Procedure Laterality Date  . genital surgery    . LOOP RECORDER INSERTION N/A 05/01/2020   Procedure: LOOP RECORDER INSERTION;  Surgeon: Hillis Range, MD;  [facility];  Service: Cardiovascular;  Laterality: N/A;    There were no vitals filed for this visit.   Subjective Assessment - 05/28/20 1107    Subjective "just been hanging out"    Currently in Pain? No/denies                 ADULT SLP TREATMENT - 05/28/20 1110      General Information   Behavior/Cognition Alert;Impulsive;Pleasant mood;Requires cueing      Treatment Provided   Treatment provided Cognitive-Linquistic      Cognitive-Linquistic Treatment   Treatment focused on Dysarthria;Cognition;Patient/family/caregiver education    Skilled Treatment Pt reports he completed dysarthria HEP at home. Loud /a/ completed with mod A to reduce strain in voice. Functional sentences read aloud with pt able to self-correct volume and errors x2. Pt stated "if I can't understand it, I know you can't understand." Slow improvements noted for emergent and anticipatory awareness for speech and cognition. SLP engaged patient in conversation  re: safety scenarios at home, in which pt verbalized desire to return to yard work. Pt verbalized safety concerns x1 re: yard maintenance and current limitations. Min to mod prompting required to ID possible reasons why his mother won't let him manage his own medications.      Assessment / Recommendations / Plan   Plan Continue with current plan of care      Progression Toward Goals   Progression toward goals Progressing toward goals            SLP Education - 05/28/20 1324    Education Details speech practice for convo and reading aloud, safety awareness    Person(s) Educated Patient    Methods Explanation;Demonstration;Handout    Comprehension Verbalized understanding;Returned demonstration;Need further instruction            SLP Short Term Goals - 05/28/20 1120      SLP SHORT TERM GOAL #1   Title Pt will complete HEP for dysarthria with occasional min A    Baseline 05-28-20    Time 2    Period Weeks    Status On-going      SLP SHORT TERM GOAL #2   Title Pt will average 70dB on sentence responses 14/15x with occasional min A    Baseline 05-28-20    Time 2    Period Weeks    Status On-going      SLP  SHORT TERM GOAL #3   Title Pt will utilize compensations for memory to manage schedule, appointments and medication with occasional min A from family and friends    Time 2    Period Weeks    Status On-going      SLP SHORT TERM GOAL #4   Title Pt will verbalize 3 safety concerns in the home and generate solutions to them (emergent awareness) with occasional min A    Baseline 05-28-20    Time 2    Period Weeks    Status On-going      SLP SHORT TERM GOAL #5   Title Pt will be 90% intelligible over 5 minute conversation in mildly noisy environment with occasional min A    Time 2    Period Weeks    Status On-going            SLP Long Term Goals - 05/28/20 1128      SLP LONG TERM GOAL #1   Title Pt will complete HEP for dysarthria with mod I    Time 6    Period  Weeks    Status On-going      SLP LONG TERM GOAL #2   Title Pt will average 70dB over 15 minute conversation with rare min A    Time 6    Period Weeks    Status On-going      SLP LONG TERM GOAL #3   Title Pt will verbalize carryover of  anticipatory awareness for 3 safety situations in the home    Time 6    Period Weeks    Status On-going      SLP LONG TERM GOAL #4   Title Pt will be 95% intellgible over 15 minute conversation in noisy environment with rare min A    Time 6    Period Weeks    Status On-going      SLP LONG TERM GOAL #5   Title Score on Communicative Effectivess Survey will increase (family to fill out ) by 4 points.    Baseline 16    Time 6    Period Weeks    Status On-going            Plan - 05/28/20 1324    Clinical Impression Statement Mr Ellwood Steidle "[name]" presents with moderate mixed dysarthria c/b imprecise articulation, fast rate of speech, and decreased vocal intensity. Mod A required to implement AB for loud /a/ to reduce vocal strain. For sentence level, pt able to self-correct errors impacting intelligibility. Conversation today averaged low to mid 60's dB (70dB is WNL), which required usual min A to carryover compensations for adequate volume and rate. Mild improvements in awareness noted for speech and congition. SLP assessed safety scenarios for home environment, in which pt able to verbally ID safety concerns x1 related to yard work. Mod A required to hypothesize why mother is manging medications instead of doing it himself.  SLP recommends skilled ST to maximize intelligibility and cognitive communciation skills for safety, to reduce caregiver burden and return to PLOF.    Speech Therapy Frequency 2x / week    Duration 8 weeks   or 17 visits   Treatment/Interventions Diet toleration management by SLP;Environmental controls;Cueing hierarchy;SLP instruction and feedback;Compensatory strategies;Functional tasks;Cognitive  reorganization;Compensatory techniques;Internal/external aids;Multimodal communcation approach;Patient/family education    Potential to Achieve Goals Good    Potential Considerations Cooperation/participation level;Family/community support    SLP Home Exercise Plan dysarthria HEP    Consulted and Agree with  Plan of Care Patient           Patient will benefit from skilled therapeutic intervention in order to improve the following deficits and impairments:   Dysarthria and anarthria  Cognitive communication deficit    Problem List Patient Active Problem List   Diagnosis Date Noted  . 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 Leukocytosis   . Tobacco abuse   . Prediabetes   . Dyslipidemia   . Tobacco dependence due to cigarettes 04/27/2020    Janann Colonel, MA CCC-SLP 05/28/2020, 1:34 PM  [facility] [address] [place], [place], [address] Phone: [phone]   Fax:  [phone]   Name: DEIONTAE RABEL MRN: [id number] Date of Birth: [birth year]

## 2020-06-02 ENCOUNTER — Other Ambulatory Visit: Payer: Self-pay

## 2020-06-02 ENCOUNTER — Ambulatory Visit: Payer: BC Managed Care – PPO | Admitting: Occupational Therapy

## 2020-06-02 ENCOUNTER — Ambulatory Visit: Payer: BC Managed Care – PPO | Admitting: Adult Health

## 2020-06-02 ENCOUNTER — Encounter: Payer: Self-pay | Admitting: Adult Health

## 2020-06-02 ENCOUNTER — Ambulatory Visit: Payer: BC Managed Care – PPO

## 2020-06-02 VITALS — BP 118/73 | HR 73 | Ht 75.0 in | Wt 193.0 lb

## 2020-06-02 DIAGNOSIS — R41841 Cognitive communication deficit: Secondary | ICD-10-CM

## 2020-06-02 DIAGNOSIS — I69319 Unspecified symptoms and signs involving cognitive functions following cerebral infarction: Secondary | ICD-10-CM | POA: Diagnosis not present

## 2020-06-02 DIAGNOSIS — R278 Other lack of coordination: Secondary | ICD-10-CM

## 2020-06-02 DIAGNOSIS — I69318 Other symptoms and signs involving cognitive functions following cerebral infarction: Secondary | ICD-10-CM

## 2020-06-02 DIAGNOSIS — G8194 Hemiplegia, unspecified affecting left nondominant side: Secondary | ICD-10-CM

## 2020-06-02 DIAGNOSIS — R471 Dysarthria and anarthria: Secondary | ICD-10-CM

## 2020-06-02 DIAGNOSIS — I69352 Hemiplegia and hemiparesis following cerebral infarction affecting left dominant side: Secondary | ICD-10-CM

## 2020-06-02 DIAGNOSIS — R414 Neurologic neglect syndrome: Secondary | ICD-10-CM

## 2020-06-02 DIAGNOSIS — R2681 Unsteadiness on feet: Secondary | ICD-10-CM

## 2020-06-02 DIAGNOSIS — I639 Cerebral infarction, unspecified: Secondary | ICD-10-CM

## 2020-06-02 DIAGNOSIS — M6281 Muscle weakness (generalized): Secondary | ICD-10-CM

## 2020-06-02 MED ORDER — ATORVASTATIN CALCIUM 40 MG PO TABS: 40.0000 mg | ORAL_TABLET | Freq: Every day | ORAL | 3 refills | Status: AC

## 2020-06-02 NOTE — Therapy (Signed)
[facility] [address] [place], [place], [address] Phone: [phone]   Fax:  [phone]  Speech Language Pathology Treatment  Patient Details  Name: Timothy Baker MRN: [id number] Date of Birth: 08-Dec-1960 Referring Provider (SLP): Dr. Marvel Plan   Encounter Date: 06/02/2020   End of Session - 06/02/20 1140    Visit Number 7    Number of Visits 17    Date for SLP Re-Evaluation 07/01/20    Authorization Type BCBS    SLP Start Time 1100    SLP Stop Time  1145    SLP Time Calculation (min) 45 min    Activity Tolerance Patient tolerated treatment well           Past Medical History:  Diagnosis Date   Polysubstance abuse ([facility])    quit in 2019 - used ETOH, crack cocaine    Past Surgical History:  Procedure Laterality Date   genital surgery     LOOP RECORDER INSERTION N/A 05/01/2020   Procedure: LOOP RECORDER INSERTION;  Surgeon: Hillis Range, MD;  [facility]: [facility];  Service: Cardiovascular;  Laterality: N/A;    There were no vitals filed for this visit.   Subjective Assessment - 06/02/20 1102    Subjective "I had my follow up"    Currently in Pain? Yes    Pain Score 3     Pain Location Back    Pain Descriptors / Indicators Aching    Pain Type Acute pain    Pain Onset Today                 ADULT SLP TREATMENT - 06/02/20 1104      General Information   Behavior/Cognition Alert;Impulsive;Pleasant mood;Requires cueing      Treatment Provided   Treatment provided Cognitive-Linquistic      Cognitive-Linquistic Treatment   Treatment focused on Dysarthria;Cognition;Patient/family/caregiver education    Skilled Treatment SLP reviewed dysarthria HEP, in which pt reports he read articles "to myself" not aloud. SLP educated pt on need to practicing reading aloud to work on speech intelligibility and carryover of compensations. Pt reports he has been taking meds independently (AM  only: ~2 pills + nicotine patch) with no missed doses or errors. SLP targeted reading aloud for short paragraphs, in which pt able to maintain low to mid 70s db with min A. Frequent omissions and substitions noted initally, with repetition required. Errors reduced with SLP cues to track placement via finger. Increasing awareness demonstrated for structured speech tasks, including breathing, rate, and artic errors; however, in conversation, pt requires usual min A to improve rate and volume.      Assessment / Recommendations / Plan   Plan Continue with current plan of care      Progression Toward Goals   Progression toward goals Progressing toward goals            SLP Education - 06/02/20 1139    Education Details dysarthria HEP, reading aloud, abdominal breathing    Person(s) Educated Patient    Methods Explanation;Demonstration;Handout    Comprehension Verbalized understanding;Need further instruction            SLP Short Term Goals - 06/02/20 1112      SLP SHORT TERM GOAL #1   Title Pt will complete HEP for dysarthria with occasional min A    Baseline 05-28-20    Time 1    Period Weeks    Status On-going      SLP  SHORT TERM GOAL #2   Title Pt will average 70dB on sentence responses 14/15x with occasional min A    Baseline 05-28-20    Time 1    Period Weeks    Status On-going      SLP SHORT TERM GOAL #3   Title Pt will utilize compensations for memory to manage schedule, appointments and medication with occasional min A from family and friends    Baseline 06-02-20    Time 1    Period Weeks    Status On-going      SLP SHORT TERM GOAL #4   Title Pt will verbalize 3 safety concerns in the home and generate solutions to them (emergent awareness) with occasional min A    Baseline 05-28-20    Time 1    Period Weeks    Status On-going      SLP SHORT TERM GOAL #5   Title Pt will be 90% intelligible over 5 minute conversation in mildly noisy environment with occasional min A     Time 1    Period Weeks    Status On-going            SLP Long Term Goals - 06/02/20 1113      SLP LONG TERM GOAL #1   Title Pt will complete HEP for dysarthria with mod I    Time 5    Period Weeks    Status On-going      SLP LONG TERM GOAL #2   Title Pt will average 70dB over 15 minute conversation with rare min A    Time 5    Period Weeks    Status On-going      SLP LONG TERM GOAL #3   Title Pt will verbalize carryover of  anticipatory awareness for 3 safety situations in the home    Time 5    Period Weeks    Status On-going      SLP LONG TERM GOAL #4   Title Pt will be 95% intellgible over 15 minute conversation in noisy environment with rare min A    Time 5    Period Weeks    Status On-going      SLP LONG TERM GOAL #5   Title Score on Communicative Effectivess Survey will increase (family to fill out ) by 4 points.    Baseline 16    Time 5    Period Weeks    Status On-going            Plan - 06/02/20 1244    Clinical Impression Statement "Timothy Baker" presents with moderate mixed dysarthria c/b imprecise articulation, fast rate of speech, and decreased vocal intensity. Pt reports he is now independently managing medicines (2 AM pills & nicotine patch) with no missed doses or errors. Reading aloud HEP not completed as instructed, as pt endorsed he only read to himself instead of practicing speech compensations. SLP targeted speech intelligibility and compensations for reading aloud short paragraphs, in which pt exhibited improving awareness of errors and attempted to self-correct with min A. Pt ranged from low to mid 70s db with min A. In conversation, pt requires usual min A to maximize speech intelligibility and utilize targeted compensations. SLP recommends skilled ST to maximize intelligibility and cognitive communciation skills for safety, to reduce caregiver burden and return to PLOF.    Speech Therapy Frequency 2x /week    Duration 8 weeks   or 17 visits    Treatment/Interventions Diet toleration management by SLP;Environmental  controls;Cueing hierarchy;SLP instruction and feedback;Compensatory strategies;Functional tasks;Cognitive reorganization;Compensatory techniques;Internal/external aids;Multimodal communcation approach;Patient/family education    Potential to Achieve Goals Good    Potential Considerations Cooperation/participation level;Family/community support    SLP Home Exercise Plan dysarthria HEP    Consulted and Agree with Plan of Care Patient           Patient will benefit from skilled therapeutic intervention in order to improve the following deficits and impairments:   Dysarthria and anarthria  Cognitive communication deficit    Problem List Patient Active Problem List   Diagnosis Date Noted   Cerebral embolism with cerebral infarction 04/30/2020   Leukocytosis    Tobacco abuse    Prediabetes    Dyslipidemia    Tobacco dependence due to cigarettes 04/27/2020    Janann Colonel, MA CCC-SLP 06/02/2020, 12:53 PM  [facility] [address] [place], [place], [address] Phone: [phone]   Fax:  [phone]   Name: Timothy Baker MRN: [id number] Date of Birth: Dec 12, 1960

## 2020-06-02 NOTE — Progress Notes (Signed)
[facility] [address] [place] [address] [phone]       HOSPITAL FOLLOW UP NOTE  [name] Date of Birth:  02/03/1961 Medical Record Number:  [id number]   Reason for Referral:  hospital stroke follow up    SUBJECTIVE:   CHIEF COMPLAINT:  Chief Complaint  Patient presents with  . Follow-up    RM 14 with sister (ann) PT is having L sided weakness and balance is off     HPI:    Timothy Baker is a  60 year old male with only known medical history of tobacco use and borderline HTN who presented on 04/27/2020 with left-sided weakness, left facial droop, and dysarthria for approximately 3 days.  Personally reviewed hospitalization pertinent progress notes, lab work and imaging with summary provided.  Evaluated by Dr. Roda Shutters with stroke work-up revealing patchy multifocal acute infarcts involving the right CR, So and BG, embolic pattern secondary to unclear source.  Recommend placement of loop recorder outpatient to assess for atrial fibrillation.  Plans for TEE outpatient.  Recommended DAPT for 3 weeks and aspirin alone.  LDL 107 -initiate atorvastatin 40 mg daily.  Current tobacco use with smoking cessation counseling provided.  No prior stroke history.  Evaluated by therapies who initially recommended CIR but eventually discharged home with recommended outpatient therapies as CIR unable to accept patient.  Stroke - patchy multifocal acute infarcts involving the right CR, SO and BG, embolic pattern, source unclear  CT head Recent appearing and suspected acute infarct involving a portion ofthe anterior limb of the right internal capsule and immediately adjacent right centrum semiovale.    CTA head & neck: Normal CTA of the head and neck.  MRI  Patchy multifocal acute ischemic nonhemorrhagic infarcts involving the periventricular and deep white matter of the rightcorona radiata and centrum semi ovale, with involvement of the underlying right  basal ganglia.   2D Echo EF 60 to 65%. No shunt.   LE venous doppler - no DVT  TEE will be done as outpt once pt tooth condition improves to avoid aspiration  TCD bubble study no DVT  Recommend loop recorder placement to rule out afib   LDL 107  HgbA1c 5.9  UDS neg  Hypercoagulable lab unremarkable  VTE prophylaxis -Lovenox  No antithrombotic prior to admission, now on aspirin 81 mg daily and clopidogrel 75 mg daily DAPT for 3 weeks and then aspirin alone.  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 Denies new or worsening stroke/TIA symptoms   Remains on aspirin and Plavix despite 3-week recommendation but denies side effects Compliant on atorvastatin 40 mg daily without myalgias Blood pressure 118/73 Reports complete tobacco cessation since discharge -continues to use NicoDerm patches  Loop recorder has not shown atrial fibrillation thus far  TEE placed on hold until follow-up with dentistry for poor dentition -currently in the process of following up     ROS:   14 system review of systems performed and negative  with exception of those listed in HPI  PMH:  Past Medical History:  Diagnosis Date  . Polysubstance abuse (HCC)    quit in 2019 - used ETOH, crack cocaine    PSH:  Past Surgical History:  Procedure Laterality Date  . genital surgery    . LOOP RECORDER INSERTION N/A  05/01/2020   Procedure: LOOP RECORDER INSERTION;  Surgeon: Hillis Range, MD;  Location: [facility] INVASIVE [facility];  Service: Cardiovascular;  Laterality: N/A;    Social History:  Social History   Socioeconomic History  . Marital status: Single    Spouse name: Not on file  . Number of children: Not on file  . Years of education: Not on file  . Highest education level: Not on file  Occupational History  . Not on file  Tobacco Use  . Smoking status: Current Some Day Smoker    Packs/day: 0.50    Years: 45.00    Pack years: 22.50    Types: Cigarettes  . Smokeless tobacco: Never Used  Substance and Sexual Activity  . Alcohol use: Not Currently    Comment: quit 4 years ago (2018)  . Drug use: Not Currently    Types: "Crack" cocaine    Comment: quit 2018-2019  . Sexual activity: Not on file  Other Topics Concern  . Not on file  Social History Narrative  . Not on file   Social Determinants of Health   Financial Resource Strain: Not on file  Food Insecurity: Not on file  Transportation Needs: Not on file  Physical Activity: Not on file  Stress: Not on file  Social Connections: Not on file  Intimate Partner Violence: Not on file    Family History:  Family History  Problem Relation Age of Onset  . Stroke Neg Hx     Medications:   Current Outpatient Medications on File Prior to Visit  Medication Sig Dispense Refill  . aspirin 81 MG EC tablet Take 1 tablet (81 mg total) by mouth daily. Swallow whole. 150 tablet 0  . nicotine (NICODERM CQ - DOSED IN MG/24 HOURS) 14 mg/24hr patch Place 1 patch (14 mg total) onto the skin daily. 28 patch 0   No current facility-administered medications on file prior to visit.    Allergies:  No Known Allergies    OBJECTIVE:  Physical Exam  Vitals:   06/02/20 0908  BP: 118/73  Pulse: 73  Weight: 193 lb (87.5 kg)  Height: 6\' 3"  (1.905 m)   Body mass index is 24.12 kg/m. No exam data present  Post stroke PHQ 2/9 Depression screen  PHQ 2/9 06/02/2020  Decreased Interest 0  Down, Depressed, Hopeless 0  PHQ - 2 Score 0     General: well developed, well nourished,  pleasant middle-aged African-American male, seated, in no evident distress Head: head normocephalic and atraumatic.   Neck: supple with no carotid or supraclavicular bruits Cardiovascular: regular rate and rhythm, no murmurs Musculoskeletal: no deformity Skin:  no rash/petichiae Vascular:  Normal pulses all extremities   Neurologic Exam Mental Status: Awake and fully alert.   Mild to moderate dysarthria.  No evidence of aphasia.  Oriented to place and time. Recent and remote memory intact. Attention span, concentration and fund of knowledge appears appropriate during visit. Mood and affect appropriate.  Cranial Nerves: Fundoscopic exam reveals sharp disc margins. Pupils equal, briskly reactive to light. Extraocular movements full without nystagmus. Visual fields full to confrontation. Hearing intact. Facial sensation intact.  Left lower facial weakness.  Tongue and palate moves normally and symmetrically.  Motor: Normal strength, bulk and tone on right side. LUE: 4/5 EF and hand grip; mildly increased tone left shoulder LLE: 4/5 hip flexor otherwise 5/5 Sensory.: intact to touch , pinprick , position and vibratory sensation.  Coordination: Rapid alternating movements normal in all extremities except decreased left hand. Finger-to-nose and heel-to-shin performed accurately on right side.  Orbits right arm and left arm Gait and Station: Arises from chair without difficulty. Stance is normal. Gait demonstrates  slightly decreased stride length and step height LLE with mild imbalance and use of cane.  Tandem walk and heel toe not attempted.  Reflexes: 1+ and symmetric. Toes downgoing.     NIHSS  3 Modified Rankin  2-3      ASSESSMENT: Timothy Baker is a 60 y.o. year old male presented with 3-day history of left-sided weakness, left facial droop and  dysarthria on 04/27/2020 with stroke work-up revealing patchy multifocal acute infarcts involving R CR, So and BG, embolic pattern secondary to unclear source. Vascular risk factors include HLD and tobacco use.      PLAN:  1. Cryptogenic stroke:  a. Residual deficit: Left hemiparesis, gait impairment, mild cognitive impairment and dysarthria.  Continue working with neuro rehab PT/OT/SLP for likely ongoing recovery.  Continued short-term disability - apparently completed during hospitalization but unknown duration -May possibly need to be extended -advised him to contact his employer/HR department and if extension is required, he can follow-up with either his PCP or our office for further assistance.  Not cleared to return to driving at this present time due to residual cognitive impairment b. Loop recorder has not shown atrial fibrillation thus far -monitor by cardiology c. Plan on TEE once dental work completed d. Continue aspirin 81 mg daily  and atorvastatin 40 mg daily for secondary stroke prevention.  Advised to discontinue Plavix as 3 week duration completed and no indication for prolonged duration e. Discussed secondary stroke prevention measures and importance of close PCP follow up for aggressive stroke risk factor management  f. Interested in [place] trial -additional information provided by [employer] team 2. HLD: LDL goal <70. Recent LDL 107 - placed on atorvastatin 40 mg daily during recent stroke admission.  Refill provided per request but request additional refills provided by PCP as well as follow-up in the next 1 to 2 months for repeat lipid panel 3. Tobacco use: Congratulated on complete tobacco cessation -highly encouraged continued abstinence    Follow up in 3 months or call earlier if needed   CC:  [facility] provider: Dr. Pearlean Brownie PCP: Delma Officer, PA    I spent 45 minutes of face-to-face and non-face-to-face time with patient and sister.  This included previsit chart  review including recent hospitalization pertinent progress notes, lab work and imaging, lab review, study review, order entry, electronic health record documentation, patient and sister education and discussion regarding recent stroke including potential etiology, review of loop recorder and indication for TEE, residual deficits, importance of managing stroke risk factors and answered all other questions to patient and sisters satisfaction   Ihor Austin, AGNP-BC  [facility] [address] [place], [place] [address]  Phone [phone] Fax [phone] Note: This document was prepared with digital dictation and possible smart phrase technology. Any transcriptional errors that result from this process are unintentional.

## 2020-06-02 NOTE — Patient Instructions (Signed)
   Reading short stories again ALOUD. Breath at every time you see .,;  You want to be heard this first time. Be SLOW and LOUD. The less times you are asked to repeat yourself, the better!

## 2020-06-02 NOTE — Patient Instructions (Signed)
Continue working with therapies for likely ongoing recovery  Continue aspirin 81 mg daily  and atorvastatin 80mg  daily  for secondary stroke prevention  We will continue to monitor your loop recorder for possible atrial fibrillation  Continue to follow up with PCP regarding cholesterol management  Maintain strict control  cholesterol with LDL cholesterol (bad cholesterol) goal below 70 mg/dL.   Consider participation in [place] trial - additional information provided     Followup in the future with me in 3 months or call earlier if needed       Thank you for coming to see MÖLNDAL at [facility]. I hope we have been able to provide you high quality care today.  You may receive a patient satisfaction survey over the next few weeks. We would appreciate your feedback and comments so that we may continue to improve ourselves and the health of our patients.

## 2020-06-02 NOTE — Therapy (Signed)
[facility] [address] [place], [place], [address] Phone: [phone]   Fax:  [phone]  Occupational Therapy Treatment  Patient Details  Name: Timothy Baker MRN: [id number] Date of Birth: 1960/10/15 Referring Provider (OT): Dr. Marvel Plan   Encounter Date: 06/02/2020   OT End of Session - 06/02/20 1241    Visit Number 3    Number of Visits 16    Date for OT Re-Evaluation 07/10/20    Authorization Type BC/BS - VL 30 combined PT/OT (Speech has 30)    Authorization - Visit Number 3    Authorization - Number of Visits 15    OT Start Time 1234    OT Stop Time 1315    OT Time Calculation (min) 41 min    Activity Tolerance Patient tolerated treatment well    Behavior During Therapy Impulsive           Past Medical History:  Diagnosis Date  . Polysubstance abuse (HCC)    quit in 2019 - used ETOH, crack cocaine    Past Surgical History:  Procedure Laterality Date  . genital surgery    . LOOP RECORDER INSERTION N/A 05/01/2020   Procedure: LOOP RECORDER INSERTION;  Surgeon: Hillis Range, MD;  Location: [facility];  Service: Cardiovascular;  Laterality: N/A;    There were no vitals filed for this visit.   Subjective Assessment - 06/02/20 1236    Subjective  Pt reports back pain    Pertinent History CVA 04/27/20 with Lt hemiplegia. PMH: HTN, HLD, substance abuse    Limitations loop recorder 05/01/20, no driving    Currently in Pain? Yes    Pain Score 3     Pain Location Back    Pain Orientation Left    Pain Descriptors / Indicators Aching    Pain Type Acute pain    Pain Onset In the past 7 days    Pain Frequency Intermittent    Aggravating Factors  unknown    Pain Relieving Factors unknown                   Treatment: placing and removing large to small pegs from semi circle pegboard,min difficulty/ min v.c to avoid compensation. flipping cards and matching cards of  To cards of  same number, min v.c to avoid compensatory patterns 3/15 items missed on first pass for environmental scanning, pt ambulated with cane gait belt used as pt demonstrates continued impulsivity and decreased LUE step length.  Pt located 2/3  remaining items on second pass. Pt needs reinforcement of safe mobility with cane due to impulsivity and turning quickly. Writing activity using LUE, pt wrote his name and several sentences with at least 90-95% legibility.             OT Short Term Goals - 05/19/20 1134      OT SHORT TERM GOAL #1   Title Independent with HEP for Lt hand coordination and strength    Time 4    Period Weeks    Status On-going      OT SHORT TERM GOAL #2   Title Independent with LUE shoulder HEP    Time 4    Period Weeks    Status New      OT SHORT TERM GOAL #3   Title Pt to use Lt hand to feed self and groom 50% of the time    Baseline using Rt non dominant hand    Time  4    Period Weeks    Status New      OT SHORT TERM GOAL #4   Title Pt to make snack and perform light IADLS (washing dishes, folding towels) from standing position safely    Time 4    Period Weeks    Status New      OT SHORT TERM GOAL #5   Title Pt to perform LE dressing at mod I level w/ A/E prn    Time 4    Period Weeks    Status New      OT SHORT TERM GOAL #6   Title Pt to perform walker negotiation and environmental scanning through tight spaces w/ 85% accuracy and no bumps on Lt side    Time 4    Period Weeks    Status New             OT Long Term Goals - 05/12/20 1531      OT LONG TERM GOAL #1   Title Pt to improve coordination Lt hand as evidenced by performing 9 hole peg test in under 40 sec    Baseline 49.44 sec    Time 8    Period Weeks    Status New      OT LONG TERM GOAL #2   Title Pt to improve grip strength Lt hand to 80 lbs or greater    Baseline 72 lbs (Rt = 81)    Time 8    Period Weeks    Status New      OT LONG TERM GOAL #3   Title Pt to  return to simple cooking tasks and IADLS safely w/ DME/AE prn in prep for return to living alone    Time 8    Period Weeks    Status New      OT LONG TERM GOAL #4   Title Independent with memory compensatory strategies for medication management and paying bills    Time 8    Period Weeks    Status New      OT LONG TERM GOAL #5   Title Pt to use Lt dominant hand 90% for eating and grooming tasks    Time 8    Period Weeks    Status New                 Plan - 06/02/20 1242    Clinical Impression Statement Pt requires cueing throughout session to use Lt hand and look to Lt side/attend to Lt side. Pt easily distracted in busy gym and needs redirection    OT Occupational Profile and History Detailed Assessment- Review of Records and additional review of physical, cognitive, psychosocial history related to current functional performance    Occupational performance deficits (Please refer to evaluation for details): ADL's;IADL's;Work    Body Structure / Function / Physical Skills ADL;Strength;Dexterity;Balance;Body mechanics;Proprioception;UE functional use;IADL;ROM;Endurance;Coordination;Mobility;FMC;Decreased knowledge of precautions    Cognitive Skills Attention;Safety Awareness;Memory;Perception    Rehab Potential Good    Clinical Decision Making Several treatment options, min-mod task modification necessary    Comorbidities Affecting Occupational Performance: May have comorbidities impacting occupational performance    Modification or Assistance to Complete Evaluation  Min-Moderate modification of tasks or assist with assess necessary to complete eval    OT Frequency 2x / week    OT Duration 8 weeks    OT Treatment/Interventions Self-care/ADL training;DME and/or AE instruction;Therapeutic activities;Aquatic Therapy;Therapeutic exercise;Cognitive remediation/compensation;Coping strategies training;Neuromuscular education;Functional Mobility Training;Passive range of  motion;Visual/perceptual remediation/compensation;Patient/family education;Manual  Therapy;Moist Heat    Plan review of HEP prn, continue to reinforce use of LUE for safe tasks, visual scanning - try environmental scanning and walker negotiation w/ walker if safe/able    Consulted and Agree with Plan of Care Patient           Patient will benefit from skilled therapeutic intervention in order to improve the following deficits and impairments:   Body Structure / Function / Physical Skills: ADL,Strength,Dexterity,Balance,Body mechanics,Proprioception,UE functional use,IADL,ROM,Endurance,Coordination,Mobility,FMC,Decreased knowledge of precautions Cognitive Skills: Attention,Safety Awareness,Memory,Perception     Visit Diagnosis: Hemiplegia and hemiparesis following cerebral infarction affecting left dominant side (HCC)  Other lack of coordination  Neurologic neglect syndrome  Muscle weakness (generalized)  Other symptoms and signs involving cognitive functions following cerebral infarction    Problem List Patient Active Problem List   Diagnosis Date Noted  . 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 Leukocytosis   . Tobacco abuse   . Prediabetes   . Dyslipidemia   . Tobacco dependence due to cigarettes 04/27/2020    Azriel Dancy 06/02/2020, 12:43 PM  [facility] [address] [place], [place], [address] Phone: [phone]   Fax:  [phone]  Name: FOY VANDUYNE MRN: [id number] Date of Birth: 05-29-1960

## 2020-06-02 NOTE — Therapy (Signed)
[facility] [address] [place], [place], [address] Phone: [phone]   Fax:  [phone]  Physical Therapy Treatment  Patient Details  Name: Timothy Baker MRN: [id number] Date of Birth: December 12, 1960 Referring Provider (PT): Seward Carol MD   Encounter Date: 06/02/2020   PT End of Session - 06/02/20 1155    Visit Number 7    Number of Visits 16    Date for PT Re-Evaluation 06/04/20    Authorization Type BCBS    PT Start Time 5681    PT Stop Time 1230    PT Time Calculation (min) 45 min    Equipment Utilized During Treatment Gait belt    Activity Tolerance Patient tolerated treatment well    Behavior During Therapy Impulsive;WFL for tasks assessed/performed           Past Medical History:  Diagnosis Date  . Polysubstance abuse ([place])    quit in 2019 - used ETOH, crack cocaine    Past Surgical History:  Procedure Laterality Date  . genital surgery    . LOOP RECORDER INSERTION N/A 05/01/2020   Procedure: LOOP RECORDER INSERTION;  Surgeon: Thompson Grayer, MD;  Location: [facility];  Service: Cardiovascular;  Laterality: N/A;    There were no vitals filed for this visit.   Subjective Assessment - 06/02/20 1152    Subjective awoke with low back pain today, has an unfilled Rx for muscle relaxers which he is considering filling    Pertinent History 60 yo male with onset of L side weakness sustained a fall climbing up to change smoke detector battery on 2/5, then on 2/6 came to ED with significant changes in his L side facially and in L extremities.  On imaging his notes were that the infarction was in R anterior limb of internal capsule.  Pt has memory changes, coordination changes and impulsive behavior.  PMHx:  substance abuse, HTN, HLD, tobacco use    Limitations Standing;Walking    How long can you sit comfortably? unlimited    How long can you stand comfortably? 5-10'    How long can you walk  comfortably? 5-10'    Patient Stated Goals To return to I ambulation and mobility    Currently in Pain? Yes    Pain Score 2     Pain Location Back    Pain Descriptors / Indicators Aching    Pain Type Acute pain    Pain Onset Today    Pain Frequency Intermittent                             [facility] Adult PT Treatment/Exercise - 06/02/20 0001      Transfers   Transfers Sit to Stand    Sit to Stand 5: Supervision    Sit to Stand Details Tactile cues for weight shifting    Sit to Stand Details (indicate cue type and reason) performed from airex needing RUE assist      Ambulation/Gait   Ambulation/Gait Yes    Ambulation/Gait Assistance 5: Supervision    Ambulation Distance (Feet) 115 Feet    Assistive device Straight cane    Gait Pattern Decreased arm swing - left    Ambulation Surface Level;Indoor      Knee/Hip Exercises: Standing   Terminal Knee Extension Strengthening    Theraband Level (Terminal Knee Extension) Level 3 (Green)    Terminal Knee Extension Limitations standing with cane pulling  from 3 directions    Forward Step Up Both;3 sets;10 reps;Hand Hold: 1;Limitations    Forward Step Up Limitations performed with rocker board    Step Down Both;3 sets;10 reps;Hand Hold: 2;Step Height: 2";Limitations    Step Down Limitations 3x10 ea. LE in // bars in both fwd and lateral directions    Other Standing Knee Exercises runner steps using 4" block and cane across bars serving as a target                  PT Education - 06/02/20 1154    Education Details Instructed to fill Rx for muscle rakxers if symptoms persist    Person(s) Educated Patient    Methods Explanation    Comprehension Verbalized understanding            PT Short Term Goals - 05/19/20 1821      PT SHORT TERM GOAL #1   Title patient to tranfer STS under S with no physical assist    Baseline Min-Mod A to transfer STS; 05/19/20 able to STS transfer under S only    Time 2    Period  Weeks    Status Achieved    Target Date 05/19/20      PT SHORT TERM GOAL #2   Title Patient to demo established HEP follwing development of written exercises based on patients abilities and deficits    Baseline no HEP; verbally reviewed exercises    Time 2    Period Weeks    Status Partially Met    Target Date 05/19/20      PT SHORT TERM GOAL #3   Title Patient to ambulate 166f x2 with RW and close S of PT    Baseline 1140fwith rollator and Min A of PT; ambulation of 11538fith SPC and CGA    Time 2    Period Weeks    Status Achieved    Target Date 05/19/20      PT SHORT TERM GOAL #4   Title Assess DGI and MCTSIB and establish goals    Baseline not tested    Time 2    Period Days    Status On-going    Target Date 05/19/20             PT Long Term Goals - 05/05/20 1135      PT LONG TERM GOAL #1   Title Ambulation of 500f65fth LRAD and S of PT across level ground    Baseline 115ft5fh RW and MinA of PT across level surface    Time 8    Period Weeks    Status New    Target Date 06/30/20      PT LONG TERM GOAL #2   Title Patient to increase LLE strength to 4/5 grossly throughout    Baseline 3-3+/5 grossly throughout    Time 8    Period Weeks    Target Date 06/30/20      PT LONG TERM GOAL #3   Title Patient to transfer and mobilize from all surfaces with ony distant S    Baseline patient transfers STS with Min A/CGA and CGA for bed mobility    Time 8    Period Weeks    Status New    Target Date 06/30/20      PT LONG TERM GOAL #4   Title Assess progress towards DGI and MCTSIB goals    Baseline not set    Time 8  Period Weeks    Status New    Target Date 06/30/20                 Plan - 06/02/20 1157    Clinical Impression Statement Arrives with low back pain as documented in pain section, focus of todays skilled session was LLE strength, balance and coordination through placement strategies.  Back pain essentially resolved at end of PT  session    Personal Factors and Comorbidities Comorbidity 3+    Comorbidities HTN, HLD, CVA    Examination-Activity Limitations Stand;Stairs    Examination-Participation Restrictions Occupation    Stability/Clinical Decision Making Evolving/Moderate complexity    Rehab Potential Good    PT Frequency 2x / week    PT Duration 8 weeks    PT Treatment/Interventions DME Instruction;Gait training;Stair training;Functional mobility training;Therapeutic activities;Therapeutic exercise;Balance training;Neuromuscular re-education;Cognitive remediation;Patient/family education;Orthotic Fit/Training;Dry needling    PT Next Visit Plan assess FGA/DGI as appropriate, check STGs, cotinue to emphasize LLE hip and knee flexion to improve swing through gait and reduce fall risk    PT Home Exercise Plan issued HEP as documented elsewhere    Consulted and Agree with Plan of Care Family member/caregiver;Patient           Patient will benefit from skilled therapeutic intervention in order to improve the following deficits and impairments:  Abnormal gait,Decreased balance,Decreased endurance,Decreased mobility,Difficulty walking,Decreased cognition,Decreased knowledge of precautions,Impaired perceived functional ability,Decreased activity tolerance,Decreased coordination,Decreased safety awareness,Decreased strength  Visit Diagnosis: Unsteadiness on feet  Hemiplegia and hemiparesis following cerebral infarction affecting left dominant side [facility])     Problem List Patient Active Problem List   Diagnosis Date Noted  . 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 Leukocytosis   . Tobacco abuse   . Prediabetes   . Dyslipidemia   . Tobacco dependence due to cigarettes 04/27/2020    Lanice Shirts 06/02/2020, 2:30 PM  [facility] [address] [place], [place], [address] Phone: [phone]   Fax:  [phone]  Name: CLIFF DAMIANI MRN: [id number] Date of Birth: November 21, 1960

## 2020-06-07 NOTE — Progress Notes (Signed)
I agree with the above plan 

## 2020-06-08 ENCOUNTER — Ambulatory Visit (INDEPENDENT_AMBULATORY_CARE_PROVIDER_SITE_OTHER): Payer: BC Managed Care – PPO

## 2020-06-08 DIAGNOSIS — I639 Cerebral infarction, unspecified: Secondary | ICD-10-CM | POA: Diagnosis not present

## 2020-06-08 LAB — CUP PACEART REMOTE DEVICE CHECK
Date Time Interrogation Session: 20220320192617
Implantable Pulse Generator Implant Date: 20220211

## 2020-06-09 ENCOUNTER — Ambulatory Visit: Payer: BC Managed Care – PPO | Admitting: Occupational Therapy

## 2020-06-09 ENCOUNTER — Other Ambulatory Visit: Payer: Self-pay

## 2020-06-09 ENCOUNTER — Ambulatory Visit: Payer: BC Managed Care – PPO

## 2020-06-09 DIAGNOSIS — R2681 Unsteadiness on feet: Secondary | ICD-10-CM | POA: Diagnosis not present

## 2020-06-09 DIAGNOSIS — R278 Other lack of coordination: Secondary | ICD-10-CM

## 2020-06-09 DIAGNOSIS — R414 Neurologic neglect syndrome: Secondary | ICD-10-CM

## 2020-06-09 DIAGNOSIS — M6281 Muscle weakness (generalized): Secondary | ICD-10-CM

## 2020-06-09 DIAGNOSIS — R41841 Cognitive communication deficit: Secondary | ICD-10-CM

## 2020-06-09 DIAGNOSIS — R471 Dysarthria and anarthria: Secondary | ICD-10-CM

## 2020-06-09 DIAGNOSIS — I69352 Hemiplegia and hemiparesis following cerebral infarction affecting left dominant side: Secondary | ICD-10-CM

## 2020-06-09 NOTE — Therapy (Signed)
[facility] [address] [place], [place], [address] Phone: [phone]   Fax:  [phone]  Physical Therapy Treatment  Patient Details  Name: Timothy Baker MRN: [id number] Date of Birth: 1961-02-11 Referring Provider (PT): Seward Carol MD   Encounter Date: 06/09/2020   PT End of Session - 06/09/20 1428    Visit Number 8    Number of Visits 16    Date for PT Re-Evaluation 06/04/20    Authorization Type BCBS    PT Start Time 1150    PT Stop Time 1230    PT Time Calculation (min) 40 min    Equipment Utilized During Treatment Gait belt    Activity Tolerance Patient tolerated treatment well    Behavior During Therapy Impulsive           Past Medical History:  Diagnosis Date  . Polysubstance abuse ([place])    quit in 2019 - used ETOH, crack cocaine    Past Surgical History:  Procedure Laterality Date  . genital surgery    . LOOP RECORDER INSERTION N/A 05/01/2020   Procedure: LOOP RECORDER INSERTION;  Surgeon: Thompson Grayer, MD;  Location: [facility];  Service: Cardiovascular;  Laterality: N/A;    There were no vitals filed for this visit.   Subjective Assessment - 06/09/20 1216    Subjective Low back pain resolved with muscle relaxers, has not had symptoms since last session.  Biggest concern is tall steps    Patient is accompained by: Family member   sister Webb Silversmith   Pertinent History 60 yo male with onset of L side weakness sustained a fall climbing up to change smoke detector battery on 2/5, then on 2/6 came to ED with significant changes in his L side facially and in L extremities.  On imaging his notes were that the infarction was in R anterior limb of internal capsule.  Pt has memory changes, coordination changes and impulsive behavior.  PMHx:  substance abuse, HTN, HLD, tobacco use    Limitations Standing;Walking    How long can you sit comfortably? unlimited    How long can you stand  comfortably? 5-10'    How long can you walk comfortably? 5-10'    Patient Stated Goals To return to I ambulation and mobility    Pain Onset In the past 7 days                             [facility] Adult PT Treatment/Exercise - 06/09/20 0001      Ambulation/Gait   Ambulation/Gait Yes    Ambulation/Gait Assistance 5: Supervision;4: Min guard    Ambulation Distance (Feet) 1000 Feet    Assistive device Straight cane    Gait Pattern Decreased arm swing - left;Decreased step length - left    Ambulation Surface Level;Unlevel;Indoor;Outdoor;Grass    Gait Comments 425f across grass, 6060facross paved sidewalk      Knee/Hip Exercises: Aerobic   Nustep 8' L4 arms 12      Knee/Hip Exercises: Standing   Terminal Knee Extension Strengthening;Left;3 sets;15 reps;Theraband;Limitations    Theraband Level (Terminal Knee Extension) Level 3 (Green)    Terminal Knee Extension Limitations standing with cane    Forward Step Up Left;1 set;15 reps    Forward Step Up Limitations performed from solid surface to 6" block with Airex    Rocker Board Limitations    Rocker Board Limitations performed step ups and step  overs using low profile high profile rocker board    Other Standing Knee Exercises runner steps using 4" block and cane across bars serving as a target                    PT Short Term Goals - 05/19/20 1821      PT SHORT TERM GOAL #1   Title patient to tranfer STS under S with no physical assist    Baseline Min-Mod A to transfer STS; 05/19/20 able to STS transfer under S only    Time 2    Period Weeks    Status Achieved    Target Date 05/19/20      PT SHORT TERM GOAL #2   Title Patient to demo established HEP follwing development of written exercises based on patients abilities and deficits    Baseline no HEP; verbally reviewed exercises    Time 2    Period Weeks    Status Partially Met    Target Date 05/19/20      PT SHORT TERM GOAL #3   Title Patient to  ambulate 139f x2 with RW and close S of PT    Baseline 1173fwith rollator and Min A of PT; ambulation of 11551fith SPC and CGA    Time 2    Period Weeks    Status Achieved    Target Date 05/19/20      PT SHORT TERM GOAL #4   Title Assess DGI and MCTSIB and establish goals    Baseline not tested    Time 2    Period Days    Status On-going    Target Date 05/19/20             PT Long Term Goals - 05/05/20 1135      PT LONG TERM GOAL #1   Title Ambulation of 500f58fth LRAD and S of PT across level ground    Baseline 115ft62fh RW and MinA of PT across level surface    Time 8    Period Weeks    Status New    Target Date 06/30/20      PT LONG TERM GOAL #2   Title Patient to increase LLE strength to 4/5 grossly throughout    Baseline 3-3+/5 grossly throughout    Time 8    Period Weeks    Target Date 06/30/20      PT LONG TERM GOAL #3   Title Patient to transfer and mobilize from all surfaces with ony distant S    Baseline patient transfers STS with Min A/CGA and CGA for bed mobility    Time 8    Period Weeks    Status New    Target Date 06/30/20      PT LONG TERM GOAL #4   Title Assess progress towards DGI and MCTSIB goals    Baseline not set    Time 8    Period Weeks    Status New    Target Date 06/30/20                 Plan - 06/09/20 1429    Clinical Impression Statement Focus of todays rehab session was outside ambulation across level and unlevel ground, stepping activities to address deficits encountered when negotiating tall steps, continued strengthening and training of L knee to promote TKE and reduce fall risk    Personal Factors and Comorbidities Comorbidity 3+    Comorbidities HTN, HLD, CVA  Examination-Activity Limitations Stand;Stairs    Examination-Participation Restrictions Occupation    Stability/Clinical Decision Making Evolving/Moderate complexity    Rehab Potential Good    PT Frequency 2x / week    PT Duration 8 weeks    PT  Treatment/Interventions DME Instruction;Gait training;Stair training;Functional mobility training;Therapeutic activities;Therapeutic exercise;Balance training;Neuromuscular re-education;Cognitive remediation;Patient/family education;Orthotic Fit/Training;Dry needling    PT Next Visit Plan assess FGA/DGI as appropriate, check STGs, follow through with stepping tasks    PT Home Exercise Plan issued HEP as documented elsewhere    Consulted and Agree with Plan of Care Family member/caregiver;Patient           Patient will benefit from skilled therapeutic intervention in order to improve the following deficits and impairments:  Abnormal gait,Decreased balance,Decreased endurance,Decreased mobility,Difficulty walking,Decreased cognition,Decreased knowledge of precautions,Impaired perceived functional ability,Decreased activity tolerance,Decreased coordination,Decreased safety awareness,Decreased strength  Visit Diagnosis: Unsteadiness on feet  Muscle weakness (generalized)     Problem List Patient Active Problem List   Diagnosis Date Noted  . 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 Leukocytosis   . Tobacco abuse   . Prediabetes   . Dyslipidemia   . Tobacco dependence due to cigarettes 04/27/2020    Lanice Shirts PT 06/09/2020, 2:35 PM  [facility] [address] [place], [place], [address] Phone: [phone]   Fax:  [phone]  Name: QUAN CYBULSKI MRN: [id number] Date of Birth: 07/26/1960

## 2020-06-09 NOTE — Therapy (Signed)
[facility] [address] [place], [place], [address] Phone: [phone]   Fax:  [phone]  Occupational Therapy Treatment  Patient Details  Name: Timothy Baker MRN: [id number] Date of Birth: 10/30/1960 Referring Provider (OT): Dr. Rosalin Hawking   Encounter Date: 06/09/2020   OT End of Session - 06/09/20 1129    Visit Number 4    Number of Visits 16    Date for OT Re-Evaluation 07/10/20    Authorization Type BC/BS - VL 30 combined PT/OT (Speech has 30)    Authorization - Visit Number 4    Authorization - Number of Visits 15    OT Start Time 1100    OT Stop Time 1145    OT Time Calculation (min) 45 min    Activity Tolerance Patient tolerated treatment well    Behavior During Therapy Impulsive           Past Medical History:  Diagnosis Date  . Polysubstance abuse ([place])    quit in 2019 - used ETOH, crack cocaine    Past Surgical History:  Procedure Laterality Date  . genital surgery    . LOOP RECORDER INSERTION N/A 05/01/2020   Procedure: LOOP RECORDER INSERTION;  Surgeon: Thompson Grayer, MD;  Location: [facility];  Service: Cardiovascular;  Laterality: N/A;    There were no vitals filed for this visit.   Subjective Assessment - 06/09/20 1105    Subjective  I'm taking a new muscle relaxer    Pertinent History CVA 04/27/20 with Lt hemiplegia. PMH: HTN, HLD, substance abuse    Limitations loop recorder 05/01/20, no driving    Currently in Pain? No/denies          Pt copying small peg design with Lt hand with 100% accuracy and min difficulty Lt hand. Pt also demo donning/doffing shoes and socks and tying shoes I'y and reports bathing I'ly and eating 100% w/ Lt hand, but brushing teeth w/ Rt hand. Pt given options on how to use Lt hand some for brushing teeth including doing front teeth w/ Lt hand and then finishing w/ Rt or using electric toothbrush w/ Lt hand.  Gripper set at level 3 (silver coil)  for picking up blocks Lt hand for sustained grip strength, control, and attention to Lt side - began at level 4 but unable to do many at this resistance. Pt required 1 rest break.  Environmental scanning finding 12/16 items on first pass, missing 2 on Lt and 2 lower Rt. Pt found 3/4 remaining items on 2nd pass.  Pt copying titles of articles in print (all caps) w/ 100% legibility, then writing same titles in cursive w/ decreased legibility and some micrographia. Recommended pt practice cursive and warm up w/ cursive L's. Pt however clarified that he usually only writes name in cursive but everything else in print                       OT Short Term Goals - 06/09/20 1130      OT [place] #1   Title Independent with HEP for Lt hand coordination and strength    Time 4    Period Weeks    Status Achieved      OT SHORT TERM GOAL #2   Title Independent with LUE shoulder HEP    Time 4    Period Weeks    Status New      OT SHORT TERM GOAL #3  Title Pt to use Lt hand to feed self and groom 50% of the time    Baseline using Rt non dominant hand    Time 4    Period Weeks    Status Partially Met   100% w/ feeding per pt report, brushing teeth w/ Rt hand     OT SHORT TERM GOAL #4   Title Pt to make snack and perform light IADLS (washing dishes, folding towels) from standing position safely    Time 4    Period Weeks    Status On-going   Pt folding towels     OT SHORT TERM GOAL #5   Title Pt to perform LE dressing at mod I level w/ A/E prn    Time 4    Period Weeks    Status Achieved      OT SHORT TERM GOAL #6   Title Pt to perform walker negotiation and environmental scanning through tight spaces w/ 85% accuracy and no bumps on Lt side    Time 4    Period Weeks    Status On-going   found 12/16 using cane            OT Long Term Goals - 05/12/20 1531      OT LONG TERM GOAL #1   Title Pt to improve coordination Lt hand as evidenced by performing 9 hole  peg test in under 40 sec    Baseline 49.44 sec    Time 8    Period Weeks    Status New      OT LONG TERM GOAL #2   Title Pt to improve grip strength Lt hand to 80 lbs or greater    Baseline 72 lbs (Rt = 81)    Time 8    Period Weeks    Status New      OT LONG TERM GOAL #3   Title Pt to return to simple cooking tasks and IADLS safely w/ DME/AE prn in prep for return to living alone    Time 8    Period Weeks    Status New      OT LONG TERM GOAL #4   Title Independent with memory compensatory strategies for medication management and paying bills    Time 8    Period Weeks    Status New      OT LONG TERM GOAL #5   Title Pt to use Lt dominant hand 90% for eating and grooming tasks    Time 8    Period Weeks    Status New                 Plan - 06/09/20 1132    Clinical Impression Statement Pt progressing towards goals with increased use of LUE and independence with BADLS    OT Occupational Profile and History Detailed Assessment- Review of Records and additional review of physical, cognitive, psychosocial history related to current functional performance    Occupational performance deficits (Please refer to evaluation for details): ADL's;IADL's;Work    Body Structure / Function / Physical Skills ADL;Strength;Dexterity;Balance;Body mechanics;Proprioception;UE functional use;IADL;ROM;Endurance;Coordination;Mobility;FMC;Decreased knowledge of precautions    Cognitive Skills Attention;Safety Awareness;Memory;Perception    Rehab Potential Good    Clinical Decision Making Several treatment options, min-mod task modification necessary    Comorbidities Affecting Occupational Performance: May have comorbidities impacting occupational performance    Modification or Assistance to Complete Evaluation  Min-Moderate modification of tasks or assist with assess necessary to complete eval  OT Frequency 2x / week    OT Duration 8 weeks    OT Treatment/Interventions Self-care/ADL  training;DME and/or AE instruction;Therapeutic activities;Aquatic Therapy;Therapeutic exercise;Cognitive remediation/compensation;Coping strategies training;Neuromuscular education;Functional Mobility Training;Passive range of motion;Visual/perceptual remediation/compensation;Patient/family education;Manual Therapy;Moist Heat    Plan LUE shoulder HEP, environmental scanning in bugy gym, safety w/ mobility/ADLS.    Consulted and Agree with Plan of Care Patient           Patient will benefit from skilled therapeutic intervention in order to improve the following deficits and impairments:   Body Structure / Function / Physical Skills: ADL,Strength,Dexterity,Balance,Body mechanics,Proprioception,UE functional use,IADL,ROM,Endurance,Coordination,Mobility,FMC,Decreased knowledge of precautions Cognitive Skills: Attention,Safety Awareness,Memory,Perception     Visit Diagnosis: Hemiplegia and hemiparesis following cerebral infarction affecting left dominant side ([facility])  Other lack of coordination  Neurologic neglect syndrome  Muscle weakness (generalized)    Problem List Patient Active Problem List   Diagnosis Date Noted  . 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 Leukocytosis   . Tobacco abuse   . Prediabetes   . Dyslipidemia   . Tobacco dependence due to cigarettes 04/27/2020    Carey Bullocks, OTR/L 06/09/2020, 11:39 AM  [facility] [address] [place], [place], [address] Phone: [phone]   Fax:  [phone]  Name: Timothy Baker MRN: [id number] Date of Birth: [birth year]

## 2020-06-09 NOTE — Therapy (Signed)
[facility] [address] [place], [place], [address] Phone: [phone]   Fax:  [phone]  Speech Language Pathology Treatment  Patient Details  Name: Timothy Baker MRN: [id number] Date of Birth: June 03, 1960 Referring Provider (SLP): Dr. Rosalin Hawking   Encounter Date: 06/09/2020   End of Session - 06/09/20 1237    Visit Number 8    Number of Visits 17    Date for SLP Re-Evaluation 07/01/20    Authorization Type BCBS    SLP Start Time 4665    SLP Stop Time  9935    SLP Time Calculation (min) 45 min    Activity Tolerance Patient tolerated treatment well           Past Medical History:  Diagnosis Date  . Polysubstance abuse ([facility])    quit in 2019 - used ETOH, crack cocaine    Past Surgical History:  Procedure Laterality Date  . genital surgery    . LOOP RECORDER INSERTION N/A 05/01/2020   Procedure: LOOP RECORDER INSERTION;  Surgeon: Thompson Grayer, MD;  Location: [facility];  Service: Cardiovascular;  Laterality: N/A;    There were no vitals filed for this visit.   Subjective Assessment - 06/09/20 1232    Subjective "they got the best of me" re: PT session    Currently in Pain? Yes    Pain Score 2     Pain Location Back    Pain Orientation Lower    Pain Type Acute pain    Pain Onset In the past 7 days                 ADULT SLP TREATMENT - 06/09/20 1233      General Information   Behavior/Cognition Alert;Impulsive;Pleasant mood;Requires cueing      Treatment Provided   Treatment provided Cognitive-Linquistic      Cognitive-Linquistic Treatment   Treatment focused on Dysarthria;Cognition;Patient/family/caregiver education    Skilled Treatment Pt reports speech is near baseline, as pt stated "even before I had to repeat myself." Pt reports he is managing medicines without incident. SLP engaged patient in conversation to address anticipatory awareness for return to PLOF and  independence. Mod A fading to min A required to verbally ID home scenarios and verbally sequence steps to complete household tasks. In conversation, pt averaged mid 39s db, which only improved to upper 60s with usual mod cues to increase volume and decrease rate. Suspect decreased motivation to improve speech intelligibility as this time. Low 70s db achieved for structured sentence task with occasional min A to demo compensations at sentence level. HEP not completed since last session, as pt stated he lost the paper.      Assessment / Recommendations / Plan   Plan Continue with current plan of care      Progression Toward Goals   Progression toward goals Progressing toward goals            SLP Education - 06/09/20 1306    Education Details dysarthria HEP, reading aloud, compensations    Person(s) Educated Patient    Methods Explanation;Demonstration;Handout    Comprehension Verbalized understanding;Returned demonstration;Need further instruction            SLP Short Term Goals - 06/09/20 1131      SLP SHORT TERM GOAL #1   Title Pt will complete HEP for dysarthria with occasional min A    Baseline 05-28-20    Period Weeks    Status Partially Met  SLP SHORT TERM GOAL #2   Title Pt will average 70dB on sentence responses 14/15x with occasional min A    Baseline 05-28-20    Period Weeks    Status Achieved      SLP SHORT TERM GOAL #3   Title Pt will utilize compensations for memory to manage schedule, appointments and medication with occasional min A from family and friends    Baseline 06-02-20    Period Weeks    Status Partially Met      SLP SHORT TERM GOAL #4   Title Pt will verbalize 3 safety concerns in the home and generate solutions to them (emergent awareness) with occasional min A    Baseline 05-28-20    Period Weeks    Status Partially Met      SLP SHORT TERM GOAL #5   Title Pt will be 90% intelligible over 5 minute conversation in mildly noisy environment with  occasional min A    Period Weeks    Status Not Met            SLP Long Term Goals - 06/09/20 1132      SLP LONG TERM GOAL #1   Title Pt will complete HEP for dysarthria with mod I    Time 4    Period Weeks    Status On-going      SLP LONG TERM GOAL #2   Title Pt will average 70dB over 15 minute conversation with rare min A    Time 4    Period Weeks    Status On-going      SLP LONG TERM GOAL #3   Title Pt will verbalize carryover of  anticipatory awareness for 3 safety situations in the home    Time 4    Period Weeks    Status On-going      SLP LONG TERM GOAL #4   Title Pt will be 95% intellgible over 15 minute conversation in noisy environment with rare min A    Time 4    Period Weeks    Status On-going      SLP LONG TERM GOAL #5   Title Score on Communicative Effectivess Survey will increase (family to fill out ) by 4 points.    Baseline 16    Time 4    Period Weeks    Status On-going            Plan - 06/09/20 1306    Clinical Impression Statement "Timothy Baker" presents with moderate mixed dysarthria c/b imprecise articulation, fast rate of speech, and decreased vocal intensity. Pt reports speech in nearing baseline, although frequent repetitions required in conversation due to limited carryover of learned compensations. Reading aloud HEP not completed as instructed, as pt lost the paper. SLP educated patient on importance of practice and consistency in improving speech intelligibilty, in which pt verbalized understanding. Pt reports he is still independently managing medicines (2 AM pills & nicotine patch) with no missed doses or errors. Pt able to verbalize prior areas of independence for home environment with mod fading to min prompting. Pt able to verbally sequence completion of household tasks with min A, although decreased loudness exhibited. SLP recommends skilled ST to maximize intelligibility and cognitive communciation skills for safety, to reduce caregiver burden  and return to PLOF.    Speech Therapy Frequency 2x / week    Duration 8 weeks   or 17 visits   Treatment/Interventions Diet toleration management by SLP;Environmental controls;Cueing hierarchy;SLP instruction and feedback;Compensatory  strategies;Functional tasks;Cognitive reorganization;Compensatory techniques;Internal/external aids;Multimodal communcation approach;Patient/family education    Potential to Achieve Goals Good    Potential Considerations Cooperation/participation level;Family/community support    SLP Home Exercise Plan dysarthria HEP    Consulted and Agree with Plan of Care Patient           Patient will benefit from skilled therapeutic intervention in order to improve the following deficits and impairments:   Dysarthria and anarthria  Cognitive communication deficit    Problem List Patient Active Problem List   Diagnosis Date Noted  . 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 Leukocytosis   . Tobacco abuse   . Prediabetes   . Dyslipidemia   . Tobacco dependence due to cigarettes 04/27/2020    Alinda Deem, MA CCC-SLP 06/09/2020, 2:23 PM  [facility] [address] [place], [place], [address] Phone: [phone]   Fax:  [phone]   Name: Timothy Baker MRN: [id number] Date of Birth: 12/17/1960

## 2020-06-16 ENCOUNTER — Other Ambulatory Visit: Payer: Self-pay

## 2020-06-16 ENCOUNTER — Ambulatory Visit: Payer: BC Managed Care – PPO | Admitting: Occupational Therapy

## 2020-06-16 ENCOUNTER — Ambulatory Visit: Payer: BC Managed Care – PPO

## 2020-06-16 DIAGNOSIS — R2681 Unsteadiness on feet: Secondary | ICD-10-CM

## 2020-06-16 DIAGNOSIS — R414 Neurologic neglect syndrome: Secondary | ICD-10-CM

## 2020-06-16 DIAGNOSIS — R41841 Cognitive communication deficit: Secondary | ICD-10-CM

## 2020-06-16 DIAGNOSIS — R471 Dysarthria and anarthria: Secondary | ICD-10-CM

## 2020-06-16 DIAGNOSIS — I69352 Hemiplegia and hemiparesis following cerebral infarction affecting left dominant side: Secondary | ICD-10-CM

## 2020-06-16 DIAGNOSIS — M6281 Muscle weakness (generalized): Secondary | ICD-10-CM

## 2020-06-16 NOTE — Patient Instructions (Signed)
Cranial Flexion: Overhead Arm Extension - Supine ([facility] [employer])    Lie with knees bent, arms beyond head, holding paper towel roll. Pull ball up to above face and back down to things slowly and controlled. Repeat _10___ times per set. Do __2__ sets per day.    Scapular Retraction (Prone)    Lie with arms at sides. Pinch shoulder blades together and towards buttocks (put shoulder blades in back pockets). Repeat _10___ times per set.  Do __2__ sessions per day.

## 2020-06-16 NOTE — Therapy (Signed)
[facility] [address] [place], [place], [address] Phone: [phone]   Fax:  [phone]  Speech Language Pathology Treatment  Patient Details  Name: Timothy Baker MRN: [id number] Date of Birth: 1960-09-24 Referring Provider (SLP): Dr. Rosalin Hawking   Encounter Date: 06/16/2020   End of Session - 06/16/20 1411    Visit Number 9    Number of Visits 17    Date for SLP Re-Evaluation 07/01/20    Authorization Type BCBS    SLP Start Time 0768    SLP Stop Time  1100    SLP Time Calculation (min) 45 min    Activity Tolerance Patient tolerated treatment well           Past Medical History:  Diagnosis Date  . Polysubstance abuse ([facility])    quit in 2019 - used ETOH, crack cocaine    Past Surgical History:  Procedure Laterality Date  . genital surgery    . LOOP RECORDER INSERTION N/A 05/01/2020   Procedure: LOOP RECORDER INSERTION;  Surgeon: Thompson Grayer, MD;  Location: [facility];  Service: Cardiovascular;  Laterality: N/A;    There were no vitals filed for this visit.   Subjective Assessment - 06/16/20 1017    Subjective "I washed my mom's car outside"    Patient is accompained by: Family member   Barbaraann Rondo   Currently in Pain? No/denies                 ADULT SLP TREATMENT - 06/16/20 1019      General Information   Behavior/Cognition Alert;Impulsive;Pleasant mood;Requires cueing      Treatment Provided   Treatment provided Cognitive-Linquistic      Cognitive-Linquistic Treatment   Treatment focused on Dysarthria;Cognition;Patient/family/caregiver education    Skilled Treatment Pt's brother Barbaraann Rondo present for this session, who reports speech is nearing baseline. Barbaraann Rondo reports pt has slower processing since stroke and increased difficulty with attention. SLP questioned baseline attention difficulties, which was indicated. SLP engaged patient in conversation re: awareness of skills required  for return to job, in which pt able to verbally ID safety scenarios with mod prompting as pt frequently stated same issue in different manner. Emergent awareness improved related to speech errors; however, persistent difficulty with anticipatory awareness exhibited. SLP had pt read aloud for targeted use of compensations, in which pt able ID and self-correct with occasional cues.      Assessment / Recommendations / Plan   Plan Continue with current plan of care      Progression Toward Goals   Progression toward goals Progressing toward goals            SLP Education - 06/16/20 1410    Education Details reading aloud, speech compensations, attention strats    Person(s) Educated Patient;Other (comment)    Methods Explanation;Demonstration;Handout    Comprehension Verbalized understanding;Returned demonstration;Need further instruction            SLP Short Term Goals - 06/09/20 1131      SLP SHORT TERM GOAL #1   Title Pt will complete HEP for dysarthria with occasional min A    Baseline 05-28-20    Period Weeks    Status Partially Met      SLP SHORT TERM GOAL #2   Title Pt will average 70dB on sentence responses 14/15x with occasional min A    Baseline 05-28-20    Period Weeks    Status Achieved      SLP SHORT  TERM GOAL #3   Title Pt will utilize compensations for memory to manage schedule, appointments and medication with occasional min A from family and friends    Baseline 06-02-20    Period Weeks    Status Partially Met      SLP SHORT TERM GOAL #4   Title Pt will verbalize 3 safety concerns in the home and generate solutions to them (emergent awareness) with occasional min A    Baseline 05-28-20    Period Weeks    Status Partially Met      SLP SHORT TERM GOAL #5   Title Pt will be 90% intelligible over 5 minute conversation in mildly noisy environment with occasional min A    Period Weeks    Status Not Met            SLP Long Term Goals - 06/16/20 1034      SLP  LONG TERM GOAL #1   Title Pt will complete HEP for dysarthria with mod I    Time 3    Period Weeks    Status On-going      SLP LONG TERM GOAL #2   Title Pt will average 70dB over 15 minute conversation with rare min A    Time 3    Period Weeks    Status On-going      SLP LONG TERM GOAL #3   Title Pt will verbalize carryover of  anticipatory awareness for 3 safety situations in the home    Time 3    Period Weeks    Status On-going      SLP LONG TERM GOAL #4   Title Pt will be 95% intellgible over 15 minute conversation in noisy environment with rare min A    Time 3    Period Weeks    Status On-going      SLP LONG TERM GOAL #5   Title Score on Communicative Effectivess Survey will increase (family to fill out ) by 4 points.    Baseline 16    Time 3    Period Weeks    Status On-going            Plan - 06/16/20 1412    Clinical Impression Statement "[name]" presents with mild to mod cognitive linguistic impairment and moderate mixed dysarthria c/b imprecise articulation, fast rate of speech, and decreased vocal intensity. Pt's brother confirmed speech in nearing baseline. Reading aloud practice completed, in which pt able to self-correct with improve accuracy this session. SLP assessed emergent and anticipatory awareness, with pt able to verbalize safety scenarios and skills required for return to work with moderate prompting. Cues required given pt frequently repeated same information with different syntax. SLP recommends skilled ST to maximize intelligibility and cognitive communciation skills for safety, to reduce caregiver burden and return to PLOF.    Speech Therapy Frequency 2x / week    Duration 8 weeks   or 17 total visits   Treatment/Interventions Diet toleration management by SLP;Environmental controls;Cueing hierarchy;SLP instruction and feedback;Compensatory strategies;Functional tasks;Cognitive reorganization;Compensatory techniques;Internal/external aids;Multimodal  communcation approach;Patient/family education    Potential to Achieve Goals Good    Potential Considerations Cooperation/participation level;Previous level of function    SLP Home Exercise Plan HEP    Consulted and Agree with Plan of Care Patient;Family member/caregiver           Patient will benefit from skilled therapeutic intervention in order to improve the following deficits and impairments:   Dysarthria and anarthria  Cognitive communication  deficit    Problem List Patient Active Problem List   Diagnosis Date Noted  . 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 Leukocytosis   . Tobacco abuse   . Prediabetes   . Dyslipidemia   . Tobacco dependence due to cigarettes 04/27/2020    Alinda Deem, MA CCC-SLP 06/16/2020, 2:15 PM  [facility] [address] [place], [place], [address] Phone: [phone]   Fax:  [phone]   Name: PASCHAL BLANTON MRN: [id number] Date of Birth: [birth year]

## 2020-06-16 NOTE — Therapy (Signed)
[facility] [address] [place], [place], [address] Phone: [phone]   Fax:  [phone]  Occupational Therapy Treatment  Patient Details  Name: Timothy Baker MRN: [id number] Date of Birth: Sep 11, 1960 Referring Provider (OT): Dr. Rosalin Hawking   Encounter Date: 06/16/2020   OT End of Session - 06/16/20 1150    Visit Number 5    Number of Visits 16    Date for OT Re-Evaluation 07/10/20    Authorization Type BC/BS - VL 30 combined PT/OT (Speech has 30)    Authorization - Visit Number 5    Authorization - Number of Visits 15    OT Start Time 1100    OT Stop Time 1143    OT Time Calculation (min) 43 min    Activity Tolerance Patient tolerated treatment well    Behavior During Therapy Impulsive           Past Medical History:  Diagnosis Date  . Polysubstance abuse ([facility])    quit in 2019 - used ETOH, crack cocaine    Past Surgical History:  Procedure Laterality Date  . genital surgery    . LOOP RECORDER INSERTION N/A 05/01/2020   Procedure: LOOP RECORDER INSERTION;  Surgeon: Thompson Grayer, MD;  Location: [facility];  Service: Cardiovascular;  Laterality: N/A;    There were no vitals filed for this visit.   Subjective Assessment - 06/16/20 1103    Pertinent History CVA 04/27/20 with Lt hemiplegia. PMH: HTN, HLD, substance abuse    Limitations loop recorder 05/01/20, no driving    Currently in Pain? No/denies           Pt issued shoulder HEP - see pt instructions. Pt did require cues to go slowly for control for sh flexion supine, and to activate correct muscle group w/ scapula retraction/depression (vs. Sh hiking)  UBE x 8 min, level 3 while therapist set up environmental scanning task. Pt performed environmental scanning in busy gym finding 100% items - 13/13.  Pt practiced simulating laundry task - placing things in/out of washer and dryer, made [employer] sandwich and washed dishes w/o assist.  However required cues for safety to fully attend to Lt side (to get hand fully on plate to prevent dropping, to pick up Lt foot more, and to fully clear LUE when taking items out of refrigerator)                     OT Education - 06/16/20 1130    Education Details shoulder HEP    Person(s) Educated Patient    Methods Explanation;Demonstration;Handout;Verbal cues    Comprehension Verbalized understanding;Returned demonstration;Verbal cues required            OT Short Term Goals - 06/16/20 1151      OT SHORT TERM GOAL #1   Title Independent with HEP for Lt hand coordination and strength    Time 4    Period Weeks    Status Achieved      OT SHORT TERM GOAL #2   Title Independent with LUE shoulder HEP    Time 4    Period Weeks    Status On-going      OT SHORT TERM GOAL #3   Title Pt to use Lt hand to feed self and groom 50% of the time    Baseline using Rt non dominant hand    Time 4    Period Weeks    Status Partially  Met   100% w/ feeding per pt report, brushing teeth w/ Rt hand     OT SHORT TERM GOAL #4   Title Pt to make snack and perform light IADLS (washing dishes, folding towels) from standing position safely    Time 4    Period Weeks    Status Achieved   Met in clinic - pt also reports sweeping and washing car     OT SHORT TERM GOAL #5   Title Pt to perform LE dressing at mod I level w/ A/E prn    Time 4    Period Weeks    Status Achieved      OT SHORT TERM GOAL #6   Title Pt to perform walker negotiation and environmental scanning through tight spaces w/ 85% accuracy and no bumps on Lt side    Time 4    Period Weeks    Status On-going   found 12/16 using cane, 06/16/20: found 13/13 items (100%)            OT Long Term Goals - 05/12/20 1531      OT LONG TERM GOAL #1   Title Pt to improve coordination Lt hand as evidenced by performing 9 hole peg test in under 40 sec    Baseline 49.44 sec    Time 8    Period Weeks    Status New       OT LONG TERM GOAL #2   Title Pt to improve grip strength Lt hand to 80 lbs or greater    Baseline 72 lbs (Rt = 81)    Time 8    Period Weeks    Status New      OT LONG TERM GOAL #3   Title Pt to return to simple cooking tasks and IADLS safely w/ DME/AE prn in prep for return to living alone    Time 8    Period Weeks    Status New      OT LONG TERM GOAL #4   Title Independent with memory compensatory strategies for medication management and paying bills    Time 8    Period Weeks    Status New      OT LONG TERM GOAL #5   Title Pt to use Lt dominant hand 90% for eating and grooming tasks    Time 8    Period Weeks    Status New                 Plan - 06/16/20 1152    Clinical Impression Statement Pt has improved with all functional tasks. Pt still demo overall decreased attention (? how much of this was premorbid) and mild Lt body inattention w/ decreased awareness.    OT Occupational Profile and History Detailed Assessment- Review of Records and additional review of physical, cognitive, psychosocial history related to current functional performance    Occupational performance deficits (Please refer to evaluation for details): ADL's;IADL's;Work    Body Structure / Function / Physical Skills ADL;Strength;Dexterity;Balance;Body mechanics;Proprioception;UE functional use;IADL;ROM;Endurance;Coordination;Mobility;FMC;Decreased knowledge of precautions    Cognitive Skills Attention;Safety Awareness;Memory;Perception    Rehab Potential Good    Clinical Decision Making Several treatment options, min-mod task modification necessary    Comorbidities Affecting Occupational Performance: May have comorbidities impacting occupational performance    Modification or Assistance to Complete Evaluation  Min-Moderate modification of tasks or assist with assess necessary to complete eval    OT Frequency 2x / week    OT Duration 8  weeks    OT Treatment/Interventions Self-care/ADL training;DME  and/or AE instruction;Therapeutic activities;Aquatic Therapy;Therapeutic exercise;Cognitive remediation/compensation;Coping strategies training;Neuromuscular education;Functional Mobility Training;Passive range of motion;Visual/perceptual remediation/compensation;Patient/family education;Manual Therapy;Moist Heat    Plan continue progress towards goals, work on attention during functional tasks    Consulted and Agree with Plan of Care Patient           Patient will benefit from skilled therapeutic intervention in order to improve the following deficits and impairments:   Body Structure / Function / Physical Skills: ADL,Strength,Dexterity,Balance,Body mechanics,Proprioception,UE functional use,IADL,ROM,Endurance,Coordination,Mobility,FMC,Decreased knowledge of precautions Cognitive Skills: Attention,Safety Awareness,Memory,Perception     Visit Diagnosis: Hemiplegia and hemiparesis following cerebral infarction affecting left dominant side (HCC)  Muscle weakness (generalized)  Neurologic neglect syndrome    Problem List Patient Active Problem List   Diagnosis Date Noted  . 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 Leukocytosis   . Tobacco abuse   . Prediabetes   . Dyslipidemia   . Tobacco dependence due to cigarettes 04/27/2020    Carey Bullocks, OTR/L 06/16/2020, 11:54 AM  [facility] [address] [place], [place], [address] Phone: [phone]   Fax:  [phone]  Name: Timothy Baker MRN: [id number] Date of Birth: [birth year]

## 2020-06-16 NOTE — Therapy (Signed)
[facility] [address] [place], [place], [address] Phone: [phone]   Fax:  [phone]  Physical Therapy Treatment  Patient Details  Name: Timothy Baker MRN: [id number] Date of Birth: 1960-07-24 Referring Provider (PT): Seward Carol MD   Encounter Date: 06/16/2020   PT End of Session - 06/16/20 1201    Visit Number 9    Number of Visits 17    Date for PT Re-Evaluation 06/04/20    Authorization Type BCBS    PT Start Time 1145    PT Stop Time 1230    PT Time Calculation (min) 45 min    Equipment Utilized During Treatment Gait belt    Activity Tolerance Patient tolerated treatment well    Behavior During Therapy Impulsive           Past Medical History:  Diagnosis Date  . Polysubstance abuse ([place])    quit in 2019 - used ETOH, crack cocaine    Past Surgical History:  Procedure Laterality Date  . genital surgery    . LOOP RECORDER INSERTION N/A 05/01/2020   Procedure: LOOP RECORDER INSERTION;  Surgeon: Thompson Grayer, MD;  Location: [facility];  Service: Cardiovascular;  Laterality: N/A;    There were no vitals filed for this visit.   Subjective Assessment - 06/16/20 1222    Subjective Low back pain is less overall, reports pivoting on his L foot is problematic    Pertinent History 60 yo male with onset of L side weakness sustained a fall climbing up to change smoke detector battery on 2/5, then on 2/6 came to ED with significant changes in his L side facially and in L extremities.  On imaging his notes were that the infarction was in R anterior limb of internal capsule.  Pt has memory changes, coordination changes and impulsive behavior.  PMHx:  substance abuse, HTN, HLD, tobacco use    Limitations Standing;Walking    How long can you sit comfortably? unlimited    How long can you stand comfortably? 5-10'    How long can you walk comfortably? 5-10'    Patient Stated Goals To return to I  ambulation and mobility    Currently in Pain? No/denies    Pain Onset In the past 7 days                             [facility] Adult PT Treatment/Exercise - 06/16/20 0001      Transfers   Transfers Sit to Stand    Sit to Stand 5: Supervision    Sit to Stand Details Tactile cues for initiation    Sit to Stand Details (indicate cue type and reason) form airex      Knee/Hip Exercises: Aerobic   Nustep 8' L4 arms 12      Knee/Hip Exercises: Standing   Terminal Knee Extension Strengthening;Left;3 sets;15 reps    Theraband Level (Terminal Knee Extension) Level 4 (Blue)    Terminal Knee Extension Limitations standing with cane support    Forward Step Up Both;1 set;15 reps    Forward Step Up Limitations solid surface to 6" block with foam 15x per side    Step Down Both;15 reps;Hand Hold: 1    Step Down Limitations performed from low profile rocker board    Other Standing Knee Exercises Runner step from airex in // bars 15x ea. side  Balance Exercises - 06/16/20 0001      Balance Exercises: Standing   Stepping Strategy Anterior;UE support;Limitations    Stepping Strategy Limitations taps onto cushioned 6" step 15x per side    Other Standing Exercises Heel taps from low profile rocker board 15 per side             PT Education - 06/16/20 1209    Education Details [email or web address]    Person(s) Educated Patient    Methods Explanation;Demonstration;Tactile cues;Handout    Comprehension Verbalized understanding;Returned demonstration            PT Short Term Goals - 05/19/20 1821      PT SHORT TERM GOAL #1   Title patient to tranfer STS under S with no physical assist    Baseline Min-Mod A to transfer STS; 05/19/20 able to STS transfer under S only    Time 2    Period Weeks    Status Achieved    Target Date 05/19/20      PT SHORT TERM GOAL #2   Title Patient to demo established HEP follwing development of written exercises based on patients  abilities and deficits    Baseline no HEP; verbally reviewed exercises    Time 2    Period Weeks    Status Partially Met    Target Date 05/19/20      PT SHORT TERM GOAL #3   Title Patient to ambulate 157f x2 with RW and close S of PT    Baseline 1172fwith rollator and Min A of PT; ambulation of 11561fith SPC and CGA    Time 2    Period Weeks    Status Achieved    Target Date 05/19/20      PT SHORT TERM GOAL #4   Title Assess DGI and MCTSIB and establish goals    Baseline not tested    Time 2    Period Days    Status On-going    Target Date 05/19/20             PT Long Term Goals - 05/05/20 1135      PT LONG TERM GOAL #1   Title Ambulation of 500f53fth LRAD and S of PT across level ground    Baseline 115ft47fh RW and MinA of PT across level surface    Time 8    Period Weeks    Status New    Target Date 06/30/20      PT LONG TERM GOAL #2   Title Patient to increase LLE strength to 4/5 grossly throughout    Baseline 3-3+/5 grossly throughout    Time 8    Period Weeks    Target Date 06/30/20      PT LONG TERM GOAL #3   Title Patient to transfer and mobilize from all surfaces with ony distant S    Baseline patient transfers STS with Min A/CGA and CGA for bed mobility    Time 8    Period Weeks    Status New    Target Date 06/30/20      PT LONG TERM GOAL #4   Title Assess progress towards DGI and MCTSIB goals    Baseline not set    Time 8    Period Weeks    Status New    Target Date 06/30/20                 Plan - 06/16/20 1303    Clinical Impression  Statement difficulty remains with placement of L foot during gait and stepping tasks, also noting some problems when pivoting on L foot, decreased TKE noted on L during heel strike but no LOB observed, distarcted asily, despite deficits he functions well in clinic, continued need of tasks focused on L foot placement and pivoting on LLE    Personal Factors and Comorbidities Comorbidity 3+     Comorbidities HTN, HLD, CVA    Examination-Activity Limitations Stand;Stairs    Examination-Participation Restrictions Occupation    Stability/Clinical Decision Making Evolving/Moderate complexity    Rehab Potential Good    PT Frequency 2x / week    PT Duration 8 weeks    PT Treatment/Interventions DME Instruction;Gait training;Stair training;Functional mobility training;Therapeutic activities;Therapeutic exercise;Balance training;Neuromuscular re-education;Cognitive remediation;Patient/family education;Orthotic Fit/Training;Dry needling    PT Next Visit Plan 10th visit progress note, assess STGs    PT Home Exercise Plan issued TKE [email or web address]    Consulted and Agree with Plan of Care Patient           Patient will benefit from skilled therapeutic intervention in order to improve the following deficits and impairments:  Abnormal gait,Decreased balance,Decreased endurance,Decreased mobility,Difficulty walking,Decreased cognition,Decreased knowledge of precautions,Impaired perceived functional ability,Decreased activity tolerance,Decreased coordination,Decreased safety awareness,Decreased strength  Visit Diagnosis: Unsteadiness on feet  Muscle weakness (generalized)     Problem List Patient Active Problem List   Diagnosis Date Noted  . 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 Leukocytosis   . Tobacco abuse   . Prediabetes   . Dyslipidemia   . Tobacco dependence due to cigarettes 04/27/2020    Jacqulynn Cadet Shandria Clinch PT 06/16/2020, 1:10 PM  [facility] [address] [place], [place], [address] Phone: [phone]   Fax:  [phone]  Name: MOHMED FARVER MRN: [id number] Date of Birth: 1960-08-03

## 2020-06-16 NOTE — Progress Notes (Signed)
[facility] Summary Report / Loop Recorder 

## 2020-06-16 NOTE — Patient Instructions (Addendum)
  Tips to help facilitate better attention, concentration, focus   Do harder, longer tasks when you are most alert/awake  Break down larger tasks into small parts  Limit distractions of TV, radio, conversation, e mails/texts, appliance noise, etc - if a job is important, do it in a quiet room  Be aware of how you are functioning in high stimulation environments such as large stores, parties, restaurants - any place with lots of lights, noise, signs etc  Group conversations may be more difficult to process than one on one conversations  Give yourself extra time to process conversation, reading materials, directions or information from your healthcare providers  Organization is key - clutters of laundry, mail, paperwork, dirty dishes - all make it more difficult to concentrate  Before you start a task, have all the needed supplies, directions, recipes ready and organized. This way you don't have to go looking for something in the middle of a task and become distracted.   Be aware of fatigue - take rests or breaks when needed to re-group and re-focus

## 2020-06-16 NOTE — Patient Instructions (Signed)
Access Code: [email or web address] URL: [email or web address] Date: 06/16/2020 Prepared by: Gustavus Bryant  Exercises Standing Terminal Knee Extension with Resistance - 1 x daily - 7 x weekly - 3 sets - 10 reps

## 2020-06-22 ENCOUNTER — Ambulatory Visit: Payer: BC Managed Care – PPO | Attending: Internal Medicine

## 2020-06-22 ENCOUNTER — Other Ambulatory Visit: Payer: Self-pay

## 2020-06-22 ENCOUNTER — Ambulatory Visit: Payer: BC Managed Care – PPO | Admitting: Occupational Therapy

## 2020-06-22 ENCOUNTER — Ambulatory Visit: Payer: BC Managed Care – PPO | Admitting: Speech Pathology

## 2020-06-22 DIAGNOSIS — R471 Dysarthria and anarthria: Secondary | ICD-10-CM | POA: Insufficient documentation

## 2020-06-22 DIAGNOSIS — I6389 Other cerebral infarction: Secondary | ICD-10-CM | POA: Insufficient documentation

## 2020-06-22 DIAGNOSIS — I69352 Hemiplegia and hemiparesis following cerebral infarction affecting left dominant side: Secondary | ICD-10-CM | POA: Diagnosis present

## 2020-06-22 DIAGNOSIS — I63411 Cerebral infarction due to embolism of right middle cerebral artery: Secondary | ICD-10-CM | POA: Insufficient documentation

## 2020-06-22 DIAGNOSIS — R2681 Unsteadiness on feet: Secondary | ICD-10-CM | POA: Diagnosis not present

## 2020-06-22 DIAGNOSIS — M6281 Muscle weakness (generalized): Secondary | ICD-10-CM | POA: Insufficient documentation

## 2020-06-22 DIAGNOSIS — R278 Other lack of coordination: Secondary | ICD-10-CM | POA: Insufficient documentation

## 2020-06-22 DIAGNOSIS — R41841 Cognitive communication deficit: Secondary | ICD-10-CM | POA: Diagnosis present

## 2020-06-22 NOTE — Therapy (Signed)
[facility] [address] [place], [place], [address] Phone: [phone]   Fax:  [phone]  Speech Language Pathology Treatment  Patient Details  Name: Timothy Baker MRN: [id number] Date of Birth: [birth year] Referring Provider (SLP): Dr. Rosalin Hawking   Encounter Date: 06/22/2020   End of Session - 06/22/20 1155    Visit Number 10    Number of Visits 17    Date for SLP Re-Evaluation 07/01/20    Authorization Type BCBS    SLP Start Time 1105    SLP Stop Time  1145    SLP Time Calculation (min) 40 min    Activity Tolerance Patient tolerated treatment well           Past Medical History:  Diagnosis Date  . Polysubstance abuse ([place])    quit in 2019 - used ETOH, crack cocaine    Past Surgical History:  Procedure Laterality Date  . genital surgery    . LOOP RECORDER INSERTION N/A 05/01/2020   Procedure: LOOP RECORDER INSERTION;  Surgeon: Thompson Grayer, MD;  Location: [facility];  Service: Cardiovascular;  Laterality: N/A;    There were no vitals filed for this visit.   Subjective Assessment - 06/22/20 1111    Subjective "I do it myself"    Currently in Pain? No/denies                 ADULT SLP TREATMENT - 06/22/20 1112      General Information   Behavior/Cognition Alert;Impulsive;Pleasant mood;Requires cueing      Treatment Provided   Treatment provided Cognitive-Linquistic      Cognitive-Linquistic Treatment   Treatment focused on Dysarthria;Cognition;Patient/family/caregiver education    Skilled Treatment Pt enters room with average 67dB. Loud /a/ to recalibrate volume with average 90dB. with occasional min a for breath support. Timothy Baker continues to require frequent  verbal cues and modeling for volume and over articulation.to be intelligible in simple conversation. In oral reading task, Timothy Baker averaged 72dB with rare min A. In structured speech task generating 3-4 sentence descriptions  he averaged 70dB with frequent mod verbal cues, modeling, and visual cues. Timothy Baker reports      Assessment / Recommendations / Plan   Plan Goals updated      Progression Toward Goals   Progression toward goals Goals downgraded            SLP Education - 06/22/20 1151    Education Details cognitive activities to do at home, compensations for dysarthria    Person(s) Educated Patient    Methods Explanation;Demonstration;Verbal cues    Comprehension Verbalized understanding;Returned demonstration;Verbal cues required;Need further instruction          Speech Therapy Progress Note  Dates of Reporting Period: 05/06/20 to 06/22/20  Objective Reports of Subjective Statement: Timothy Baker continues to endorse he has difficulty being heard and is asked to repeat himself often. He is still living with his mother. He demonstrates reduced anticipatory safety awareness and reduced awareness of low volume.  Objective Measurements: Timothy Baker averages below 70dB in simple conversation affecting intelligibility.   Goal Update: Goals downgraded today - see goals below  Plan: continue POC  Reason Skilled Services are Required: Timothy Baker's goal is to move back home and live independently. He continues to required skilled ST to maximize intelligibility and cognition to safely live independently       SLP Short Term Goals - 06/22/20 [place] #1   Title  Pt will complete HEP for dysarthria with occasional min A    Baseline 05-28-20    Period Weeks    Status Partially Met      SLP SHORT TERM GOAL #2   Title Pt will average 70dB on sentence responses 14/15x with occasional min A    Baseline 05-28-20    Period Weeks    Status Achieved      SLP SHORT TERM GOAL #3   Title Pt will utilize compensations for memory to manage schedule, appointments and medication with occasional min A from family and friends    Baseline 06-02-20    Period Weeks    Status Partially Met      SLP SHORT TERM  GOAL #4   Title Pt will verbalize 3 safety concerns in the home and generate solutions to them (emergent awareness) with occasional min A    Baseline 05-28-20    Period Weeks    Status Partially Met      SLP SHORT TERM GOAL #5   Title Pt will be 90% intelligible over 5 minute conversation in mildly noisy environment with occasional min A    Period Weeks    Status Not Met            SLP Long Term Goals - 06/22/20 1155      SLP LONG TERM GOAL #1   Title Pt will complete HEP for dysarthria with occasional min A    Time 2    Period Weeks    Status Revised      SLP LONG TERM GOAL #2   Title Pt will average 70dB over 3 sentence utterances with occasional min A    Time 2    Period Weeks    Status Revised      SLP LONG TERM GOAL #3   Title Pt will verbalize carryover of  anticipatory awareness for 3 safety situations in the home    Time 2    Period Weeks    Status On-going      SLP LONG TERM GOAL #4   Title Pt will be 85% intellgible over 3 sentence utterance with occasional min A    Time 2    Period Weeks    Status On-going      SLP LONG TERM GOAL #5   Title Score on Communicative Effectivess Survey will increase (family to fill out ) by 2 points.    Baseline 16    Time 3    Period Weeks    Status Revised            Plan - 06/22/20 1154    Clinical Impression Statement "Timothy Baker" presents with mild to mod cognitive linguistic impairment and moderate mixed dysarthria c/b imprecise articulation, fast rate of speech, and decreased vocal intensity. Pt's brother confirmed speech in nearing baseline. Reading aloud practice completed, in which pt able to self-correct with improve accuracy this session. SLP assessed emergent and anticipatory awareness, with pt able to verbalize safety scenarios and skills required for return to work with moderate prompting. Cues required given pt frequently repeated same information with different syntax. SLP recommends skilled ST to maximize  intelligibility and cognitive communciation skills for safety, to reduce caregiver burden and return to PLOF.    Speech Therapy Frequency 2x / week    Duration 8 weeks   17 visits   Treatment/Interventions Diet toleration management by SLP;Environmental controls;Cueing hierarchy;SLP instruction and feedback;Compensatory strategies;Functional tasks;Cognitive reorganization;Compensatory techniques;Internal/external aids;Multimodal communcation approach;Patient/family education  Potential to Achieve Goals Good    Potential Considerations Cooperation/participation level;Previous level of function           Patient will benefit from skilled therapeutic intervention in order to improve the following deficits and impairments:   Dysarthria and anarthria  Cognitive communication deficit    Problem List Patient Active Problem List   Diagnosis Date Noted  . 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 Leukocytosis   . Tobacco abuse   . Prediabetes   . Dyslipidemia   . Tobacco dependence due to cigarettes 04/27/2020    Maiana Hennigan, Annye Rusk  MS, CCC-SLP 06/22/2020, 12:04 PM  [facility] [address] [place], [place], [address] Phone: [phone]   Fax:  [phone]   Name: OSSIE BELTRAN MRN: [id number] Date of Birth: [birth year]

## 2020-06-22 NOTE — Therapy (Signed)
[facility] [address] [place], [place], [address] Phone: [phone]   Fax:  [phone]  Physical Therapy Treatment  Patient Details  Name: TRENTON PASSOW MRN: [id number] Date of Birth: 18-May-1960 Referring Provider (PT): Seward Carol MD   Encounter Date: 06/22/2020   PT End of Session - 06/22/20 1148    Visit Number 10    Number of Visits 17    Date for PT Re-Evaluation 06/04/20    Authorization Type BCBS    Progress Note Due on Visit 10    PT Start Time 6294    PT Stop Time 1230    PT Time Calculation (min) 45 min    Equipment Utilized During Treatment Gait belt    Activity Tolerance Patient tolerated treatment well    Behavior During Therapy Impulsive           Past Medical History:  Diagnosis Date  . Polysubstance abuse ([place])    quit in 2019 - used ETOH, crack cocaine    Past Surgical History:  Procedure Laterality Date  . genital surgery    . LOOP RECORDER INSERTION N/A 05/01/2020   Procedure: LOOP RECORDER INSERTION;  Surgeon: Thompson Grayer, MD;  Location: [facility];  Service: Cardiovascular;  Laterality: N/A;    There were no vitals filed for this visit.   Subjective Assessment - 06/22/20 1631    Subjective No back pain or med changes to note, did sustain a fall ambulating across grass with cane, no injuries reported and able to get back on his feet I    Patient is accompained by: Family member   sister Webb Silversmith   Pertinent History 60 yo male with onset of L side weakness sustained a fall climbing up to change smoke detector battery on 2/5, then on 2/6 came to ED with significant changes in his L side facially and in L extremities.  On imaging his notes were that the infarction was in R anterior limb of internal capsule.  Pt has memory changes, coordination changes and impulsive behavior.  PMHx:  substance abuse, HTN, HLD, tobacco use    Limitations Standing;Walking    How long can you  sit comfortably? unlimited    How long can you stand comfortably? 5-10'    How long can you walk comfortably? 5-10'    Patient Stated Goals To return to I ambulation and mobility    Pain Onset In the past 7 days              [facility] PT Assessment - 06/22/20 0001      Functional Gait  Assessment   Gait assessed  Yes    Gait Level Surface Walks 20 ft in less than 7 sec but greater than 5.5 sec, uses assistive device, slower speed, mild gait deviations, or deviates 6-10 in outside of the 12 in walkway width.    Change in Gait Speed Makes only minor adjustments to walking speed, or accomplishes a change in speed with significant gait deviations, deviates 10-15 in outside the 12 in walkway width, or changes speed but loses balance but is able to recover and continue walking.    Gait with Horizontal Head Turns Performs head turns smoothly with no change in gait. Deviates no more than 6 in outside 12 in walkway width    Gait with Vertical Head Turns Performs task with slight change in gait velocity (eg, minor disruption to smooth gait path), deviates 6 - 10 in outside 12  in walkway width or uses assistive device    Gait and Pivot Turn Pivot turns safely within 3 sec and stops quickly with no loss of balance.    Step Over Obstacle Is able to step over 2 stacked shoe boxes taped together (9 in total height) without changing gait speed. No evidence of imbalance.    Gait with Narrow Base of Support Ambulates 7-9 steps.    Gait with Eyes Closed Walks 20 ft, uses assistive device, slower speed, mild gait deviations, deviates 6-10 in outside 12 in walkway width. Ambulates 20 ft in less than 9 sec but greater than 7 sec.    Ambulating Backwards Walks 20 ft, uses assistive device, slower speed, mild gait deviations, deviates 6-10 in outside 12 in walkway width.    Steps Alternating feet, must use rail.    Total Score 22    FGA comment: used cane                         [facility] Adult PT  Treatment/Exercise - 06/22/20 0001      Knee/Hip Exercises: Aerobic   Nustep 8' L4 arms 12               Balance Exercises - 06/22/20 0001      Balance Exercises: Standing   Stepping Strategy Anterior;Limitations    Stepping Strategy Limitations 15 taps with LLE onto cone with 3# wt at counter    Marching Foam/compliant surface;Upper extremity assist 1;15 reps;Limitations    Marching Limitations riunner step using Airex, 3# wt LLE    Other Standing Exercises heel taps from high profile rocker board, 15x per side    Other Standing Exercises Comments squatting from low profile rockerboard               PT Short Term Goals - 06/22/20 1149      PT SHORT TERM GOAL #1   Title patient to tranfer STS under S with no physical assist    Baseline Min-Mod A to transfer STS; 05/19/20 able to STS transfer under S only; 06/22/20 Able to perform STS w/o need of UE assist    Time 2    Period Weeks    Status Achieved    Target Date 05/19/20      PT SHORT TERM GOAL #2   Title Patient to demo established HEP follwing development of written exercises based on patients abilities and deficits    Baseline no HEP; verbally reviewed exercises    Time 2    Period Weeks    Status Partially Met    Target Date 05/19/20      PT SHORT TERM GOAL #3   Title Patient to ambulate 121f x2 with RW and close S of PT    Baseline 1159fwith rollator and Min A of PT; ambulation of 11559fith SPC and CGA; 06/22/20 Able to ambulate 1000f76fth SPC and SBA/S    Time 2    Period Weeks    Status Achieved    Target Date 05/19/20      PT SHORT TERM GOAL #4   Title Assess DGI and MCTSIB and establish goals    Baseline not tested; MCTSIB able to hold 30s at all positions    Time 2    Period Days    Status Achieved    Target Date 05/19/20             PT Long Term Goals - 06/22/20 [id number]  PT LONG TERM GOAL #1   Title Ambulation of 1042f with LRAD and S of PT across level ground    Baseline 1161fwith  RW and MinA of PT across level surface    Time 8    Period Weeks    Status Revised      PT LONG TERM GOAL #2   Title Patient to increase LLE strength to 4/5 grossly throughout    Baseline 3-3+/5 grossly throughout    Time 8    Period Weeks      PT LONG TERM GOAL #3   Title Patient to transfer and mobilize from all surfaces with ony distant S    Baseline patient transfers STS with Min A/CGA and CGA for bed mobility    Time 8    Period Weeks    Status New      PT LONG TERM GOAL #4   Title Assess progress towards DGI and MCTSIB goals    Baseline not set; 06/22/20 FGA goal is 26/30, no CTSIB goal needed    Time 8    Period Weeks    Status New                 Plan - 06/22/20 1635    Clinical Impression Statement Todays skilled session centered on progress made towards goals and communication to MD regarding that progress, patient has made gains in strength, mobility and function, he is now more stable on his feet and his fall risk is reduced and patient is more I in his mobility status, weakness remains in LLE especially in hip flexion, he has now advanced to a cane for ambulation    Personal Factors and Comorbidities Comorbidity 3+    Comorbidities HTN, HLD, CVA    Examination-Activity Limitations Stand;Stairs    Examination-Participation Restrictions Occupation    Stability/Clinical Decision Making Evolving/Moderate complexity    Rehab Potential Good    PT Frequency 2x / week    PT Duration 8 weeks    PT Treatment/Interventions DME Instruction;Gait training;Stair training;Functional mobility training;Therapeutic activities;Therapeutic exercise;Balance training;Neuromuscular re-education;Cognitive remediation;Patient/family education;Orthotic Fit/Training;Dry needling    PT Next Visit Plan continue to address hip and knee flexion strength deficits, focus on L foot placement and swing through clearance    PT Home Exercise Plan issued TKE [id number]    Consulted and Agree with  Plan of Care Patient           Patient will benefit from skilled therapeutic intervention in order to improve the following deficits and impairments:  Abnormal gait,Decreased balance,Decreased endurance,Decreased mobility,Difficulty walking,Decreased cognition,Decreased knowledge of precautions,Impaired perceived functional ability,Decreased activity tolerance,Decreased coordination,Decreased safety awareness,Decreased strength  Visit Diagnosis: Unsteadiness on feet  Muscle weakness (generalized)  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 Leukocytosis   . Tobacco abuse   . Prediabetes   . Dyslipidemia   . Tobacco dependence due to cigarettes 04/27/2020    JeLanice Shirts/06/2020, 4:43 PM  [place]hone: [phone] Fax:  [phone]: FrYAMA NIELSONRN: [id number] of Birth: 7/09-Jul-1960

## 2020-06-22 NOTE — Patient Instructions (Signed)
   When you are asked to repeat yourself, take a big breath and think "shout"  Loud AH! 5x times twice a day as loud as you can, as long as you can  Taking a big breath gives your voice power   Do cognitive activities instead of watching TV:    Checkers Chess Connect 4 [employer] games Jig saw puzzles Easy cross words Memory match Board games [employer]ong Learn a new game!  Listen to and discuss Ted Talks or Podcasts Read and discuss short articles of interest to you- Take notes on these if memory is a challenge Discuss social media posts Look and discuss photo albums  The best activities to improve cognition are functional, real life activities that are important to you:  Plan a menu Participate in household chores and decisions (with supervision) Participate in managing finances Plan a party, trip or tailgate with all of the details (even if you aren't really going to carry it out) Participate in your hobby as you are able with assistance Manage your texts, emails with supervision if needed. Google search for items (even if you're not really going to buy anything) and compare prices and features Socialize -  however, too many visitors can be overwhelming, so set limits "My doctor said I should only visit (or talk) for 20 minutes" or "I do better when I visit with just 1-2 people at a time for 20 minutes"    It's good to use real in-person games, not just apps  Apps:  [employer] Elevate There are apps for most of the games listed above    Get the persons attention before you speak  Use eye contact and face the person you are speaking to  Be in close proximity to the person you are speaking to  Turn down any noise in the environment such as the TV, walk away from loud appliances, air conditioners, fans, dish washers etc

## 2020-06-24 ENCOUNTER — Other Ambulatory Visit: Payer: Self-pay

## 2020-06-24 ENCOUNTER — Encounter: Payer: Self-pay | Admitting: Speech Pathology

## 2020-06-24 ENCOUNTER — Ambulatory Visit: Payer: BC Managed Care – PPO | Admitting: Speech Pathology

## 2020-06-24 ENCOUNTER — Ambulatory Visit: Payer: BC Managed Care – PPO | Admitting: Occupational Therapy

## 2020-06-24 ENCOUNTER — Ambulatory Visit: Payer: BC Managed Care – PPO

## 2020-06-24 DIAGNOSIS — R41841 Cognitive communication deficit: Secondary | ICD-10-CM

## 2020-06-24 DIAGNOSIS — R2681 Unsteadiness on feet: Secondary | ICD-10-CM

## 2020-06-24 DIAGNOSIS — R278 Other lack of coordination: Secondary | ICD-10-CM

## 2020-06-24 DIAGNOSIS — I69352 Hemiplegia and hemiparesis following cerebral infarction affecting left dominant side: Secondary | ICD-10-CM

## 2020-06-24 DIAGNOSIS — M6281 Muscle weakness (generalized): Secondary | ICD-10-CM

## 2020-06-24 DIAGNOSIS — R471 Dysarthria and anarthria: Secondary | ICD-10-CM

## 2020-06-24 NOTE — Patient Instructions (Signed)
Memory Compensation Strategies  1. Use "WARM" strategy. W= write it down A=  associate it R=  repeat it M=  make a mental picture  2. You can keep a Memory Notebook. Use a 3-ring notebook with sections for the following:  calendar, important names and phone numbers, medications, doctors' names/phone numbers, "to do list"/reminders, and a section to journal what you did each day  3. Use a calendar to write appointments down.  4. Write yourself a schedule for the day.  This can be placed on the calendar or in a separate section of the Memory Notebook.  Keeping a regular schedule can help memory.  5. Use medication organizer with sections for each day or morning/evening pills  You may need help loading it  6. Keep a basket, or pegboard by the door.   Place items that you need to take out with you in the basket or on the pegboard.  You may also want to include a message board for reminders.  7. Use sticky notes. Place sticky notes with reminders in a place where the task is performed.  For example:  "turn off the stove" placed by the stove, "lock the door" placed on the door at eye level, "take your medications" on the bathroom mirror or by the place where you normally take your medications  8. Use alarms/timers.  Use while cooking to remind yourself to check on food or as a reminder to take your medicine, or as a reminder to make a call, or as a reminder to perform another task, etc.  9. Use a voice recorder app or small tape recorder to record important information and notes for yourself. Go back at the end of the day and listen to these.  

## 2020-06-24 NOTE — Therapy (Signed)
[facility] [address] [place], [place], [address] Phone: [phone]   Fax:  [phone]  Speech Language Pathology Treatment  Patient Details  Name: Timothy Baker MRN: [id number] Date of Birth: 06-23-1960 Referring Provider (SLP): Dr. Rosalin Hawking   Encounter Date: 06/24/2020   End of Session - 06/24/20 1204    Visit Number 11    Number of Visits 17    Date for SLP Re-Evaluation 07/01/20    Authorization Type BCBS    SLP Start Time 1103    SLP Stop Time  1144    SLP Time Calculation (min) 41 min    Activity Tolerance Patient tolerated treatment well           Past Medical History:  Diagnosis Date  . Polysubstance abuse ([place])    quit in 2019 - used ETOH, crack cocaine    Past Surgical History:  Procedure Laterality Date  . genital surgery    . LOOP RECORDER INSERTION N/A 05/01/2020   Procedure: LOOP RECORDER INSERTION;  Surgeon: Thompson Grayer, MD;  Location: [facility];  Service: Cardiovascular;  Laterality: N/A;    There were no vitals filed for this visit.   Subjective Assessment - 06/24/20 1110    Currently in Pain? No/denies                 ADULT SLP TREATMENT - 06/24/20 1110      General Information   Behavior/Cognition Alert;Impulsive;Pleasant mood;Requires cueing      Treatment Provided   Treatment provided Cognitive-Linquistic      Cognitive-Linquistic Treatment   Treatment focused on Cognition;Dysarthria;Patient/family/caregiver education    Skilled Treatment As Timothy Baker's goal is to move back home with some famility help, we focused on tasks he needs to get back home. Today, menu planning/shopping list. Targeted word finding/thought organization generating grocery list for 1 week of food (lunch, dinners, drinks) with usual  min quetioning cues to generate foods he eats regularly. Consistent spelling errors, Timothy Baker required conistent max A to ID and correct (by copying).  Timothy Baker reports he has "fogotten how to spell." He continues to rerquire frequent mod to max A for volume and overarticulation in conversation.      Assessment / Recommendations / Plan   Plan Continue with current plan of care      Progression Toward Goals   Progression toward goals Progressing toward goals              SLP Short Term Goals - 06/24/20 1204      SLP SHORT TERM GOAL #1   Title Pt will complete HEP for dysarthria with occasional min A    Baseline 05-28-20    Period Weeks    Status Partially Met      SLP SHORT TERM GOAL #2   Title Pt will average 70dB on sentence responses 14/15x with occasional min A    Baseline 05-28-20    Period Weeks    Status Achieved      SLP SHORT TERM GOAL #3   Title Pt will utilize compensations for memory to manage schedule, appointments and medication with occasional min A from family and friends    Baseline 06-02-20    Period Weeks    Status Partially Met      SLP SHORT TERM GOAL #4   Title Pt will verbalize 3 safety concerns in the home and generate solutions to them (emergent awareness) with occasional min A    Baseline 05-28-20  Period Weeks    Status Partially Met      SLP SHORT TERM GOAL #5   Title Pt will be 90% intelligible over 5 minute conversation in mildly noisy environment with occasional min A    Period Weeks    Status Not Met            SLP Long Term Goals - 06/24/20 1204      SLP LONG TERM GOAL #1   Title Pt will complete HEP for dysarthria with occasional min A    Time 2    Period Weeks    Status Revised      SLP LONG TERM GOAL #2   Title Pt will average 70dB over 3 sentence utterances with occasional min A    Time 2    Period Weeks    Status Revised      SLP LONG TERM GOAL #3   Title Pt will verbalize carryover of  anticipatory awareness for 3 safety situations in the home    Time 2    Period Weeks    Status On-going      SLP LONG TERM GOAL #4   Title Pt will be 85% intellgible over 3  sentence utterance with occasional min A    Time 2    Period Weeks    Status On-going      SLP LONG TERM GOAL #5   Title Score on Communicative Effectivess Survey will increase (family to fill out ) by 2 points.    Baseline 16    Time 3    Period Weeks    Status Revised            Plan - 06/24/20 1203    Clinical Impression Statement "Timothy Baker" presents with mild to mod cognitive linguistic impairment and moderate mixed dysarthria c/b imprecise articulation, fast rate of speech, and decreased vocal intensity. Pt's brother confirmed speech in nearing baseline. Reading aloud practice completed, in which pt able to self-correct with improve accuracy this session. SLP assessed emergent and anticipatory awareness, with pt able to verbalize safety scenarios and skills required for return to work with moderate prompting. Cues required given pt frequently repeated same information with different syntax. SLP recommends skilled ST to maximize intelligibility and cognitive communciation skills for safety, to reduce caregiver burden and return to PLOF.    Speech Therapy Frequency 2x / week    Duration 8 weeks   17 visits   Treatment/Interventions Diet toleration management by SLP;Environmental controls;Cueing hierarchy;SLP instruction and feedback;Compensatory strategies;Functional tasks;Cognitive reorganization;Compensatory techniques;Internal/external aids;Multimodal communcation approach;Patient/family education    Potential to Achieve Goals Fair    Potential Considerations Cooperation/participation level;Previous level of function           Patient will benefit from skilled therapeutic intervention in order to improve the following deficits and impairments:   Dysarthria and anarthria  Cognitive communication deficit    Problem List Patient Active Problem List   Diagnosis Date Noted  . 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 Leukocytosis   . Tobacco abuse   .  Prediabetes   . Dyslipidemia   . Tobacco dependence due to cigarettes 04/27/2020    Lovvorn, Annye Rusk MS, [place] 06/24/2020, 12:05 PM  [facility] [address] [place], [place], [address] Phone: [phone]   Fax:  [phone]   Name: Timothy Baker MRN: [id number] Date of Birth: 11/21/1960

## 2020-06-24 NOTE — Therapy (Signed)
[facility] [address] [place], [place], [address] Phone: [phone]   Fax:  [phone]  Occupational Therapy Treatment  Patient Details  Name: Timothy Baker MRN: [id number] Date of Birth: November 09, 1960 Referring Provider (OT): Dr. Rosalin Hawking   Encounter Date: 06/24/2020   OT End of Session - 06/24/20 1505    Visit Number 6    Number of Visits 16    Date for OT Re-Evaluation 07/10/20    Authorization Type BC/BS - VL 30 combined PT/OT (Speech has 30)    Authorization - Visit Number 6    Authorization - Number of Visits 15    OT Start Time 0938    OT Stop Time 1315    OT Time Calculation (min) 45 min    Activity Tolerance Patient tolerated treatment well    Behavior During Therapy Impulsive           Past Medical History:  Diagnosis Date  . Polysubstance abuse ([place])    quit in 2019 - used ETOH, crack cocaine    Past Surgical History:  Procedure Laterality Date  . genital surgery    . LOOP RECORDER INSERTION N/A 05/01/2020   Procedure: LOOP RECORDER INSERTION;  Surgeon: Thompson Grayer, MD;  Location: [facility];  Service: Cardiovascular;  Laterality: N/A;    There were no vitals filed for this visit.   Subjective Assessment - 06/24/20 1233    Subjective  I remember to take my meds now    Pertinent History CVA 04/27/20 with Lt hemiplegia. PMH: HTN, HLD, substance abuse    Limitations loop recorder 05/01/20, no driving    Currently in Pain? No/denies    Pain Onset In the past 7 days           Issued and reviewed/discussed memory strategies especially in re: medication and financial management. Pt reports doing better w/ medications but did forget to pay a bill.   Reviewed shoulder HEP - pt required cues to slow down and hold position longer  Pt copying complex peg design for attention and coordination Lt hand - pt copied design correctly however had one triangle larger (added pegs on either  side). Pt then removed up to 5 at a time for in hand manipulation                     OT Education - 06/24/20 1248    Education Details Memory compensatory strategies    Person(s) Educated Patient    Methods Explanation;Handout;Verbal cues    Comprehension Verbalized understanding            OT Short Term Goals - 06/24/20 1505      OT SHORT TERM GOAL #1   Title Independent with HEP for Lt hand coordination and strength    Time 4    Period Weeks    Status Achieved      OT SHORT TERM GOAL #2   Title Independent with LUE shoulder HEP    Time 4    Period Weeks    Status Achieved      OT SHORT TERM GOAL #3   Title Pt to use Lt hand to feed self and groom 50% of the time    Baseline using Rt non dominant hand    Time 4    Period Weeks    Status Partially Met   100% w/ feeding per pt report, brushing teeth w/ Rt hand     OT  SHORT TERM GOAL #4   Title Pt to make snack and perform light IADLS (washing dishes, folding towels) from standing position safely    Time 4    Period Weeks    Status Achieved   Met in clinic - pt also reports sweeping and washing car     OT SHORT TERM GOAL #5   Title Pt to perform LE dressing at mod I level w/ A/E prn    Time 4    Period Weeks    Status Achieved      OT SHORT TERM GOAL #6   Title Pt to perform walker negotiation and environmental scanning through tight spaces w/ 85% accuracy and no bumps on Lt side    Time 4    Period Weeks    Status On-going   found 12/16 using cane, 06/16/20: found 13/13 items (100%)            OT Long Term Goals - 06/24/20 1506      OT LONG TERM GOAL #1   Title Pt to improve coordination Lt hand as evidenced by performing 9 hole peg test in under 40 sec    Baseline 49.44 sec    Time 8    Period Weeks    Status On-going      OT LONG TERM GOAL #2   Title Pt to improve grip strength Lt hand to 80 lbs or greater    Baseline 72 lbs (Rt = 81)    Time 8    Period Weeks    Status On-going       OT LONG TERM GOAL #3   Title Pt to return to simple cooking tasks and IADLS safely w/ DME/AE prn in prep for return to living alone    Time 8    Period Weeks    Status New      OT LONG TERM GOAL #4   Title Independent with memory compensatory strategies for medication management and paying bills    Time 8    Period Weeks    Status On-going      OT LONG TERM GOAL #5   Title Pt to use Lt dominant hand 90% for eating and grooming tasks    Time 8    Period Weeks    Status On-going                 Plan - 06/24/20 1506    Clinical Impression Statement Pt progressing towards all goals. Pt slightly more attentive today and reports this has always been his personality    OT Occupational Profile and History Detailed Assessment- Review of Records and additional review of physical, cognitive, psychosocial history related to current functional performance    Occupational performance deficits (Please refer to evaluation for details): ADL's;IADL's;Work    Body Structure / Function / Physical Skills ADL;Strength;Dexterity;Balance;Body mechanics;Proprioception;UE functional use;IADL;ROM;Endurance;Coordination;Mobility;FMC;Decreased knowledge of precautions    Cognitive Skills Attention;Safety Awareness;Memory;Perception    Rehab Potential Good    Clinical Decision Making Several treatment options, min-mod task modification necessary    Comorbidities Affecting Occupational Performance: May have comorbidities impacting occupational performance    Modification or Assistance to Complete Evaluation  Min-Moderate modification of tasks or assist with assess necessary to complete eval    OT Frequency 2x / week    OT Duration 8 weeks    OT Treatment/Interventions Self-care/ADL training;DME and/or AE instruction;Therapeutic activities;Aquatic Therapy;Therapeutic exercise;Cognitive remediation/compensation;Coping strategies training;Neuromuscular education;Functional Mobility Training;Passive range  of motion;Visual/perceptual remediation/compensation;Patient/family education;Manual Therapy;Moist Heat  Plan work on grip strength, UBE, writing (following session: cooking task)    Consulted and Agree with Plan of Care Patient           Patient will benefit from skilled therapeutic intervention in order to improve the following deficits and impairments:   Body Structure / Function / Physical Skills: ADL,Strength,Dexterity,Balance,Body mechanics,Proprioception,UE functional use,IADL,ROM,Endurance,Coordination,Mobility,FMC,Decreased knowledge of precautions Cognitive Skills: Attention,Safety Awareness,Memory,Perception     Visit Diagnosis: Hemiplegia and hemiparesis following cerebral infarction affecting left dominant side ([place])  Other lack of coordination    Problem List Patient Active Problem List   Diagnosis Date Noted  . 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 Leukocytosis   . Tobacco abuse   . Prediabetes   . Dyslipidemia   . Tobacco dependence due to cigarettes 04/27/2020    Carey Bullocks, OTR/L 06/24/2020, 3:14 PM  [facility] [address] [place], [place], [address] Phone: [phone]   Fax:  [phone]  Name: JUD FANGUY MRN: [id number] Date of Birth: 08-Oct-1960

## 2020-06-24 NOTE — Therapy (Signed)
[facility] [address] [place], [place], [address] Phone: [phone]   Fax:  [phone]  Physical Therapy Treatment  Patient Details  Name: Timothy Baker MRN: [id number] Date of Birth: 12/19/1960 Referring Provider (PT): Seward Carol MD   Encounter Date: 06/24/2020   PT End of Session - 06/24/20 1201    Visit Number 11    Number of Visits 17    Date for PT Re-Evaluation 06/04/20    Authorization Type BCBS    Progress Note Due on Visit 10    PT Start Time 1145    PT Stop Time 1230    PT Time Calculation (min) 45 min    Equipment Utilized During Treatment Gait belt    Activity Tolerance Patient tolerated treatment well    Behavior During Therapy Impulsive           Past Medical History:  Diagnosis Date  . Polysubstance abuse ([place])    quit in 2019 - used ETOH, crack cocaine    Past Surgical History:  Procedure Laterality Date  . genital surgery    . LOOP RECORDER INSERTION N/A 05/01/2020   Procedure: LOOP RECORDER INSERTION;  Surgeon: Thompson Grayer, MD;  Location: [facility];  Service: Cardiovascular;  Laterality: N/A;    There were no vitals filed for this visit.   Subjective Assessment - 06/24/20 1159    Subjective Reporting some low back tightness today, felt stepping tasks with weighted LLE was helpful as he is now more stable pivoting on LLE    Patient is accompained by: Family member   sister Webb Silversmith   Pertinent History 60 yo male with onset of L side weakness sustained a fall climbing up to change smoke detector battery on 2/5, then on 2/6 came to ED with significant changes in his L side facially and in L extremities.  On imaging his notes were that the infarction was in R anterior limb of internal capsule.  Pt has memory changes, coordination changes and impulsive behavior.  PMHx:  substance abuse, HTN, HLD, tobacco use    Limitations Standing;Walking    How long can you sit comfortably?  unlimited    How long can you stand comfortably? 5-10'    How long can you walk comfortably? 5-10'    Patient Stated Goals To return to I ambulation and mobility    Currently in Pain? No/denies    Pain Onset In the past 7 days                             [facility] Adult PT Treatment/Exercise - 06/24/20 0001      Knee/Hip Exercises: Aerobic   Nustep 8' L4 arms 12               Balance Exercises - 06/24/20 0001      Balance Exercises: Standing   Stepping Strategy Anterior;Posterior;Lateral;Foam/compliant surface;Limitations    Stepping Strategy Limitations 15 step overs and 15 lateral stepups per LE performed from high profile rocker board wearing 3# weight on L ankle    Marching Foam/compliant surface;Upper extremity assist 1;Forwards;15 reps    Marching Limitations runners step with 3# weight on L ankle    Other Standing Exercises heel taps from high profile rockerboard 15x per LE    Other Standing Exercises Comments squatting from high profile rockerboard 15x, no UE support  PT Short Term Goals - 06/22/20 1149      PT SHORT TERM GOAL #1   Title patient to tranfer STS under S with no physical assist    Baseline Min-Mod A to transfer STS; 05/19/20 able to STS transfer under S only; 06/22/20 Able to perform STS w/o need of UE assist    Time 2    Period Weeks    Status Achieved    Target Date 05/19/20      PT SHORT TERM GOAL #2   Title Patient to demo established HEP follwing development of written exercises based on patients abilities and deficits    Baseline no HEP; verbally reviewed exercises    Time 2    Period Weeks    Status Partially Met    Target Date 05/19/20      PT SHORT TERM GOAL #3   Title Patient to ambulate 1109f x2 with RW and close S of PT    Baseline 1175fwith rollator and Min A of PT; ambulation of 11568fith SPC and CGA; 06/22/20 Able to ambulate 1000f27fth SPC and SBA/S    Time 2    Period Weeks    Status Achieved     Target Date 05/19/20      PT SHORT TERM GOAL #4   Title Assess DGI and MCTSIB and establish goals    Baseline not tested; MCTSIB able to hold 30s at all positions    Time 2    Period Days    Status Achieved    Target Date 05/19/20             PT Long Term Goals - 06/22/20 1632      PT LONG TERM GOAL #1   Title Ambulation of 1000ft56fh LRAD and S of PT across level ground    Baseline 115ft 65f RW and MinA of PT across level surface    Time 8    Period Weeks    Status Revised      PT LONG TERM GOAL #2   Title Patient to increase LLE strength to 4/5 grossly throughout    Baseline 3-3+/5 grossly throughout    Time 8    Period Weeks      PT LONG TERM GOAL #3   Title Patient to transfer and mobilize from all surfaces with ony distant S    Baseline patient transfers STS with Min A/CGA and CGA for bed mobility    Time 8    Period Weeks    Status New      PT LONG TERM GOAL #4   Title Assess progress towards DGI and MCTSIB goals    Baseline not set; 06/22/20 FGA goal is 26/30, no CTSIB goal needed    Time 8    Period Weeks    Status New                 Plan - 06/24/20 1329    Clinical Impression Statement Focus of todays skilled session was to address LLE weakness through stepping tasks andstrategies, good response noted from weighting LLE at last session as evidenced by improved swing through in gait as wella s patient report of improved stability when pivoting on LLE, still mildly mpulsive at times    Personal Factors and Comorbidities Comorbidity 3+    Comorbidities HTN, HLD, CVA    Examination-Activity Limitations Stand;Stairs    Examination-Participation Restrictions Occupation    Stability/Clinical Decision Making Evolving/Moderate complexity    Rehab Potential  Good    PT Frequency 2x / week    PT Duration 8 weeks    PT Treatment/Interventions DME Instruction;Gait training;Stair training;Functional mobility training;Therapeutic activities;Therapeutic  exercise;Balance training;Neuromuscular re-education;Cognitive remediation;Patient/family education;Orthotic Fit/Training;Dry needling    PT Next Visit Plan continue to apply weight to LLE during functional tasks, outside ambulation with weighted LLE    PT Home Exercise Plan issued [facility] [id number]    Consulted and Agree with Plan of Care Patient           Patient will benefit from skilled therapeutic intervention in order to improve the following deficits and impairments:  Abnormal gait,Decreased balance,Decreased endurance,Decreased mobility,Difficulty walking,Decreased cognition,Decreased knowledge of precautions,Impaired perceived functional ability,Decreased activity tolerance,Decreased coordination,Decreased safety awareness,Decreased strength  Visit Diagnosis: Unsteadiness on feet  Muscle weakness (generalized)  Hemiplegia and hemiparesis following cerebral infarction affecting left dominant side [facility])     Problem List Patient Active Problem List   Diagnosis Date Noted  . 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 Leukocytosis   . Tobacco abuse   . Prediabetes   . Dyslipidemia   . Tobacco dependence due to cigarettes 04/27/2020    Lanice Shirts  PT 06/24/2020, 1:37 PM  [facility] [address] [place], [place], [address] Phone: [phone]   Fax:  [phone]  Name: Timothy Baker MRN: [id number] Date of Birth: 28-Jul-1960

## 2020-06-29 ENCOUNTER — Ambulatory Visit: Payer: BC Managed Care – PPO | Admitting: Occupational Therapy

## 2020-06-29 ENCOUNTER — Other Ambulatory Visit: Payer: Self-pay

## 2020-06-29 ENCOUNTER — Ambulatory Visit: Payer: BC Managed Care – PPO | Admitting: Speech Pathology

## 2020-06-29 ENCOUNTER — Ambulatory Visit: Payer: BC Managed Care – PPO

## 2020-06-29 ENCOUNTER — Encounter: Payer: Self-pay | Admitting: Speech Pathology

## 2020-06-29 DIAGNOSIS — R278 Other lack of coordination: Secondary | ICD-10-CM

## 2020-06-29 DIAGNOSIS — R2681 Unsteadiness on feet: Secondary | ICD-10-CM

## 2020-06-29 DIAGNOSIS — R471 Dysarthria and anarthria: Secondary | ICD-10-CM

## 2020-06-29 DIAGNOSIS — I69352 Hemiplegia and hemiparesis following cerebral infarction affecting left dominant side: Secondary | ICD-10-CM

## 2020-06-29 DIAGNOSIS — R41841 Cognitive communication deficit: Secondary | ICD-10-CM

## 2020-06-29 DIAGNOSIS — M6281 Muscle weakness (generalized): Secondary | ICD-10-CM

## 2020-06-29 NOTE — Therapy (Signed)
[facility] [address] [place], [place], [address] Phone: [phone]   Fax:  [phone]  Physical Therapy Treatment  Patient Details  Name: Timothy Baker MRN: [id number] Date of Birth: July 19, 1960 Referring Provider (PT): Seward Carol MD   Encounter Date: 06/29/2020   PT End of Session - 06/29/20 1159    Visit Number 12    Number of Visits 17    Date for PT Re-Evaluation 06/04/20    Authorization Type BCBS    Progress Note Due on Visit 10    PT Start Time 1145    PT Stop Time 1230    PT Time Calculation (min) 45 min    Equipment Utilized During Treatment Gait belt    Activity Tolerance Patient tolerated treatment well    Behavior During Therapy Impulsive           Past Medical History:  Diagnosis Date  . Polysubstance abuse ([place])    quit in 2019 - used ETOH, crack cocaine    Past Surgical History:  Procedure Laterality Date  . genital surgery    . LOOP RECORDER INSERTION N/A 05/01/2020   Procedure: LOOP RECORDER INSERTION;  Surgeon: Thompson Grayer, MD;  Location: [facility];  Service: Cardiovascular;  Laterality: N/A;    There were no vitals filed for this visit.   Subjective Assessment - 06/29/20 1157    Subjective No neww c/o, no falls or pain to note, feels more confident pivoting om LLE    Patient is accompained by: Family member    Pertinent History 60 yo male with onset of L side weakness sustained a fall climbing up to change smoke detector battery on 2/5, then on 2/6 came to ED with significant changes in his L side facially and in L extremities.  On imaging his notes were that the infarction was in R anterior limb of internal capsule.  Pt has memory changes, coordination changes and impulsive behavior.  PMHx:  substance abuse, HTN, HLD, tobacco use    Limitations Standing;Walking    How long can you sit comfortably? unlimited    How long can you stand comfortably? 5-10'    How  long can you walk comfortably? 5-10'    Patient Stated Goals To return to I ambulation and mobility    Currently in Pain? No/denies                             [facility] Adult PT Treatment/Exercise - 06/29/20 1154      Ambulation/Gait   Ambulation/Gait Yes    Ambulation/Gait Assistance 5: Supervision    Ambulation/Gait Assistance Details 3# wt on LLE    Ambulation Distance (Feet) 1000 Feet    Assistive device Straight cane    Gait Pattern Decreased arm swing - left;Decreased hip/knee flexion - left    Ambulation Surface Level;Unlevel;Indoor;Outdoor;Paved;Grass      Knee/Hip Exercises: Aerobic   Nustep 8' L5 arms 12               Balance Exercises - 06/29/20 0001      Balance Exercises: Standing   Tandem Gait Forward;Retro;Upper extremity support;Foam/compliant surface;5 reps;Limitations    Tandem Gait Limitations 5 trips in // bars with intermitent UE support, tandem both directions    Sidestepping Foam/compliant support;5 reps;Limitations    Sidestepping Limitations braiding tasks, R over L, 5 trops in // bars with single UE assist    Marching Foam/compliant  surface;Upper extremity assist 1;Dynamic;Forwards;15 reps    Marching Limitations runner step from rocker boar 1x15 per side               PT Short Term Goals - 06/22/20 1149      PT SHORT TERM GOAL #1   Title patient to tranfer STS under S with no physical assist    Baseline Min-Mod A to transfer STS; 05/19/20 able to STS transfer under S only; 06/22/20 Able to perform STS w/o need of UE assist    Time 2    Period Weeks    Status Achieved    Target Date 05/19/20      PT SHORT TERM GOAL #2   Title Patient to demo established HEP follwing development of written exercises based on patients abilities and deficits    Baseline no HEP; verbally reviewed exercises    Time 2    Period Weeks    Status Partially Met    Target Date 05/19/20      PT SHORT TERM GOAL #3   Title Patient to ambulate 142f  x2 with RW and close S of PT    Baseline 1157fwith rollator and Min A of PT; ambulation of 11532fith SPC and CGA; 06/22/20 Able to ambulate 1000f78fth SPC and SBA/S    Time 2    Period Weeks    Status Achieved    Target Date 05/19/20      PT SHORT TERM GOAL #4   Title Assess DGI and MCTSIB and establish goals    Baseline not tested; MCTSIB able to hold 30s at all positions    Time 2    Period Days    Status Achieved    Target Date 05/19/20             PT Long Term Goals - 06/22/20 1632      PT LONG TERM GOAL #1   Title Ambulation of 1000ft39fh LRAD and S of PT across level ground    Baseline 115ft 6f RW and MinA of PT across level surface    Time 8    Period Weeks    Status Revised      PT LONG TERM GOAL #2   Title Patient to increase LLE strength to 4/5 grossly throughout    Baseline 3-3+/5 grossly throughout    Time 8    Period Weeks      PT LONG TERM GOAL #3   Title Patient to transfer and mobilize from all surfaces with ony distant S    Baseline patient transfers STS with Min A/CGA and CGA for bed mobility    Time 8    Period Weeks    Status New      PT LONG TERM GOAL #4   Title Assess progress towards DGI and MCTSIB goals    Baseline not set; 06/22/20 FGA goal is 26/30, no CTSIB goal needed    Time 8    Period Weeks    Status New                 Plan - 06/29/20 1412    Clinical Impression Statement Continued skilled interventions designed to promote L hip/knee function when moving, added wt to L ankle for ambulation outdoors with cane, continued functional tasks with weighted LLE, added braiding(R over L) to facilitate pivoting on LLE as well as tandem walking fwd/bwd    Personal Factors and Comorbidities Comorbidity 3+    Comorbidities HTN, HLD,  CVA    Examination-Activity Limitations Stand;Stairs    Examination-Participation Restrictions Occupation    Stability/Clinical Decision Making Evolving/Moderate complexity    Rehab Potential Good     PT Frequency 2x / week    PT Duration 8 weeks    PT Treatment/Interventions DME Instruction;Gait training;Stair training;Functional mobility training;Therapeutic activities;Therapeutic exercise;Balance training;Neuromuscular re-education;Cognitive remediation;Patient/family education;Orthotic Fit/Training;Dry needling    PT Next Visit Plan continue to focus on control of LLE when pivoting through functional activities incorporating rotational activities at L hip    PT Home Exercise Plan issued [place] and Agree with Plan of Care Patient           Patient will benefit from skilled therapeutic intervention in order to improve the following deficits and impairments:  Abnormal gait,Decreased balance,Decreased endurance,Decreased mobility,Difficulty walking,Decreased cognition,Decreased knowledge of precautions,Impaired perceived functional ability,Decreased activity tolerance,Decreased coordination,Decreased safety awareness,Decreased strength  Visit Diagnosis: Unsteadiness on feet  Hemiplegia and hemiparesis following cerebral infarction affecting left dominant side ([facility])  Muscle weakness (generalized)     Problem List Patient Active Problem List   Diagnosis Date Noted  . 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 Leukocytosis   . Tobacco abuse   . Prediabetes   . Dyslipidemia   . Tobacco dependence due to cigarettes 04/27/2020    Lanice Shirts PT 06/29/2020, 2:18 PM  [facility] [address] [place], [place], [address] Phone: [phone]   Fax:  [phone]  Name: Timothy Baker MRN: [id number] Date of Birth: [birth year]

## 2020-06-29 NOTE — Therapy (Signed)
[facility] [address] [place], [place], [address] Phone: [phone]   Fax:  [phone]  Speech Language Pathology Treatment  Patient Details  Name: Timothy Baker MRN: [id number] Date of Birth: 1960/08/21 Referring Provider (SLP): Dr. Rosalin Hawking   Encounter Date: 06/29/2020   End of Session - 06/29/20 1314    Visit Number 12    Number of Visits 17    Date for SLP Re-Evaluation 07/01/20    Authorization Type BCBS    SLP Start Time 2297    SLP Stop Time  9892    SLP Time Calculation (min) 42 min    Activity Tolerance Patient tolerated treatment well           Past Medical History:  Diagnosis Date  . Polysubstance abuse ([place])    quit in 2019 - used ETOH, crack cocaine    Past Surgical History:  Procedure Laterality Date  . genital surgery    . LOOP RECORDER INSERTION N/A 05/01/2020   Procedure: LOOP RECORDER INSERTION;  Surgeon: Thompson Grayer, MD;  Location: [facility];  Service: Cardiovascular;  Laterality: N/A;    There were no vitals filed for this visit.   Subjective Assessment - 06/29/20 1230    Subjective "I watched the races all week"    Currently in Pain? No/denies                 ADULT SLP TREATMENT - 06/29/20 1230      General Information   Behavior/Cognition Alert;Impulsive;Pleasant mood;Requires cueing      Treatment Provided   Treatment provided Cognitive-Linquistic      Cognitive-Linquistic Treatment   Treatment focused on Cognition;Dysarthria;Patient/family/caregiver education    Skilled Treatment Targeted oraganization and sequencing for IADL's ordering 6 step tasks. Timothy Baker required cues initially as he placed a 1 next to each sentence in a sequence without awareness. After that, Timothy Baker sequenced with rare min A. Volume and over articulation targeted reading the 6 sentences in sequencing task with average 69-70dB with supervision cues 12/12 sentences. In  structred task with oral reading and spontaneous speech (explaining odd word out f: 4) Timothy Baker maintained average of 71dB with occasional min A. In simple conversation Timothy Baker averaged 68dB with usual min visual and verbal cues. Timothy Baker reports success cooking small meal and doing some light household cleaning. Hi is going furniture shopping with his sister because he has a truck. When asked, Timothy Baker stated he would not lift any furniture, but get someone else to help, verbaized anticiapatory awareness with supervision cues      Assessment / Recommendations / Plan   Plan Continue with current plan of care      Progression Toward Goals   Progression toward goals Progressing toward goals              SLP Short Term Goals - 06/29/20 1313      SLP SHORT TERM GOAL #1   Title Pt will complete HEP for dysarthria with occasional min A    Baseline 05-28-20    Period Weeks    Status Partially Met      SLP SHORT TERM GOAL #2   Title Pt will average 70dB on sentence responses 14/15x with occasional min A    Baseline 05-28-20    Period Weeks    Status Achieved      SLP SHORT TERM GOAL #3   Title Pt will utilize compensations for memory to manage schedule, appointments and medication with occasional  min A from family and friends    Baseline 06-02-20    Period Weeks    Status Partially Met      SLP SHORT TERM GOAL #4   Title Pt will verbalize 3 safety concerns in the home and generate solutions to them (emergent awareness) with occasional min A    Baseline 05-28-20    Period Weeks    Status Partially Met      SLP SHORT TERM GOAL #5   Title Pt will be 90% intelligible over 5 minute conversation in mildly noisy environment with occasional min A    Period Weeks    Status Not Met            SLP Long Term Goals - 06/29/20 1313      SLP LONG TERM GOAL #1   Title Pt will complete HEP for dysarthria with occasional min A    Time 1    Period Weeks    Status On-going      SLP LONG TERM GOAL  #2   Title Pt will average 70dB over 3 sentence utterances with occasional min A    Time 1    Period Weeks    Status On-going      SLP LONG TERM GOAL #3   Title Pt will verbalize carryover of  anticipatory awareness for 3 safety situations in the home    Baseline 06/29/20;    Time 1    Period Weeks    Status On-going      SLP LONG TERM GOAL #4   Title Pt will be 85% intellgible over 3 sentence utterance with occasional min A    Time 1    Period Weeks    Status On-going      SLP LONG TERM GOAL #5   Title Score on Communicative Effectivess Survey will increase (family to fill out ) by 2 points.    Baseline 16    Time 1    Period Weeks    Status On-going            Plan - 06/29/20 1310    Clinical Impression Statement "Timothy Baker" presents with mild to mod cognitive linguistic impairment and moderate mixed dysarthria c/b imprecise articulation, fast rate of speech, and decreased vocal intensity. Pt's brother confirmed speech in nearing baseline. Reading aloud practice completed, in which pt able to self-correct with improve accuracy this session. SLP assessed emergent and anticipatory awareness, with pt able to verbalize safety scenarios and skills required for return to work with moderate prompting. Cues required given pt frequently repeated same information with different syntax. SLP recommends skilled ST to maximize intelligibility and cognitive communciation skills for safety, to reduce caregiver burden and return to PLOF.    Speech Therapy Frequency 2x / week    Duration 8 weeks   17 visits   Treatment/Interventions Diet toleration management by SLP;Environmental controls;Cueing hierarchy;SLP instruction and feedback;Compensatory strategies;Functional tasks;Cognitive reorganization;Compensatory techniques;Internal/external aids;Multimodal communcation approach;Patient/family education    Potential to Achieve Goals Fair    Potential Considerations Cooperation/participation  level;Previous level of function           Patient will benefit from skilled therapeutic intervention in order to improve the following deficits and impairments:   Dysarthria and anarthria  Cognitive communication deficit    Problem List Patient Active Problem List   Diagnosis Date Noted  . 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 Leukocytosis   . Tobacco abuse   . Prediabetes   . Dyslipidemia   .  Tobacco dependence due to cigarettes 04/27/2020    Eimy Plaza, Annye Rusk MS, CCC-SLP 06/29/2020, 1:14 PM  [facility] [address] [place], [place], [address] Phone: [phone]   Fax:  [phone]   Name: TYREN DUGAR MRN: [id number] Date of Birth: [birth year]

## 2020-06-29 NOTE — Therapy (Signed)
[facility] [address] [place], [place], [address] Phone: [phone]   Fax:  [phone]  Occupational Therapy Treatment  Patient Details  Name: Timothy Baker MRN: [id number] Date of Birth: 11/01/1960 Referring Provider (OT): Dr. Rosalin Hawking   Encounter Date: 06/29/2020   OT End of Session - 06/29/20 1113    Visit Number 7    Number of Visits 16    Date for OT Re-Evaluation 07/10/20    Authorization Type BC/BS - VL 30 combined PT/OT (Speech has 30)    Authorization - Visit Number 7    Authorization - Number of Visits 15    OT Start Time 1102    OT Stop Time 1145    OT Time Calculation (min) 43 min    Activity Tolerance Patient tolerated treatment well    Behavior During Therapy Impulsive           Past Medical History:  Diagnosis Date  . Polysubstance abuse ([place])    quit in 2019 - used ETOH, crack cocaine    Past Surgical History:  Procedure Laterality Date  . genital surgery    . LOOP RECORDER INSERTION N/A 05/01/2020   Procedure: LOOP RECORDER INSERTION;  Surgeon: Thompson Grayer, MD;  Location: [facility];  Service: Cardiovascular;  Laterality: N/A;    There were no vitals filed for this visit.   Subjective Assessment - 06/29/20 1107    Subjective  I remember to take my meds now    Pertinent History CVA 04/27/20 with Lt hemiplegia. PMH: HTN, HLD, substance abuse    Limitations loop recorder 05/01/20, no driving    Currently in Pain? No/denies    Pain Onset In the past 7 days                        OT Treatments/Exercises (OP) - 06/29/20 0001      ADLs   Writing Writing 2 paragraphs maintaining 95% legibility - pt only appeared to have mild deficits in expressive aphasia      Exercises   Exercises Shoulder;Hand      Shoulder Exercises: ROM/Strengthening   UBE (Upper Arm Bike) UBE x 10 min, level 3 resistance (5 min forward, 5 min backward)      Hand Exercises    Other Hand Exercises Gripper set at level 4 resistance (black coil) to pick up blocks Lt hand for sustained grip strength, mod drops, and 3 rest breaks required.                    OT Short Term Goals - 06/29/20 1136      OT SHORT TERM GOAL #1   Title Independent with HEP for Lt hand coordination and strength    Time 4    Period Weeks    Status Achieved      OT SHORT TERM GOAL #2   Title Independent with LUE shoulder HEP    Time 4    Period Weeks    Status Achieved      OT SHORT TERM GOAL #3   Title Pt to use Lt hand to feed self and groom 50% of the time    Baseline using Rt non dominant hand    Time 4    Period Weeks    Status Partially Met   100% w/ feeding per pt report, brushing teeth w/ Rt hand     OT SHORT TERM GOAL #4  Title Pt to make snack and perform light IADLS (washing dishes, folding towels) from standing position safely    Time 4    Period Weeks    Status Achieved   Met in clinic - pt also reports sweeping and washing car     OT SHORT TERM GOAL #5   Title Pt to perform LE dressing at mod I level w/ A/E prn    Time 4    Period Weeks    Status Achieved      OT SHORT TERM GOAL #6   Title Pt to perform walker negotiation and environmental scanning through tight spaces w/ 85% accuracy and no bumps on Lt side    Time 4    Period Weeks    Status Achieved   found 12/16 using cane, 06/16/20: found 13/13 items (100%) w/ cane            OT Long Term Goals - 06/24/20 1506      OT LONG TERM GOAL #1   Title Pt to improve coordination Lt hand as evidenced by performing 9 hole peg test in under 40 sec    Baseline 49.44 sec    Time 8    Period Weeks    Status On-going      OT LONG TERM GOAL #2   Title Pt to improve grip strength Lt hand to 80 lbs or greater    Baseline 72 lbs (Rt = 81)    Time 8    Period Weeks    Status On-going      OT LONG TERM GOAL #3   Title Pt to return to simple cooking tasks and IADLS safely w/ DME/AE prn in prep for  return to living alone    Time 8    Period Weeks    Status New      OT LONG TERM GOAL #4   Title Independent with memory compensatory strategies for medication management and paying bills    Time 8    Period Weeks    Status On-going      OT LONG TERM GOAL #5   Title Pt to use Lt dominant hand 90% for eating and grooming tasks    Time 8    Period Weeks    Status On-going                 Plan - 06/29/20 1137    Clinical Impression Statement Pt progressing towards all goals. Pt slightly more attentive today and reports this has always been his personality    OT Occupational Profile and History Detailed Assessment- Review of Records and additional review of physical, cognitive, psychosocial history related to current functional performance    Occupational performance deficits (Please refer to evaluation for details): ADL's;IADL's;Work    Body Structure / Function / Physical Skills ADL;Strength;Dexterity;Balance;Body mechanics;Proprioception;UE functional use;IADL;ROM;Endurance;Coordination;Mobility;FMC;Decreased knowledge of precautions    Cognitive Skills Attention;Safety Awareness;Memory;Perception    Rehab Potential Good    Clinical Decision Making Several treatment options, min-mod task modification necessary    Comorbidities Affecting Occupational Performance: May have comorbidities impacting occupational performance    Modification or Assistance to Complete Evaluation  Min-Moderate modification of tasks or assist with assess necessary to complete eval    OT Frequency 2x / week    OT Duration 8 weeks    OT Treatment/Interventions Self-care/ADL training;DME and/or AE instruction;Therapeutic activities;Aquatic Therapy;Therapeutic exercise;Cognitive remediation/compensation;Coping strategies training;Neuromuscular education;Functional Mobility Training;Passive range of motion;Visual/perceptual remediation/compensation;Patient/family education;Manual Therapy;Moist Heat    Plan  cooking  task    Consulted and Agree with Plan of Care Patient           Patient will benefit from skilled therapeutic intervention in order to improve the following deficits and impairments:   Body Structure / Function / Physical Skills: ADL,Strength,Dexterity,Balance,Body mechanics,Proprioception,UE functional use,IADL,ROM,Endurance,Coordination,Mobility,FMC,Decreased knowledge of precautions Cognitive Skills: Attention,Safety Awareness,Memory,Perception     Visit Diagnosis: Hemiplegia and hemiparesis following cerebral infarction affecting left dominant side (HCC)  Muscle weakness (generalized)  Other lack of coordination    Problem List Patient Active Problem List   Diagnosis Date Noted  . 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 Leukocytosis   . Tobacco abuse   . Prediabetes   . Dyslipidemia   . Tobacco dependence due to cigarettes 04/27/2020    Carey Bullocks, OTR/L 06/29/2020, 11:38 AM  [facility] [address] [place], [place], [address] Phone: [phone]   Fax:  [phone]  Name: Timothy Baker MRN: [id number] Date of Birth: 02/16/1961

## 2020-07-01 ENCOUNTER — Ambulatory Visit: Payer: BC Managed Care – PPO | Admitting: Speech Pathology

## 2020-07-01 ENCOUNTER — Encounter: Payer: Self-pay | Admitting: Speech Pathology

## 2020-07-01 ENCOUNTER — Ambulatory Visit: Payer: BC Managed Care – PPO

## 2020-07-01 ENCOUNTER — Other Ambulatory Visit: Payer: Self-pay

## 2020-07-01 ENCOUNTER — Ambulatory Visit: Payer: BC Managed Care – PPO | Admitting: Occupational Therapy

## 2020-07-01 DIAGNOSIS — R2681 Unsteadiness on feet: Secondary | ICD-10-CM | POA: Diagnosis not present

## 2020-07-01 DIAGNOSIS — R278 Other lack of coordination: Secondary | ICD-10-CM

## 2020-07-01 DIAGNOSIS — R41841 Cognitive communication deficit: Secondary | ICD-10-CM

## 2020-07-01 DIAGNOSIS — I63411 Cerebral infarction due to embolism of right middle cerebral artery: Secondary | ICD-10-CM

## 2020-07-01 DIAGNOSIS — R471 Dysarthria and anarthria: Secondary | ICD-10-CM

## 2020-07-01 DIAGNOSIS — I69352 Hemiplegia and hemiparesis following cerebral infarction affecting left dominant side: Secondary | ICD-10-CM

## 2020-07-01 NOTE — Therapy (Signed)
[facility] [address] [place], [place], [address] Phone: [phone]   Fax:  [phone]  Physical Therapy Treatment  Patient Details  Name: Timothy Baker MRN: [id number] Date of Birth: [birth year] Referring Provider (PT): Seward Carol MD   Encounter Date: 07/01/2020   PT End of Session - 07/01/20 1150    Visit Number 13    Number of Visits 17    Date for PT Re-Evaluation 06/04/20    Authorization Type BCBS    Progress Note Due on Visit 20    PT Start Time 1145    PT Stop Time 1230    PT Time Calculation (min) 45 min    Equipment Utilized During Treatment Gait belt    Activity Tolerance Patient tolerated treatment well    Behavior During Therapy Impulsive           Past Medical History:  Diagnosis Date  . Polysubstance abuse ([place])    quit in 2019 - used ETOH, crack cocaine    Past Surgical History:  Procedure Laterality Date  . genital surgery    . LOOP RECORDER INSERTION N/A 05/01/2020   Procedure: LOOP RECORDER INSERTION;  Surgeon: Thompson Grayer, MD;  Location: [facility];  Service: Cardiovascular;  Laterality: N/A;    There were no vitals filed for this visit.   Subjective Assessment - 07/01/20 1149    Subjective No new c/o, no falls or pain to note, feels more confident pivoting on LLE, Nustep lossens things up    Patient is accompained by: Family member    Pertinent History 60 yo male with onset of L side weakness sustained a fall climbing up to change smoke detector battery on 2/5, then on 2/6 came to ED with significant changes in his L side facially and in L extremities.  On imaging his notes were that the infarction was in R anterior limb of internal capsule.  Pt has memory changes, coordination changes and impulsive behavior.  PMHx:  substance abuse, HTN, HLD, tobacco use    Limitations Standing;Walking    How long can you sit comfortably? unlimited    How long can you stand  comfortably? 5-10'    How long can you walk comfortably? 5-10'    Patient Stated Goals To return to I ambulation and mobility    Pain Onset In the past 7 days                             [facility] Adult PT Treatment/Exercise - 07/01/20 0001      Knee/Hip Exercises: Aerobic   Nustep 8' L5 x10'      Knee/Hip Exercises: Supine   Bridges Strengthening;Left;3 sets;10 reps;Limitations    Bridges Limitations single leg    Other Supine Knee/Hip Exercises R sidelie clamshell 3x10 with manual stab to prevent rolling               Balance Exercises - 07/01/20 0001      Balance Exercises: Standing   SLS with Vectors Foam/compliant surface;Upper extremity assist 1;Limitations    SLS with Vectors Limitations 3# weight on LLE, standing on Airex, tapping cones 10x ant, 10x cross body, 10x alt. LE    Other Standing Exercises perfomd braiding with 3# wt on LLE alt b/t forwrafd and bwd foot placement 3 trips in // bars    Other Standing Exercises Comments in // bars, L knee on stool for IR/ER 30x  PT Education - 07/01/20 1209    Education Details Added single leg bridge and R sidelie clamshells    Person(s) Educated Patient    Methods Explanation;Demonstration;Tactile cues;Verbal cues;Handout    Comprehension Verbalized understanding;Need further instruction            PT Short Term Goals - 06/22/20 1149      PT SHORT TERM GOAL #1   Title patient to tranfer STS under S with no physical assist    Baseline Min-Mod A to transfer STS; 05/19/20 able to STS transfer under S only; 06/22/20 Able to perform STS w/o need of UE assist    Time 2    Period Weeks    Status Achieved    Target Date 05/19/20      PT SHORT TERM GOAL #2   Title Patient to demo established HEP follwing development of written exercises based on patients abilities and deficits    Baseline no HEP; verbally reviewed exercises    Time 2    Period Weeks    Status Partially Met    Target Date  05/19/20      PT SHORT TERM GOAL #3   Title Patient to ambulate 135f x2 with RW and close S of PT    Baseline 1132fwith rollator and Min A of PT; ambulation of 11572fith SPC and CGA; 06/22/20 Able to ambulate 1000f55fth SPC and SBA/S    Time 2    Period Weeks    Status Achieved    Target Date 05/19/20      PT SHORT TERM GOAL #4   Title Assess DGI and MCTSIB and establish goals    Baseline not tested; MCTSIB able to hold 30s at all positions    Time 2    Period Days    Status Achieved    Target Date 05/19/20             PT Long Term Goals - 06/22/20 1632      PT LONG TERM GOAL #1   Title Ambulation of 1000ft90fh LRAD and S of PT across level ground    Baseline 115ft 79f RW and MinA of PT across level surface    Time 8    Period Weeks    Status Revised      PT LONG TERM GOAL #2   Title Patient to increase LLE strength to 4/5 grossly throughout    Baseline 3-3+/5 grossly throughout    Time 8    Period Weeks      PT LONG TERM GOAL #3   Title Patient to transfer and mobilize from all surfaces with ony distant S    Baseline patient transfers STS with Min A/CGA and CGA for bed mobility    Time 8    Period Weeks    Status New      PT LONG TERM GOAL #4   Title Assess progress towards DGI and MCTSIB goals    Baseline not set; 06/22/20 FGA goal is 26/30, no CTSIB goal needed    Time 8    Period Weeks    Status New                 Plan - 07/01/20 1234    Clinical Impression Statement Todays skilled session consisted of BLE strength and endurance training, focus remains on L hip strengthening and HEP ammended, issued exercises for L hip abduction tasks as well as activities to strengthen IR/ER ta assist with LLE swing through  during gait    Personal Factors and Comorbidities Comorbidity 3+    Comorbidities HTN, HLD, CVA    Examination-Activity Limitations Stand;Stairs    Examination-Participation Restrictions Occupation    Stability/Clinical Decision Making  Evolving/Moderate complexity    Rehab Potential Good    PT Frequency 2x / week    PT Duration 8 weeks    PT Treatment/Interventions DME Instruction;Gait training;Stair training;Functional mobility training;Therapeutic activities;Therapeutic exercise;Balance training;Neuromuscular re-education;Cognitive remediation;Patient/family education;Orthotic Fit/Training;Dry needling    PT Next Visit Plan continue to focus on control of LLE when pivoting through functional activities incorporating rotational activities at L hip    PT Home Exercise Plan issued TKE [id number]    Consulted and Agree with Plan of Care Patient           Patient will benefit from skilled therapeutic intervention in order to improve the following deficits and impairments:  Abnormal gait,Decreased balance,Decreased endurance,Decreased mobility,Difficulty walking,Decreased cognition,Decreased knowledge of precautions,Impaired perceived functional ability,Decreased activity tolerance,Decreased coordination,Decreased safety awareness,Decreased strength  Visit Diagnosis: Cerebral infarction due to embolism of right middle cerebral artery [facility])     Problem List Patient Active Problem List   Diagnosis Date Noted  . 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 Leukocytosis   . Tobacco abuse   . Prediabetes   . Dyslipidemia   . Tobacco dependence due to cigarettes 04/27/2020    Lanice Shirts PT 07/01/2020, 3:03 PM  [facility] [address] [place], [place], [address] Phone: [phone]   Fax:  [phone]  Name: Timothy Baker MRN: [id number] Date of Birth: May 28, 1960

## 2020-07-01 NOTE — Therapy (Signed)
Timothy Baker [address] [place], [place], [address] Phone: [phone]   Fax:  [phone]  Speech Language Pathology Treatment  Patient Details  Name: Timothy Baker MRN: [id number] Date of Birth: March 11, 1961 Referring Provider (SLP): Dr. Rosalin Hawking   Encounter Date: 07/01/2020   End of Session - 07/01/20 1419    Visit Number 13    Number of Visits 17    Date for SLP Re-Evaluation 07/01/20    Authorization Type BCBS    SLP Start Time 0254    SLP Stop Time  1145    SLP Time Calculation (min) 43 min    Activity Tolerance Patient tolerated treatment well           Past Medical History:  Diagnosis Date  . Polysubstance abuse ([place])    quit in 2019 - used ETOH, crack cocaine    Past Surgical History:  Procedure Laterality Date  . genital surgery    . LOOP RECORDER INSERTION N/A 05/01/2020   Procedure: LOOP RECORDER INSERTION;  Surgeon: Thompson Grayer, MD;  Location: [facility];  Service: Cardiovascular;  Laterality: N/A;    There were no vitals filed for this visit.   Subjective Assessment - 07/01/20 1111    Subjective "I had trouble with this one" re: sequencing HW    Currently in Pain? No/denies                 ADULT SLP TREATMENT - 07/01/20 1112      General Information   Behavior/Cognition Alert;Impulsive;Pleasant mood;Requires cueing      Treatment Provided   Treatment provided Cognitive-Linquistic      Cognitive-Linquistic Treatment   Treatment focused on Cognition;Dysarthria;Patient/family/caregiver education    Skilled Treatment Targeted awareness of safey issues in the home and generated solutions with occasional min A and targeted volume with 68dB average with usual min to mod verbal and visual cues. Timothy Baker is weed wacked yesterday and reported safety awareness. Today he plans to use riding mower to cut grass. He verbalized 3 safety issues with riding mower and solutions with  rare min questioning cues. Instructured task (similariites and differences) Timothy Baker averaged 69dB. 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 We generated strategy of detailing drivers seat, taking a break or doing 1 section a day.      Assessment / Recommendations / Plan   Plan Continue with current plan of care      Progression Toward Goals   Progression toward goals Progressing toward goals            SLP Education - 07/01/20 1146    Education Details energy conservation and taking breaks during longer chores    Person(s) Educated Patient    Methods Explanation;Verbal cues;Handout    Comprehension Verbalized understanding            SLP Short Term Goals - 07/01/20 1419      SLP SHORT TERM GOAL #1   Title Pt will complete HEP for dysarthria with occasional min A    Baseline 05-28-20    Period Weeks    Status Partially Met      SLP SHORT TERM GOAL #2   Title Pt will average 70dB on sentence responses 14/15x with occasional min A    Baseline 05-28-20    Period Weeks    Status Achieved      SLP SHORT TERM GOAL #3   Title  Pt will utilize compensations for memory to manage schedule, appointments and medication with occasional min A from family and friends    Baseline 06-02-20    Period Weeks    Status Partially Met      SLP SHORT TERM GOAL #4   Title Pt will verbalize 3 safety concerns in the home and generate solutions to them (emergent awareness) with occasional min A    Baseline 05-28-20    Period Weeks    Status Partially Met      SLP SHORT TERM GOAL #5   Title Pt will be 90% intelligible over 5 minute conversation in mildly noisy environment with occasional min A    Period Weeks    Status Not Met            SLP Long Term Goals - 07/01/20 1419      SLP LONG TERM GOAL #1   Title Pt will complete HEP for dysarthria with occasional min A    Time 1    Period Weeks    Status On-going      SLP LONG TERM  GOAL #2   Title Pt will average 70dB over 3 sentence utterances with occasional min A    Time 1    Period Weeks    Status On-going      SLP LONG TERM GOAL #3   Title Pt will verbalize carryover of  anticipatory awareness for 3 safety situations in the home    Baseline 06/29/20; 07/01/20    Time 1    Period Weeks    Status On-going      SLP LONG TERM GOAL #4   Title Pt will be 85% intellgible over 3 sentence utterance with occasional min A    Time 1    Period Weeks    Status On-going      SLP LONG TERM GOAL #5   Title Score on Communicative Effectivess Survey will increase (family to fill out ) by 2 points.    Baseline 16    Time 1    Period Weeks    Status On-going            Plan - 07/01/20 1146    Clinical Impression Statement Timothy Baker has improved anticiapatory awareness and safety awareness in regards to him returning home safely. He will have his nephew stay with him. He is reporting making safe decisions at home.  Volume and intelligilbity improved, however volume remains sub WNL, which is baseline per family. Continue skilled ST 1 -2 more sessions to maximize intelligibility and safety for return home.    Speech Therapy Frequency 2x / week    Duration 8 weeks   17 visits   Treatment/Interventions Diet toleration management by SLP;Environmental controls;Cueing hierarchy;SLP instruction and feedback;Compensatory strategies;Functional tasks;Cognitive reorganization;Compensatory techniques;Internal/external aids;Multimodal communcation approach;Patient/family education    Potential to Achieve Goals Fair    Potential Considerations Cooperation/participation level;Previous level of function           Patient will benefit from skilled therapeutic intervention in order to improve the following deficits and impairments:   Dysarthria and anarthria  Cognitive communication deficit    Problem List Patient Active Problem List   Diagnosis Date Noted  . 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 Leukocytosis   . Tobacco abuse   . Prediabetes   . Dyslipidemia   . Tobacco dependence due to cigarettes 04/27/2020    Timothy Baker, Annye Rusk MS, CCC-SLP 07/01/2020, 2:20 PM  [facility]  [place], [place], [address] Phone: [phone]   Fax:  [phone]   Name: Timothy JANUSZEWSKI MRN: [id number] Date of Birth: [birth year]

## 2020-07-01 NOTE — Patient Instructions (Signed)
Added single leg bridge and R sidelie clamshells

## 2020-07-01 NOTE — Therapy (Signed)
[facility] [address] [place], [place], [address] Phone: [phone]   Fax:  [phone]  Occupational Therapy Treatment  Patient Details  Name: Timothy Baker MRN: [id number] Date of Birth: 02/05/1961 Referring Provider (OT): Dr. Rosalin Hawking   Encounter Date: 07/01/2020   OT End of Session - 07/01/20 1252    Visit Number 8    Number of Visits 16    Date for OT Re-Evaluation 07/10/20    Authorization Type BC/BS - VL 30 combined PT/OT (Speech has 30)    Authorization - Visit Number 8    Authorization - Number of Visits 15    OT Start Time 7001    OT Stop Time 1315    OT Time Calculation (min) 40 min    Activity Tolerance Patient tolerated treatment well    Behavior During Therapy Impulsive           Past Medical History:  Diagnosis Date  . Polysubstance abuse ([place])    quit in 2019 - used ETOH, crack cocaine    Past Surgical History:  Procedure Laterality Date  . genital surgery    . LOOP RECORDER INSERTION N/A 05/01/2020   Procedure: LOOP RECORDER INSERTION;  Surgeon: Thompson Grayer, MD;  Location: [facility];  Service: Cardiovascular;  Laterality: N/A;    There were no vitals filed for this visit.   Subjective Assessment - 07/01/20 1250    Subjective  I would add onions to my egg    Pertinent History CVA 04/27/20 with Lt hemiplegia. PMH: HTN, HLD, substance abuse    Limitations loop recorder 05/01/20, no driving    Currently in Pain? No/denies           Pt asked to wash hands prior to cooking scrambled egg. Pt did not get soap off hands and required cues to fully rinse, then left water running and cued to turn water off. Pt w/ LOB x 1 tripping over chair.  Pt gathered ingredients (egg, fork, frying pan, cued to get bowl) and initially turned on wrong eye but self corrected. Pt needed cues to adjust stove to increase temp faster. Pt then finished cooking egg and turning off stove I'ly. Pt  washed dishes I'ly w/ cue to let pan cool off first.  Pt copying simple PVC pipe design w/ initial set up/min cues then copied I'ly. Progressed to more complex PVC design and copied with 100% accuracy                       OT Short Term Goals - 06/29/20 1136      OT SHORT TERM GOAL #1   Title Independent with HEP for Lt hand coordination and strength    Time 4    Period Weeks    Status Achieved      OT SHORT TERM GOAL #2   Title Independent with LUE shoulder HEP    Time 4    Period Weeks    Status Achieved      OT SHORT TERM GOAL #3   Title Pt to use Lt hand to feed self and groom 50% of the time    Baseline using Rt non dominant hand    Time 4    Period Weeks    Status Partially Met   100% w/ feeding per pt report, brushing teeth w/ Rt hand     OT SHORT TERM GOAL #4   Title Pt to make snack  and perform light IADLS (washing dishes, folding towels) from standing position safely    Time 4    Period Weeks    Status Achieved   Met in clinic - pt also reports sweeping and washing car     OT SHORT TERM GOAL #5   Title Pt to perform LE dressing at mod I level w/ A/E prn    Time 4    Period Weeks    Status Achieved      OT SHORT TERM GOAL #6   Title Pt to perform walker negotiation and environmental scanning through tight spaces w/ 85% accuracy and no bumps on Lt side    Time 4    Period Weeks    Status Achieved   found 12/16 using cane, 06/16/20: found 13/13 items (100%) w/ cane            OT Long Term Goals - 07/01/20 1300      OT LONG TERM GOAL #1   Title Pt to improve coordination Lt hand as evidenced by performing 9 hole peg test in under 40 sec    Baseline 49.44 sec    Time 8    Period Weeks    Status On-going      OT LONG TERM GOAL #2   Title Pt to improve grip strength Lt hand to 80 lbs or greater    Baseline 72 lbs (Rt = 81)    Time 8    Period Weeks    Status On-going      OT LONG TERM GOAL #3   Title Pt to return to simple cooking  tasks and IADLS safely w/ DME/AE prn in prep for return to living alone    Time 8    Period Weeks    Status On-going   07/01/20: pt made egg w/ supervision and min to mod cueing required for safety     OT LONG TERM GOAL #4   Title Independent with memory compensatory strategies for medication management and paying bills    Time 8    Period Weeks    Status On-going      OT LONG TERM GOAL #5   Title Pt to use Lt dominant hand 90% for eating and grooming tasks    Time 8    Period Weeks    Status On-going                 Plan - 07/01/20 1304    Clinical Impression Statement Pt progressing towards all goals. Recommended pt only cook simple items with supervision for safety    OT Occupational Profile and History Detailed Assessment- Review of Records and additional review of physical, cognitive, psychosocial history related to current functional performance    Occupational performance deficits (Please refer to evaluation for details): ADL's;IADL's;Work    Body Structure / Function / Physical Skills ADL;Strength;Dexterity;Balance;Body mechanics;Proprioception;UE functional use;IADL;ROM;Endurance;Coordination;Mobility;FMC;Decreased knowledge of precautions    Cognitive Skills Attention;Safety Awareness;Memory;Perception    Rehab Potential Good    Clinical Decision Making Several treatment options, min-mod task modification necessary    Comorbidities Affecting Occupational Performance: May have comorbidities impacting occupational performance    Modification or Assistance to Complete Evaluation  Min-Moderate modification of tasks or assist with assess necessary to complete eval    OT Frequency 2x / week    OT Duration 8 weeks    OT Treatment/Interventions Self-care/ADL training;DME and/or AE instruction;Therapeutic activities;Aquatic Therapy;Therapeutic exercise;Cognitive remediation/compensation;Coping strategies training;Neuromuscular education;Functional Mobility Training;Passive range  of motion;Visual/perceptual remediation/compensation;Patient/family education;Manual  Therapy;Moist Heat    Plan cook side of rice, continue coordination, grip strength, attention   Consulted and Agree with Plan of Care Patient           Patient will benefit from skilled therapeutic intervention in order to improve the following deficits and impairments:   Body Structure / Function / Physical Skills: ADL,Strength,Dexterity,Balance,Body mechanics,Proprioception,UE functional use,IADL,ROM,Endurance,Coordination,Mobility,FMC,Decreased knowledge of precautions Cognitive Skills: Attention,Safety Awareness,Memory,Perception     Visit Diagnosis: Hemiplegia and hemiparesis following cerebral infarction affecting left dominant side (HCC)  Other lack of coordination  Unsteadiness on feet    Problem List Patient Active Problem List   Diagnosis Date Noted  . 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 Leukocytosis   . Tobacco abuse   . Prediabetes   . Dyslipidemia   . Tobacco dependence due to cigarettes 04/27/2020    Carey Bullocks, OTR/L 07/01/2020, 1:06 PM  [facility] [address] [place], [place], [address] Phone: [phone]   Fax:  [phone]  Name: Timothy Baker MRN: [id number] Date of Birth: 03/29/1960

## 2020-07-13 ENCOUNTER — Ambulatory Visit: Payer: BC Managed Care – PPO | Admitting: Occupational Therapy

## 2020-07-13 ENCOUNTER — Ambulatory Visit: Payer: BC Managed Care – PPO | Admitting: Speech Pathology

## 2020-07-13 ENCOUNTER — Ambulatory Visit: Payer: BC Managed Care – PPO

## 2020-07-13 ENCOUNTER — Other Ambulatory Visit: Payer: Self-pay

## 2020-07-13 DIAGNOSIS — R2681 Unsteadiness on feet: Secondary | ICD-10-CM | POA: Diagnosis not present

## 2020-07-13 DIAGNOSIS — I69352 Hemiplegia and hemiparesis following cerebral infarction affecting left dominant side: Secondary | ICD-10-CM

## 2020-07-13 DIAGNOSIS — M6281 Muscle weakness (generalized): Secondary | ICD-10-CM

## 2020-07-13 DIAGNOSIS — R278 Other lack of coordination: Secondary | ICD-10-CM

## 2020-07-13 DIAGNOSIS — I6389 Other cerebral infarction: Secondary | ICD-10-CM

## 2020-07-13 NOTE — Therapy (Signed)
[facility] [address] [place], [place], [address] Phone: [phone]   Fax:  [phone]  Physical Therapy Treatment  Patient Details  Name: Timothy Baker MRN: [id number] Date of Birth: 09/23/1960 Referring Provider (PT): Seward Carol MD   Encounter Date: 07/13/2020   PT End of Session - 07/13/20 1152    Visit Number 14    Number of Visits 17    Date for PT Re-Evaluation 06/04/20    Authorization Type BCBS    Progress Note Due on Visit 20    PT Start Time 1150    PT Stop Time 1230    PT Time Calculation (min) 40 min    Equipment Utilized During Treatment Gait belt    Activity Tolerance Patient tolerated treatment well           Past Medical History:  Diagnosis Date  . Polysubstance abuse ([place])    quit in 2019 - used ETOH, crack cocaine    Past Surgical History:  Procedure Laterality Date  . genital surgery    . LOOP RECORDER INSERTION N/A 05/01/2020   Procedure: LOOP RECORDER INSERTION;  Surgeon: Thompson Grayer, MD;  Location: [facility];  Service: Cardiovascular;  Laterality: N/A;    There were no vitals filed for this visit.   Subjective Assessment - 07/13/20 1154    Subjective No falls or LOB to report, goes w/o cane at home for most activities, has been on vacation the past week and cites no setbacks, feels he is ready for DC over the next several sesions of PT    Patient is accompained by: Family member    Pertinent History 60 yo male with onset of L side weakness sustained a fall climbing up to change smoke detector battery on 2/5, then on 2/6 came to ED with significant changes in his L side facially and in L extremities.  On imaging his notes were that the infarction was in R anterior limb of internal capsule.  Pt has memory changes, coordination changes and impulsive behavior.  PMHx:  substance abuse, HTN, HLD, tobacco use    Limitations Standing;Walking    How long can you sit  comfortably? unlimited    How long can you stand comfortably? 5-10'    How long can you walk comfortably? 5-10'    Patient Stated Goals To return to I ambulation and mobility    Pain Onset In the past 7 days                             [facility] Adult PT Treatment/Exercise - 07/13/20 0001      Knee/Hip Exercises: Aerobic   Nustep 8' L5 arms 12      Knee/Hip Exercises: Supine   Short Arc Quad Sets Strengthening;Left;2 sets;15 reps;Limitations    Short Arc Quad Sets Limitations 3#    Bridges Strengthening;Left;2 sets;15 reps;Limitations    Bridges Limitations L single leg    Other Supine Knee/Hip Exercises R sidelie clamshells with 3# on LLE    Other Supine Knee/Hip Exercises LLE marching 2x15 3#               Balance Exercises - 07/13/20 0001      Balance Exercises: Standing   Standing Eyes Opened Wide (BOA);1 rep;Time;Limitations    Standing Eyes Opened Time 60s    Standing Eyes Opened Limitations performed on inverted BOSU no UE support    Stepping Strategy  UE support;Foam/compliant surface;Limitations    Stepping Strategy Limitations runners step from BOSU, 15x per LE    Other Standing Exercises Braiding in // bars with green band alt crossovers b/t front and back, 4 trips in bars               PT Short Term Goals - 06/22/20 1149      PT SHORT TERM GOAL #1   Title patient to tranfer STS under S with no physical assist    Baseline Min-Mod A to transfer STS; 05/19/20 able to STS transfer under S only; 06/22/20 Able to perform STS w/o need of UE assist    Time 2    Period Weeks    Status Achieved    Target Date 05/19/20      PT SHORT TERM GOAL #2   Title Patient to demo established HEP follwing development of written exercises based on patients abilities and deficits    Baseline no HEP; verbally reviewed exercises    Time 2    Period Weeks    Status Partially Met    Target Date 05/19/20      PT SHORT TERM GOAL #3   Title Patient to ambulate  141f x2 with RW and close S of PT    Baseline 1147fwith rollator and Min A of PT; ambulation of 11574fith SPC and CGA; 06/22/20 Able to ambulate 1000f63fth SPC and SBA/S    Time 2    Period Weeks    Status Achieved    Target Date 05/19/20      PT SHORT TERM GOAL #4   Title Assess DGI and MCTSIB and establish goals    Baseline not tested; MCTSIB able to hold 30s at all positions    Time 2    Period Days    Status Achieved    Target Date 05/19/20             PT Long Term Goals - 06/22/20 1632      PT LONG TERM GOAL #1   Title Ambulation of 1000ft42fh LRAD and S of PT across level ground    Baseline 115ft 26f RW and MinA of PT across level surface    Time 8    Period Weeks    Status Revised      PT LONG TERM GOAL #2   Title Patient to increase LLE strength to 4/5 grossly throughout    Baseline 3-3+/5 grossly throughout    Time 8    Period Weeks      PT LONG TERM GOAL #3   Title Patient to transfer and mobilize from all surfaces with ony distant S    Baseline patient transfers STS with Min A/CGA and CGA for bed mobility    Time 8    Period Weeks    Status New      PT LONG TERM GOAL #4   Title Assess progress towards DGI and MCTSIB goals    Baseline not set; 06/22/20 FGA goal is 26/30, no CTSIB goal needed    Time 8    Period Weeks    Status New                 Plan - 07/13/20 1155    Clinical Impression Statement Todays skilled session focused on strengthening and stabilization tasks to L hip and knee, ambulated into clinic and during session w/o need of cane, added supine weighted exercises to improve L hip strength and control as  well as TKE, began increased balance challenges on BOSU.  Continued to demo lack of TKE with gait despite good overall functional strength and mobility when challenged    Personal Factors and Comorbidities Comorbidity 3+    Comorbidities HTN, HLD, CVA    Examination-Activity Limitations Stand;Stairs    Examination-Participation  Restrictions Occupation    Stability/Clinical Decision Making Evolving/Moderate complexity    Rehab Potential Good    PT Frequency 2x / week    PT Duration 8 weeks    PT Treatment/Interventions DME Instruction;Gait training;Stair training;Functional mobility training;Therapeutic activities;Therapeutic exercise;Balance training;Neuromuscular re-education;Cognitive remediation;Patient/family education;Orthotic Fit/Training;Dry needling    PT Next Visit Plan continue to focus on control of LLE when pivoting through functional activities incorporating rotational activities at L hip    PT Home Exercise Plan issued [place] and Agree with Plan of Care Patient           Patient will benefit from skilled therapeutic intervention in order to improve the following deficits and impairments:  Abnormal gait,Decreased balance,Decreased endurance,Decreased mobility,Difficulty walking,Decreased cognition,Decreased knowledge of precautions,Impaired perceived functional ability,Decreased activity tolerance,Decreased coordination,Decreased safety awareness,Decreased strength  Visit Diagnosis: Unsteadiness on feet  Hemiplegia and hemiparesis following cerebral infarction affecting left dominant side (HCC)  Muscle weakness (generalized)  Cerebrovascular accident (CVA) due to other mechanism [facility])     Problem List Patient Active Problem List   Diagnosis Date Noted  . 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 Leukocytosis   . Tobacco abuse   . Prediabetes   . Dyslipidemia   . Tobacco dependence due to cigarettes 04/27/2020    Lanice Shirts  PT 07/13/2020, 4:42 PM  [facility] [address] [place], [place], [address] Phone: [phone]   Fax:  [phone]  Name: Timothy Baker MRN: [id number] Date of Birth: 03/02/1961

## 2020-07-13 NOTE — Therapy (Signed)
[facility] [address] [place], [place], [address] Phone: [phone]   Fax:  [phone]  Occupational Therapy Treatment  Patient Details  Name: Timothy Baker MRN: [id number] Date of Birth: 12/02/1960 Referring Provider (OT): Dr. Rosalin Hawking   Encounter Date: 07/13/2020   OT End of Session - 07/13/20 1231    Visit Number 9    Number of Visits 16    Date for OT Re-Evaluation 07/10/20    Authorization Type BC/BS - VL 30 combined PT/OT (Speech has 30)    Authorization - Visit Number 9    Authorization - Number of Visits 15    OT Start Time 3716    OT Stop Time 1314    OT Time Calculation (min) 44 min    Activity Tolerance Patient tolerated treatment well    Behavior During Therapy Impulsive           Past Medical History:  Diagnosis Date  . Polysubstance abuse ([place])    quit in 2019 - used ETOH, crack cocaine    Past Surgical History:  Procedure Laterality Date  . genital surgery    . LOOP RECORDER INSERTION N/A 05/01/2020   Procedure: LOOP RECORDER INSERTION;  Surgeon: Thompson Grayer, MD;  Location: [facility];  Service: Cardiovascular;  Laterality: N/A;    There were no vitals filed for this visit.    Cooking - made a side of rice with moderate verbal cues for safety (forgot to turn stove off), sequencing and problem solving (left heat on low). Pt reports he leaves stove on low while he goes and takes a shower, etc at home.   Small peg  With LUE for coordination and copying patter with good attention. Pt copied pattern with no cueing and slightly increased appropriate time - pt self-identified mistake and corrected errors.. No drops. Removed with in hand manipulation.  Constant Therapy Alternating Symbol level 4 with 91% accuracy and 39.82s response time. Pt with increased difficulty with finger isolation for pressing symbols on ipad and inattention to LUE. Gave dice to hold in LUE palm to  encourage pointer. Read a Map level 1 with 70% accuracy and 40.52s response time. Errors with E vs W and attention to details.                      OT Short Term Goals - 06/29/20 1136      OT SHORT TERM GOAL #1   Title Independent with HEP for Lt hand coordination and strength    Time 4    Period Weeks    Status Achieved      OT SHORT TERM GOAL #2   Title Independent with LUE shoulder HEP    Time 4    Period Weeks    Status Achieved      OT SHORT TERM GOAL #3   Title Pt to use Lt hand to feed self and groom 50% of the time    Baseline using Rt non dominant hand    Time 4    Period Weeks    Status Partially Met   100% w/ feeding per pt report, brushing teeth w/ Rt hand     OT SHORT TERM GOAL #4   Title Pt to make snack and perform light IADLS (washing dishes, folding towels) from standing position safely    Time 4    Period Weeks    Status Achieved   Met in clinic - pt  also reports sweeping and washing car     OT SHORT TERM GOAL #5   Title Pt to perform LE dressing at mod I level w/ A/E prn    Time 4    Period Weeks    Status Achieved      OT SHORT TERM GOAL #6   Title Pt to perform walker negotiation and environmental scanning through tight spaces w/ 85% accuracy and no bumps on Lt side    Time 4    Period Weeks    Status Achieved   found 12/16 using cane, 06/16/20: found 13/13 items (100%) w/ cane            OT Long Term Goals - 07/01/20 1300      OT LONG TERM GOAL #1   Title Pt to improve coordination Lt hand as evidenced by performing 9 hole peg test in under 40 sec    Baseline 49.44 sec    Time 8    Period Weeks    Status On-going      OT LONG TERM GOAL #2   Title Pt to improve grip strength Lt hand to 80 lbs or greater    Baseline 72 lbs (Rt = 81)    Time 8    Period Weeks    Status On-going      OT LONG TERM GOAL #3   Title Pt to return to simple cooking tasks and IADLS safely w/ DME/AE prn in prep for return to living alone     Time 8    Period Weeks    Status On-going   07/01/20: pt made egg w/ supervision and min to mod cueing required for safety     OT LONG TERM GOAL #4   Title Independent with memory compensatory strategies for medication management and paying bills    Time 8    Period Weeks    Status On-going      OT LONG TERM GOAL #5   Title Pt to use Lt dominant hand 90% for eating and grooming tasks    Time 8    Period Weeks    Status On-going                 Plan - 07/13/20 1231    Clinical Impression Statement Pt continues to progress. Pt continues to require cueing for safety and sequencing for cooking tasks.    OT Occupational Profile and History Detailed Assessment- Review of Records and additional review of physical, cognitive, psychosocial history related to current functional performance    Occupational performance deficits (Please refer to evaluation for details): ADL's;IADL's;Work    Body Structure / Function / Physical Skills ADL;Strength;Dexterity;Balance;Body mechanics;Proprioception;UE functional use;IADL;ROM;Endurance;Coordination;Mobility;FMC;Decreased knowledge of precautions    Cognitive Skills Attention;Safety Awareness;Memory;Perception    Rehab Potential Good    Clinical Decision Making Several treatment options, min-mod task modification necessary    Comorbidities Affecting Occupational Performance: May have comorbidities impacting occupational performance    Modification or Assistance to Complete Evaluation  Min-Moderate modification of tasks or assist with assess necessary to complete eval    OT Frequency 2x / week    OT Duration 8 weeks    OT Treatment/Interventions Self-care/ADL training;DME and/or AE instruction;Therapeutic activities;Aquatic Therapy;Therapeutic exercise;Cognitive remediation/compensation;Coping strategies training;Neuromuscular education;Functional Mobility Training;Passive range of motion;Visual/perceptual remediation/compensation;Patient/family  education;Manual Therapy;Moist Heat    Plan continue coordination, grip strength    Consulted and Agree with Plan of Care Patient           Patient will  benefit from skilled therapeutic intervention in order to improve the following deficits and impairments:   Body Structure / Function / Physical Skills: ADL,Strength,Dexterity,Balance,Body mechanics,Proprioception,UE functional use,IADL,ROM,Endurance,Coordination,Mobility,FMC,Decreased knowledge of precautions Cognitive Skills: Attention,Safety Awareness,Memory,Perception     Visit Diagnosis: Hemiplegia and hemiparesis following cerebral infarction affecting left dominant side (HCC)  Other lack of coordination  Unsteadiness on feet  Muscle weakness (generalized)    Problem List Patient Active Problem List   Diagnosis Date Noted  . 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 Leukocytosis   . Tobacco abuse   . Prediabetes   . Dyslipidemia   . Tobacco dependence due to cigarettes 04/27/2020    Zachery Conch MOT, OTR/L  07/13/2020, 1:40 PM  [facility] [address] [place], [place], [address] Phone: [phone]   Fax:  [phone]  Name: Timothy Baker MRN: [id number] Date of Birth: May 03, 1960

## 2020-07-14 ENCOUNTER — Encounter: Payer: BC Managed Care – PPO | Admitting: Occupational Therapy

## 2020-07-15 ENCOUNTER — Ambulatory Visit: Payer: BC Managed Care – PPO

## 2020-07-15 ENCOUNTER — Other Ambulatory Visit: Payer: Self-pay

## 2020-07-15 ENCOUNTER — Encounter: Payer: Self-pay | Admitting: Speech Pathology

## 2020-07-15 ENCOUNTER — Ambulatory Visit: Payer: BC Managed Care – PPO | Admitting: Occupational Therapy

## 2020-07-15 ENCOUNTER — Ambulatory Visit: Payer: BC Managed Care – PPO | Admitting: Speech Pathology

## 2020-07-15 DIAGNOSIS — R471 Dysarthria and anarthria: Secondary | ICD-10-CM

## 2020-07-15 DIAGNOSIS — M6281 Muscle weakness (generalized): Secondary | ICD-10-CM

## 2020-07-15 DIAGNOSIS — I69352 Hemiplegia and hemiparesis following cerebral infarction affecting left dominant side: Secondary | ICD-10-CM

## 2020-07-15 DIAGNOSIS — R2681 Unsteadiness on feet: Secondary | ICD-10-CM

## 2020-07-15 DIAGNOSIS — R278 Other lack of coordination: Secondary | ICD-10-CM

## 2020-07-15 DIAGNOSIS — I63411 Cerebral infarction due to embolism of right middle cerebral artery: Secondary | ICD-10-CM

## 2020-07-15 DIAGNOSIS — R41841 Cognitive communication deficit: Secondary | ICD-10-CM

## 2020-07-15 NOTE — Therapy (Signed)
[facility] [address] [place], [place], [address] Phone: [phone]   Fax:  [phone]  Occupational Therapy Treatment  Patient Details  Name: Timothy Baker MRN: [id number] Date of Birth: [birth year] Referring Provider (OT): Dr. Rosalin Hawking   Encounter Date: 07/15/2020   OT End of Session - 07/15/20 1309    Visit Number 10    Number of Visits 16    Date for OT Re-Evaluation 07/24/20   extended due to missed week   Authorization Type BC/BS - VL 30 combined PT/OT (Speech has 30)    Authorization - Visit Number 10    Authorization - Number of Visits 15    OT Start Time 4315    OT Stop Time 1312    OT Time Calculation (min) 42 min    Activity Tolerance Patient tolerated treatment well    Behavior During Therapy Impulsive           Past Medical History:  Diagnosis Date  . Polysubstance abuse ([place])    quit in 2019 - used ETOH, crack cocaine    Past Surgical History:  Procedure Laterality Date  . genital surgery    . LOOP RECORDER INSERTION N/A 05/01/2020   Procedure: LOOP RECORDER INSERTION;  Surgeon: Thompson Grayer, MD;  Location: [facility];  Service: Cardiovascular;  Laterality: N/A;    There were no vitals filed for this visit.   Subjective Assessment - 07/15/20 1249    Subjective  My arm gives out more    Pertinent History CVA 04/27/20 with Lt hemiplegia. PMH: HTN, HLD, substance abuse    Limitations loop recorder 05/01/20, no driving    Currently in Pain? No/denies           Pt reports occasional spills carrying cup of water Lt hand. Practiced in clinic w/ focus on attending to Lt side - pt w/ no spills when making conscious effort to focus on Lt side/hand. Pt also practiced carrying plate of "food" Lt hand w/ no drops/spills.   Pt noted to have mild weakness LUE especially with high range shoulder flexion. Pt issued theraband HEP for LUE strengthening (yellow resistance) and BUE sh  flexion ex w/ 3 lb dumbbell - pt required min cues to perform correctly. See pt instructions for details.   Assessed remaining goals and progress to date in prep for d/c. See goal section. Lt grip strength actually declined to 65 lbs, however pt reports he has not been doing putty HEP                       OT Short Term Goals - 07/15/20 1322      OT SHORT TERM GOAL #1   Title Independent with HEP for Lt hand coordination and strength    Time 4    Period Weeks    Status Achieved      OT SHORT TERM GOAL #2   Title Independent with LUE shoulder HEP    Time 4    Period Weeks    Status Achieved      OT SHORT TERM GOAL #3   Title Pt to use Lt hand to feed self and groom 50% of the time    Baseline using Rt non dominant hand    Time 4    Period Weeks    Status Achieved      OT SHORT TERM GOAL #4   Title Pt to make snack and perform light  IADLS (washing dishes, folding towels) from standing position safely    Time 4    Period Weeks    Status Achieved   Met in clinic - pt also reports sweeping and washing car     OT SHORT TERM GOAL #5   Title Pt to perform LE dressing at mod I level w/ A/E prn    Time 4    Period Weeks    Status Achieved      OT SHORT TERM GOAL #6   Title Pt to perform walker negotiation and environmental scanning through tight spaces w/ 85% accuracy and no bumps on Lt side    Time 4    Period Weeks    Status Achieved   found 12/16 using cane, 06/16/20: found 13/13 items (100%) w/ cane            OT Long Term Goals - 07/15/20 1319      OT LONG TERM GOAL #1   Title Pt to improve coordination Lt hand as evidenced by performing 9 hole peg test in under 40 sec    Baseline 49.44 sec    Time 8    Period Weeks    Status Achieved   30 sec     OT LONG TERM GOAL #2   Title Pt to improve grip strength Lt hand to 80 lbs or greater    Baseline 72 lbs (Rt = 81)    Time 8    Period Weeks    Status On-going   65 lbs     OT LONG TERM GOAL #3    Title Pt to return to simple cooking tasks and IADLS safely w/ DME/AE prn in prep for return to living alone    Time 8    Period Weeks    Status Partially Met   requires supervision for safety with cooking     OT LONG TERM GOAL #4   Title Independent with memory compensatory strategies for medication management and paying bills    Time 8    Period Weeks    Status Achieved   per pt report - he is doing I'ly     OT LONG TERM GOAL #5   Title Pt to use Lt dominant hand 90% for eating and grooming tasks    Time 8    Period Weeks    Status Achieved                 Plan - 07/15/20 1320    Clinical Impression Statement Pt has met all LTG's but grip strength goal - pt actually declined in this but reports has not been doing putty HEP. Pt also w/ Lt shoulder end range weakness and remaining mild inattention to Lt side.    OT Occupational Profile and History Detailed Assessment- Review of Records and additional review of physical, cognitive, psychosocial history related to current functional performance    Occupational performance deficits (Please refer to evaluation for details): ADL's;IADL's;Work    Body Structure / Function / Physical Skills ADL;Strength;Dexterity;Balance;Body mechanics;Proprioception;UE functional use;IADL;ROM;Endurance;Coordination;Mobility;FMC;Decreased knowledge of precautions    Cognitive Skills Attention;Safety Awareness;Memory;Perception    Rehab Potential Good    Clinical Decision Making Several treatment options, min-mod task modification necessary    Comorbidities Affecting Occupational Performance: May have comorbidities impacting occupational performance    Modification or Assistance to Complete Evaluation  Min-Moderate modification of tasks or assist with assess necessary to complete eval    OT Frequency 2x / week    OT  Duration 8 weeks    OT Treatment/Interventions Self-care/ADL training;DME and/or AE instruction;Therapeutic activities;Aquatic  Therapy;Therapeutic exercise;Cognitive remediation/compensation;Coping strategies training;Neuromuscular education;Functional Mobility Training;Passive range of motion;Visual/perceptual remediation/compensation;Patient/family education;Manual Therapy;Moist Heat    Plan review theraband HEP for Lt shoulder, reassess grip strength, anticipate d/c next session    Consulted and Agree with Plan of Care Patient           Patient will benefit from skilled therapeutic intervention in order to improve the following deficits and impairments:   Body Structure / Function / Physical Skills: ADL,Strength,Dexterity,Balance,Body mechanics,Proprioception,UE functional use,IADL,ROM,Endurance,Coordination,Mobility,FMC,Decreased knowledge of precautions Cognitive Skills: Attention,Safety Awareness,Memory,Perception     Visit Diagnosis: Hemiplegia and hemiparesis following cerebral infarction affecting left dominant side (HCC)  Other lack of coordination  Muscle weakness (generalized)    Problem List Patient Active Problem List   Diagnosis Date Noted  . 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 Leukocytosis   . Tobacco abuse   . Prediabetes   . Dyslipidemia   . Tobacco dependence due to cigarettes 04/27/2020    Carey Bullocks, OTR/L 07/15/2020, 1:23 PM  [facility] [address] [place], [place], [address] Phone: [phone]   Fax:  [phone]  Name: Timothy Baker MRN: [id number] Date of Birth: Nov 14, 1960

## 2020-07-15 NOTE — Therapy (Signed)
[facility] [address] [place], [place], [address] Phone: [phone]   Fax:  [phone]  Physical Therapy Treatment  Patient Details  Name: Timothy Baker MRN: [id number] Date of Birth: Feb 04, 1961 Referring Provider (PT): Seward Carol MD   Encounter Date: 07/15/2020   PT End of Session - 07/15/20 1151    Visit Number 15    Number of Visits 17    Authorization Type BCBS    Progress Note Due on Visit 20    PT Start Time 0932    PT Stop Time 1230    PT Time Calculation (min) 45 min    Equipment Utilized During Treatment Gait belt    Activity Tolerance Patient tolerated treatment well    Behavior During Therapy Impulsive           Past Medical History:  Diagnosis Date  . Polysubstance abuse ([place])    quit in 2019 - used ETOH, crack cocaine    Past Surgical History:  Procedure Laterality Date  . genital surgery    . LOOP RECORDER INSERTION N/A 05/01/2020   Procedure: LOOP RECORDER INSERTION;  Surgeon: Thompson Grayer, MD;  Location: [facility];  Service: Cardiovascular;  Laterality: N/A;    There were no vitals filed for this visit.   Subjective Assessment - 07/15/20 1150    Subjective Continues to report feeling "great", no changes or falls to note, was able to go grocery shopping w/o incident    Patient is accompained by: Family member    Pertinent History 60 yo male with onset of L side weakness sustained a fall climbing up to change smoke detector battery on 2/5, then on 2/6 came to ED with significant changes in his L side facially and in L extremities.  On imaging his notes were that the infarction was in R anterior limb of internal capsule.  Pt has memory changes, coordination changes and impulsive behavior.  PMHx:  substance abuse, HTN, HLD, tobacco use    Limitations Standing;Walking    How long can you sit comfortably? unlimited    How long can you stand comfortably? 5-10'    How long  can you walk comfortably? 5-10'    Patient Stated Goals To return to I ambulation and mobility    Currently in Pain? No/denies    Pain Onset In the past 7 days                             [facility] Adult PT Treatment/Exercise - 07/15/20 0001      Knee/Hip Exercises: Aerobic   Nustep 10' L6 arms 12      Knee/Hip Exercises: Supine   Short Arc Quad Sets Strengthening;Left;2 sets;15 reps;Limitations    Short Arc Quad Sets Limitations 4#    Bridges Strengthening;Left;2 sets;15 reps    Bridges Limitations LLE only 15x then alt. knee extension from bridged position    Single Leg Bridge Strengthening;Left;2 sets;15 reps    Other Supine Knee/Hip Exercises R sidelie clamshells 4#, 2x15    Other Supine Knee/Hip Exercises LLE marching, 2x15 4#               Balance Exercises - 07/15/20 0001      Balance Exercises: Standing   Standing Eyes Opened Wide (BOA);Head turns;Foam/compliant surface;Limitations    Standing Eyes Opened Limitations performed on inverted BOSU    Stepping Strategy Anterior;Foam/compliant surface;UE support;Limitations    Stepping Strategy Limitations  runners step from BOSU 15x per LE 4# wt on LLE    Other Standing Exercises Braiding in // bars alternating front and back crossovers, 4# on LLE               PT Short Term Goals - 06/22/20 1149      PT SHORT TERM GOAL #1   Title patient to tranfer STS under S with no physical assist    Baseline Min-Mod A to transfer STS; 05/19/20 able to STS transfer under S only; 06/22/20 Able to perform STS w/o need of UE assist    Time 2    Period Weeks    Status Achieved    Target Date 05/19/20      PT SHORT TERM GOAL #2   Title Patient to demo established HEP follwing development of written exercises based on patients abilities and deficits    Baseline no HEP; verbally reviewed exercises    Time 2    Period Weeks    Status Partially Met    Target Date 05/19/20      PT SHORT TERM GOAL #3   Title Patient  to ambulate 188f x2 with RW and close S of PT    Baseline 1177fwith rollator and Min A of PT; ambulation of 11548fith SPC and CGA; 06/22/20 Able to ambulate 1000f5fth SPC and SBA/S    Time 2    Period Weeks    Status Achieved    Target Date 05/19/20      PT SHORT TERM GOAL #4   Title Assess DGI and MCTSIB and establish goals    Baseline not tested; MCTSIB able to hold 30s at all positions    Time 2    Period Days    Status Achieved    Target Date 05/19/20             PT Long Term Goals - 06/22/20 1632      PT LONG TERM GOAL #1   Title Ambulation of 1000ft52fh LRAD and S of PT across level ground    Baseline 115ft 77f RW and MinA of PT across level surface    Time 8    Period Weeks    Status Revised      PT LONG TERM GOAL #2   Title Patient to increase LLE strength to 4/5 grossly throughout    Baseline 3-3+/5 grossly throughout    Time 8    Period Weeks      PT LONG TERM GOAL #3   Title Patient to transfer and mobilize from all surfaces with ony distant S    Baseline patient transfers STS with Min A/CGA and CGA for bed mobility    Time 8    Period Weeks    Status New      PT LONG TERM GOAL #4   Title Assess progress towards DGI and MCTSIB goals    Baseline not set; 06/22/20 FGA goal is 26/30, no CTSIB goal needed    Time 8    Period Weeks    Status New                 Plan - 07/15/20 1339    Clinical Impression Statement Todays rehab session consisted of continued LLE strengthening, gait, balance and endurance training, and high level SLS activities, still presenting with a mild lack of TKE in stance phase but able to demo full extension when cued.  L hip stability much improved as he is  able to maintain midline during stepping tasks    Personal Factors and Comorbidities Comorbidity 3+    Comorbidities HTN, HLD, CVA    Examination-Activity Limitations Stand;Stairs    Examination-Participation Restrictions Occupation    Stability/Clinical Decision  Making Evolving/Moderate complexity    Rehab Potential Good    PT Frequency 2x / week    PT Duration 8 weeks    PT Treatment/Interventions DME Instruction;Gait training;Stair training;Functional mobility training;Therapeutic activities;Therapeutic exercise;Balance training;Neuromuscular re-education;Cognitive remediation;Patient/family education;Orthotic Fit/Training;Dry needling    PT Next Visit Plan continue to focus on control of LLE when pivoting through functional activities incorporating rotational activities at L hip, consider incline retrowalking    PT Home Exercise Plan issued TKE [id number]    Consulted and Agree with Plan of Care Patient           Patient will benefit from skilled therapeutic intervention in order to improve the following deficits and impairments:  Abnormal gait,Decreased balance,Decreased endurance,Decreased mobility,Difficulty walking,Decreased cognition,Decreased knowledge of precautions,Impaired perceived functional ability,Decreased activity tolerance,Decreased coordination,Decreased safety awareness,Decreased strength  Visit Diagnosis: Unsteadiness on feet  Hemiplegia and hemiparesis following cerebral infarction affecting left dominant side (HCC)  Muscle weakness (generalized)  Cerebral infarction due to embolism of right middle cerebral artery [facility])     Problem List Patient Active Problem List   Diagnosis Date Noted  . 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 Leukocytosis   . Tobacco abuse   . Prediabetes   . Dyslipidemia   . Tobacco dependence due to cigarettes 04/27/2020    Lanice Shirts PT 07/15/2020, 1:56 PM  [facility] [address] [place], [place], [address] Phone: [phone]   Fax:  [phone]  Name: KYM FENTER MRN: [id number] Date of Birth: [birth year]

## 2020-07-15 NOTE — Patient Instructions (Signed)
   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

## 2020-07-15 NOTE — Patient Instructions (Signed)
    Strengthening: Resisted Flexion   Hold tubing with ___Lt__ arm(s) at side. Pull forward and up. Move shoulder through pain-free range of motion. Repeat __10__ times per set.  Do _2_ sessions per day , every other day   Strengthening: Resisted Extension   Hold tubing in __Lt___ hand(s), arm forward. Pull arm back, elbow straight. Repeat _10___ times per set. Do _2___ sessions per day, every other day.   Resisted Horizontal Abduction: Bilateral   Sit or stand, tubing in both hands, arms out in front. Keeping arms straight, pinch shoulder blades together and stretch arms out. Repeat _10___ times per set. Do _2___ sessions per day, every other day.   SHOULDER: Flexion Bilateral    Raise arms overhead at same speed. Keep elbows straight. Hold 3 lb dumbbell (palms facing, do not let elbows point out to side) _10__ reps per set, _2__ sets per day, every other day

## 2020-07-15 NOTE — Therapy (Signed)
[facility] [address] [place], [place], [address] Phone: [phone]   Fax:  [phone]  Speech Language Pathology Treatment & Discharge Summary  Patient Details  Name: Timothy Baker MRN: [id number] Date of Birth: 1960/08/10 Referring Provider (SLP): Dr. Rosalin Baker   Encounter Date: 07/15/2020   End of Session - 07/15/20 1149    Visit Number 14    Number of Visits 17    Date for SLP Re-Evaluation 07/16/20    Authorization Type BCBS    SLP Start Time 1101    SLP Stop Time  1141    SLP Time Calculation (min) 40 min    Activity Tolerance Patient tolerated treatment well           Past Medical History:  Diagnosis Date  . Polysubstance abuse ([place])    quit in 2019 - used ETOH, crack cocaine    Past Surgical History:  Procedure Laterality Date  . genital surgery    . LOOP RECORDER INSERTION N/A 05/01/2020   Procedure: LOOP RECORDER INSERTION;  Surgeon: Timothy Grayer, MD;  Location: [facility];  Service: Cardiovascular;  Laterality: N/A;    There were no vitals filed for this visit.   Subjective Assessment - 07/15/20 1110    Subjective "I don't text that much"    Currently in Pain? No/denies                 ADULT SLP TREATMENT - 07/15/20 1111      General Information   Behavior/Cognition Alert;Pleasant mood;Requires cueing      Treatment Provided   Treatment provided Cognitive-Linquistic      Cognitive-Linquistic Treatment   Treatment focused on Cognition;Dysarthria;Patient/family/caregiver education    Skilled Treatment Timothy Baker averaged 68 dB with occasional min A in structured task generating simple sentences. Timothy Baker scored a  18 on The Communicative Effectiveness Survey (brother not present to A in filling out). He scored a 1 on have a conversation when he is upset or angry, and he reports when this happends he "can't get a word out."  As Timothy Baker's goal is to go home, we role played  a 911 call with Timothy Baker he has had a stroke in the past and answered all questions intelligibly and accurately. He is managing his bills and goes home to check his mail and maintain the house. We discussed safety when he returns home, He Id'd a low light he will keep on in the bath room and a low light in his curio he can keep on at night in the TV room for saftey. He verbalizes the need to double check locks and stove before bed. He also verbalizes he would need a chair next to his grill if he is watching the grill. In 8 minute conversation, Timothy Baker was 95% intelliglble in a quiet environment, request for clarification 1x.      Assessment / Recommendations / Plan   Plan Discharge SLP treatment due to (comment)      Progression Toward Goals   Progression toward goals Goals met, education completed, patient discharged from SLP            SLP Education - 07/15/20 1143    Education Details night lights at home, use chair instead of standing for long period    Person(s) Educated Patient    Methods Explanation;Demonstration;Handout    Comprehension Verbalized understanding           SPEECH THERAPY DISCHARGE SUMMARY  Vis[facility] from [facility]  of Care: 14  Current functional level related to goals / functional outcomes: See goals below   Remaining deficits: Mild high level cognitive impairment; mild dysarthria   Education / Equipment: Compensations for cognition and dysarthria Plan: Patient agrees to discharge.  Patient goals were partially met. Patient is being discharged due to meeting the stated rehab goals.  ?????          SLP Short Term Goals - 07/15/20 1148      SLP SHORT TERM GOAL #1   Title Pt will complete HEP for dysarthria with occasional min A    Baseline 05-28-20    Period Weeks    Status Partially Met      SLP SHORT TERM GOAL #2   Title Pt will average 70dB on sentence responses 14/15x with occasional min A    Baseline 05-28-20    Period Weeks    Status  Achieved      SLP SHORT TERM GOAL #3   Title Pt will utilize compensations for memory to manage schedule, appointments and medication with occasional min A from family and friends    Baseline 06-02-20    Period Weeks    Status Partially Met      SLP SHORT TERM GOAL #4   Title Pt will verbalize 3 safety concerns in the home and generate solutions to them (emergent awareness) with occasional min A    Baseline 05-28-20    Period Weeks    Status Partially Met      SLP SHORT TERM GOAL #5   Title Pt will be 90% intelligible over 5 minute conversation in mildly noisy environment with occasional min A    Period Weeks    Status Not Met            SLP Long Term Goals - 07/15/20 1148      SLP LONG TERM GOAL #1   Title Pt will complete HEP for dysarthria with occasional min A    Time 1    Period Weeks    Status Not Met      SLP LONG TERM GOAL #2   Title Pt will average 70dB over 3 sentence utterances with occasional min A    Time 1    Period Weeks    Status Achieved      SLP LONG TERM GOAL #3   Title Pt will verbalize carryover of  anticipatory awareness for 3 safety situations in the home    Baseline 06/29/20; 07/01/20    Time 1    Period Weeks    Status Achieved      SLP LONG TERM GOAL #4   Title Pt will be 85% intellgible over 3 sentence utterance with occasional min A    Time 1    Period Weeks    Status Achieved      SLP LONG TERM GOAL #5   Title Score on Communicative Effectivess Survey will increase (family to fill out ) by 2 points.    Baseline 16    Time 1    Period Weeks    Status Achieved            Plan - 07/15/20 1143    Clinical Impression Statement Timothy Baker has improved anticiapatory awareness and safety awareness in regards to him returning home safely. He will have his nephew stay with him. He is reporting making safe decisions at home.  Volume and intelligilbity improved, however volume remains sub WNL, which is baseline per family. He is  95% intelligible  in short conversation in quiet environment. Goals met, education complete, d/c ST - pt in agreement    Speech Therapy Frequency 2x / week    Duration 8 weeks   17 visits   Treatment/Interventions Diet toleration management by SLP;Environmental controls;Cueing hierarchy;SLP instruction and feedback;Compensatory strategies;Functional tasks;Cognitive reorganization;Compensatory techniques;Internal/external aids;Multimodal communcation approach;Patient/family education    Potential to Achieve Goals Fair    Potential Considerations Cooperation/participation level;Previous level of function           Patient will benefit from skilled therapeutic intervention in order to improve the following deficits and impairments:   Dysarthria and anarthria  Cognitive communication deficit    Problem List Patient Active Problem List   Diagnosis Date Noted  . 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 Leukocytosis   . Tobacco abuse   . Prediabetes   . Dyslipidemia   . Tobacco dependence due to cigarettes 04/27/2020    Torien Ramroop, Annye Rusk MS, CCC-SLP 07/15/2020, 11:50 AM  [facility] [address] [place], [place], [address] Phone: [phone]   Fax:  [phone]   Name: Timothy Baker MRN: [id number] Date of Birth: 08/13/1960

## 2020-07-21 ENCOUNTER — Ambulatory Visit: Payer: BC Managed Care – PPO | Attending: Internal Medicine | Admitting: Occupational Therapy

## 2020-07-21 ENCOUNTER — Other Ambulatory Visit: Payer: Self-pay

## 2020-07-21 DIAGNOSIS — I69318 Other symptoms and signs involving cognitive functions following cerebral infarction: Secondary | ICD-10-CM | POA: Diagnosis present

## 2020-07-21 DIAGNOSIS — M6281 Muscle weakness (generalized): Secondary | ICD-10-CM | POA: Insufficient documentation

## 2020-07-21 DIAGNOSIS — R414 Neurologic neglect syndrome: Secondary | ICD-10-CM | POA: Diagnosis present

## 2020-07-21 DIAGNOSIS — I69352 Hemiplegia and hemiparesis following cerebral infarction affecting left dominant side: Secondary | ICD-10-CM | POA: Diagnosis present

## 2020-07-21 NOTE — Therapy (Signed)
[facility] [address] [place], [place], [address] Phone: [phone]   Fax:  [phone]  Occupational Therapy Treatment  Patient Details  Name: Timothy Baker MRN: [id number] Date of Birth: March 18, 1961 Referring Provider (OT): Dr. Rosalin Hawking   Encounter Date: 07/21/2020   OT End of Session - 07/21/20 1258    Visit Number 11    Number of Visits 16    Date for OT Re-Evaluation 07/24/20   extended due to missed week   Authorization Type BC/BS - VL 30 combined PT/OT (Speech has 85)    Authorization - Visit Number 11    Authorization - Number of Visits 15    OT Start Time 2426    OT Stop Time 1300    OT Time Calculation (min) 30 min    Activity Tolerance Patient tolerated treatment well    Behavior During Therapy Impulsive           Past Medical History:  Diagnosis Date  . Polysubstance abuse ([place])    quit in 2019 - used ETOH, crack cocaine    Past Surgical History:  Procedure Laterality Date  . genital surgery    . LOOP RECORDER INSERTION N/A 05/01/2020   Procedure: LOOP RECORDER INSERTION;  Surgeon: Thompson Grayer, MD;  Location: [facility];  Service: Cardiovascular;  Laterality: N/A;    There were no vitals filed for this visit.   Subjective Assessment - 07/21/20 1235    Subjective  The theraband ex's are going well.    Pertinent History CVA 04/27/20 with Lt hemiplegia. PMH: HTN, HLD, substance abuse    Limitations loop recorder 05/01/20, no driving    Currently in Pain? No/denies           Pt reports he may return to living alone - therapist cautioned he should not cook w/o direct supervision and should not be driving due to continued decreased attention. Pt issued driving evaluation info.  Reviewed theraband HEP - pt overall did well. Recommended pt perform horizontal abduction in front of a mirror to ensure equal movement in BUE's. Also recommended BUE sh flexion with 3 lb weight to only  head level seated, but could do higher if supine. Reinforced doing strengthening HEP every other day                     OT Education - 07/21/20 1236    Education Details driving evaluation info (pt not safe to drive but advised getting an evaluation before returning to driving)    Person(s) Educated Patient    Methods Explanation;Handout    Comprehension Verbalized understanding            OT Short Term Goals - 07/15/20 1322      OT SHORT TERM GOAL #1   Title Independent with HEP for Lt hand coordination and strength    Time 4    Period Weeks    Status Achieved      OT SHORT TERM GOAL #2   Title Independent with LUE shoulder HEP    Time 4    Period Weeks    Status Achieved      OT SHORT TERM GOAL #3   Title Pt to use Lt hand to feed self and groom 50% of the time    Baseline using Rt non dominant hand    Time 4    Period Weeks    Status Achieved      OT SHORT  TERM GOAL #4   Title Pt to make snack and perform light IADLS (washing dishes, folding towels) from standing position safely    Time 4    Period Weeks    Status Achieved   Met in clinic - pt also reports sweeping and washing car     OT SHORT TERM GOAL #5   Title Pt to perform LE dressing at mod I level w/ A/E prn    Time 4    Period Weeks    Status Achieved      OT SHORT TERM GOAL #6   Title Pt to perform walker negotiation and environmental scanning through tight spaces w/ 85% accuracy and no bumps on Lt side    Time 4    Period Weeks    Status Achieved   found 12/16 using cane, 06/16/20: found 13/13 items (100%) w/ cane            OT Long Term Goals - 07/21/20 1258      OT LONG TERM GOAL #1   Title Pt to improve coordination Lt hand as evidenced by performing 9 hole peg test in under 40 sec    Baseline 49.44 sec    Time 8    Period Weeks    Status Achieved   30 sec     OT LONG TERM GOAL #2   Title Pt to improve grip strength Lt hand to 80 lbs or greater    Baseline 72 lbs (Rt  = 81)    Time 8    Period Weeks    Status Not Met   65 lbs, 68 lbs     OT LONG TERM GOAL #3   Title Pt to return to simple cooking tasks and IADLS safely w/ DME/AE prn in prep for return to living alone    Time 8    Period Weeks    Status Partially Met   requires supervision for safety with cooking     OT LONG TERM GOAL #4   Title Independent with memory compensatory strategies for medication management and paying bills    Time 8    Period Weeks    Status Achieved   per pt report - he is doing I'ly     OT LONG TERM GOAL #5   Title Pt to use Lt dominant hand 90% for eating and grooming tasks    Time 8    Period Weeks    Status Achieved                 Plan - 07/21/20 1259    Clinical Impression Statement Pt has met all but grip strength LTG. Pt doing well but still needs supervision for cooking and do not recommend driving unless pt goes through driving evaluation.    OT Occupational Profile and History Detailed Assessment- Review of Records and additional review of physical, cognitive, psychosocial history related to current functional performance    Occupational performance deficits (Please refer to evaluation for details): ADL's;IADL's;Work    Body Structure / Function / Physical Skills ADL;Strength;Dexterity;Balance;Body mechanics;Proprioception;UE functional use;IADL;ROM;Endurance;Coordination;Mobility;FMC;Decreased knowledge of precautions    Cognitive Skills Attention;Safety Awareness;Memory;Perception    Rehab Potential Good    Clinical Decision Making Several treatment options, min-mod task modification necessary    Comorbidities Affecting Occupational Performance: May have comorbidities impacting occupational performance    Modification or Assistance to Complete Evaluation  Min-Moderate modification of tasks or assist with assess necessary to complete eval    OT Frequency 2x /  week    OT Duration 8 weeks    OT Treatment/Interventions Self-care/ADL training;DME  and/or AE instruction;Therapeutic activities;Aquatic Therapy;Therapeutic exercise;Cognitive remediation/compensation;Coping strategies training;Neuromuscular education;Functional Mobility Training;Passive range of motion;Visual/perceptual remediation/compensation;Patient/family education;Manual Therapy;Moist Heat    Plan D/C O.T.    Consulted and Agree with Plan of Care Patient           Patient will benefit from skilled therapeutic intervention in order to improve the following deficits and impairments:   Body Structure / Function / Physical Skills: ADL,Strength,Dexterity,Balance,Body mechanics,Proprioception,UE functional use,IADL,ROM,Endurance,Coordination,Mobility,FMC,Decreased knowledge of precautions Cognitive Skills: Attention,Safety Awareness,Memory,Perception     Visit Diagnosis: Hemiplegia and hemiparesis following cerebral infarction affecting left dominant side (HCC)  Muscle weakness (generalized)  Neurologic neglect syndrome  Other symptoms and signs involving cognitive functions following cerebral infarction    Problem List Patient Active Problem List   Diagnosis Date Noted  . 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 Leukocytosis   . Tobacco abuse   . Prediabetes   . Dyslipidemia   . Tobacco dependence due to cigarettes 04/27/2020    OCCUPATIONAL THERAPY DISCHARGE SUMMARY  Visits from Start of Care: 11  Current functional level related to goals / functional outcomes: See above   Remaining deficits: Strength Attention (overall but also to Lt side)    Education / Equipment: HEP, safety awareness, attending to Lt side  Plan: Patient agrees to discharge.  Patient goals were met. Patient is being discharged due to meeting the stated rehab goals.  ?????       Carey Bullocks, OTR/L 07/21/2020, 1:01 PM  [facility] [address] [place], [place], [address] Phone:  [phone]   Fax:  [phone]  Name: Timothy Baker MRN: [id number] Date of Birth: 09-07-1960

## 2020-07-21 NOTE — Patient Instructions (Signed)
Local Driver Evaluation Programs: ° °Comprehensive Evaluation: includes clinical and in vehicle behind the wheel testing by OCCUPATIONAL THERAPIST. Programs have varying levels of adaptive controls available for trial.  ° °[facility] °[address] °[place], [place]  [address] °[phone] or [phone] °http://www.driver-rehab.com °Evaluator:  Cyndee Crompton, OT/CDRS/CDI/SCDCM/Low Vision Certification ° °[facility] °[address] °[place], [place] [address] °[phone] °https://www.[facility].org/home/services/rehabilitation.aspx °Evaluators:  Shannon Sheek, OT and Jill Tucker, OT ° °[facility] [place] [facility] (ONLY SERVES VETERANS!!) °[facility] °[address] °[place], [place]  [address] °[phone] [phone] °http://www.[place].va.gov/services/Physical_Medicine_Rehabilitation_Services.asp °Evaluators:  Eric Andrews, KT; Heidi Harris, KT;  Gary Whitaker, KT (KT=kiniesotherapist) ° ° °Clinical evaluations only:  Includes clinical testing, refers to other programs or local certified driving instructor for behind the wheel testing. ° °[facility] (outpatient Rehab) °[facility] °[address] °[place],  [address] °[phone] for scheduling °http://www.[facility].edu/Outpatient-Rehabilitation/Neurorehabilitation-Therapy.htm °Evaluators:  Andelyn Spade [place], OT; Kate Phillips, OT ° °Other area clinical evaluators available upon request including [facility], [facility] and [facility]. ° ° °    Resource List °What is a Driver Evaluation: °Your Road Ahead - A Guide to Comprehensive Driving Evaluations °http://www.thehartford.com/resources/mature-market-excellence/publications-on-aging ° °Association for [employer] - Disability and Driving Fact Sheets °http://www.aded.net/?page=510 ° °Driving after a Brain  Injury: °[employer] °http://www.biausa.org/tbims-abstracts/if-there-is-an-effective-way-to-determine-if-someone-is-ready-to-drive-after-tbi?A=SearchResult&SearchID=9495675&ObjectID=2758842&ObjectType=35 ° °Driving with Adaptive Equipment: °[facility] °http://www.driver-rehab.com/adaptive-equipment ° °[employer] °http://www.nmeda.com/ ° ° ° ° ° ° °  °

## 2020-08-11 ENCOUNTER — Other Ambulatory Visit: Payer: Self-pay | Admitting: Physician Assistant

## 2020-08-11 DIAGNOSIS — F17200 Nicotine dependence, unspecified, uncomplicated: Secondary | ICD-10-CM

## 2020-08-13 LAB — CUP PACEART REMOTE DEVICE CHECK
Date Time Interrogation Session: 20220525193042
Implantable Pulse Generator Implant Date: 20220211

## 2020-08-18 ENCOUNTER — Ambulatory Visit (INDEPENDENT_AMBULATORY_CARE_PROVIDER_SITE_OTHER): Payer: BC Managed Care – PPO

## 2020-08-18 DIAGNOSIS — I639 Cerebral infarction, unspecified: Secondary | ICD-10-CM | POA: Diagnosis not present

## 2020-09-01 ENCOUNTER — Ambulatory Visit
Admission: RE | Admit: 2020-09-01 | Discharge: 2020-09-01 | Disposition: A | Discharge status: Home / Self Care | Payer: BC Managed Care – PPO | Source: Ambulatory Visit | Attending: Physician Assistant | Admitting: Physician Assistant

## 2020-09-01 ENCOUNTER — Other Ambulatory Visit: Payer: Self-pay

## 2020-09-01 DIAGNOSIS — F17200 Nicotine dependence, unspecified, uncomplicated: Secondary | ICD-10-CM

## 2020-09-10 NOTE — Progress Notes (Signed)
[facility] Summary Report / Loop Recorder 

## 2020-09-14 ENCOUNTER — Encounter: Payer: Self-pay | Admitting: Adult Health

## 2020-09-14 ENCOUNTER — Ambulatory Visit: Payer: BC Managed Care – PPO | Admitting: Adult Health

## 2020-09-14 ENCOUNTER — Telehealth: Payer: Self-pay

## 2020-09-14 ENCOUNTER — Other Ambulatory Visit: Payer: Self-pay

## 2020-09-14 VITALS — BP 124/77 | HR 72 | Ht 75.0 in | Wt 190.0 lb

## 2020-09-14 DIAGNOSIS — E785 Hyperlipidemia, unspecified: Secondary | ICD-10-CM

## 2020-09-14 DIAGNOSIS — I639 Cerebral infarction, unspecified: Secondary | ICD-10-CM | POA: Diagnosis not present

## 2020-09-14 DIAGNOSIS — G8194 Hemiplegia, unspecified affecting left nondominant side: Secondary | ICD-10-CM

## 2020-09-14 DIAGNOSIS — Z87891 Personal history of nicotine dependence: Secondary | ICD-10-CM

## 2020-09-14 DIAGNOSIS — I69398 Other sequelae of cerebral infarction: Secondary | ICD-10-CM

## 2020-09-14 DIAGNOSIS — I69319 Unspecified symptoms and signs involving cognitive functions following cerebral infarction: Secondary | ICD-10-CM

## 2020-09-14 DIAGNOSIS — R269 Unspecified abnormalities of gait and mobility: Secondary | ICD-10-CM

## 2020-09-14 NOTE — Telephone Encounter (Signed)
Referral for neuro psychology sent to [facility]. P: [phone].

## 2020-09-14 NOTE — Progress Notes (Signed)
[facility] [address] [place] [id number] [phone]       STROKE FOLLOW UP NOTE  Mr. Timothy PresumeFrederick E Baker Date of Birth:  08/01/1960 Medical Record Number:  [id number]   Reason for Referral: stroke follow up    SUBJECTIVE:   CHIEF COMPLAINT:  Chief Complaint  Patient presents with   Follow-up    TR  Pt is well, has some L sided weakness/dragging      HPI:   Today, 09/14/2020, [name] returns for 6171-month stroke follow-up unaccompanied  Reports residual left sided weakness with left foot dragging, gait impairment, dysarthria, and cognitive difficulties.  He has not yet returned back to work and is requesting assistance with short-term disability - previously working as a Estate agentforklift operator Completed therapies with OT reporting residual decreased left dominant hand grip strength and decreased left-sided inattention and PT reporting residual left hemiparesis and gait impairment with imbalance He has since returned back to living on his own - he is able to prepare his own meals with microwave and air fryer - does not use stove - pays own bills and does own medications without difficulties - has returned back to driving without difficulty although per review OT notes, they recommended driving evaluation due to cognitive impairment and inattention deficits -this was not pursued due to financial reasons  Compliant on aspirin and atorvastatin without associated side effects Blood pressure today 124/77 Continues tobacco cessation  Loop recorder has not shown atrial fibrillation thus far  TEE not yet completed as currently waiting for dental surgery - has appointment with oral surgeon next month  No further concerns at this time  History provided for reference purposes only Initial visit 06/02/2020 JM: 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 Denies new or worsening stroke/TIA symptoms   Remains on aspirin and Plavix despite 3-week recommendation but denies side effects Compliant on atorvastatin 40 mg daily without myalgias Blood pressure 118/73 Reports complete tobacco cessation since discharge -continues to use NicoDerm patches  Loop recorder has not shown atrial fibrillation thus far  TEE placed on hold until follow-up with dentistry for poor dentition -currently in the process of following up   Stroke admission 04/27/2020  Timothy Baker is a  60 year old male with only known medical history of tobacco use and borderline HTN who presented on 04/27/2020 with left-sided weakness, left facial droop, and dysarthria for approximately 3 days.  Personally reviewed hospitalization pertinent progress notes, lab work and imaging with summary provided.  Evaluated by Dr. Roda ShuttersXu with stroke work-up revealing patchy multifocal acute infarcts involving the right CR, So and BG, embolic pattern secondary to unclear source.  Recommend placement of loop recorder outpatient to assess for atrial fibrillation.  Plans for TEE outpatient.  Recommended DAPT for 3 weeks and aspirin alone.  LDL 107 -initiate atorvastatin 40 mg daily.  Current tobacco use with smoking cessation counseling provided.  No prior stroke history.  Evaluated by therapies who initially recommended CIR but eventually discharged home with recommended outpatient therapies as CIR unable to accept patient.  Stroke - patchy multifocal acute infarcts involving the right CR, SO and BG, embolic pattern, source  unclear CT head Recent appearing and suspected acute infarct involving a portion ofthe anterior limb of the right internal capsule and immediately adjacent right centrum semiovale.   CTA head & neck: Normal CTA of the head and neck. MRI  Patchy multifocal acute ischemic nonhemorrhagic infarcts involving the periventricular and deep white matter of the rightcorona radiata and centrum semi ovale, with involvement of the underlying right basal ganglia.  2D Echo EF 60 to 65%. No shunt.  LE venous doppler - no DVT TEE will be done as outpt once pt tooth condition improves to avoid aspiration TCD bubble study no DVT Recommend loop recorder placement to rule out afib  LDL 107 HgbA1c 5.9 UDS neg Hypercoagulable lab unremarkable VTE prophylaxis -Lovenox No antithrombotic prior to admission, now on aspirin 81 mg daily and clopidogrel 75 mg daily DAPT for 3 weeks and then aspirin alone. Therapy recommendations: CIR --> OP PT/OT/SLP Disposition: home      ROS:   14 system review of systems performed and negative with exception of those listed in HPI  PMH:  Past Medical History:  Diagnosis Date   Polysubstance abuse (HCC)    quit in 2019 - used ETOH, crack cocaine    PSH:  Past Surgical History:  Procedure Laterality Date   genital surgery     LOOP RECORDER INSERTION N/A 05/01/2020   Procedure: LOOP RECORDER INSERTION;  Surgeon: Hillis Range, MD;  Location: [facility] INVASIVE CV LAB;  Service: Cardiovascular;  Laterality: N/A;    Social History:  Social History   Socioeconomic History   Marital status: Single    Spouse name: Not on file   Number of children: Not on file   Years of education: Not on file   Highest education level: Not on file  Occupational History   Not on file  Tobacco Use   Smoking status: Some Days    Packs/day: 0.50    Years: 45.00    Pack years: 22.50    Types: Cigarettes   Smokeless tobacco: Never  Substance and Sexual Activity   Alcohol use: Not Currently     Comment: quit 4 years ago (2018)   Drug use: Not Currently    Types: "Crack" cocaine    Comment: quit 2018-2019   Sexual activity: Not on file  Other Topics Concern   Not on file  Social History Narrative   Not on file   Social Determinants of Health   Financial Resource Strain: Not on file  Food Insecurity: Not on file  Transportation Needs: Not on file  Physical Activity: Not on file  Stress: Not on file  Social Connections: Not on file  Intimate Partner Violence: Not on file    Family History:  Family History  Problem Relation Age of Onset   Stroke Neg Hx     Medications:   Current Outpatient Medications on File Prior to Visit  Medication Sig Dispense Refill   aspirin 81 MG EC tablet Take 1 tablet (81 mg total) by mouth daily. Swallow whole. 150 tablet 0   atorvastatin (LIPITOR) 40 MG tablet Take 1 tablet (40 mg total) by mouth daily. 90 tablet 3   nicotine (NICODERM CQ - DOSED IN MG/24 HOURS) 14 mg/24hr patch Place 1 patch (14 mg total) onto the skin daily. 28 patch 0   No current facility-administered medications on file prior to visit.    Allergies:  No Known  Allergies    OBJECTIVE:  Physical Exam  Vitals:   09/14/20 0803  BP: 124/77  Pulse: 72  Weight: 190 lb (86.2 kg)  Height: 6\' 3"  (1.905 m)    Body mass index is 23.75 kg/m. No results found.  General: well developed, well nourished,  pleasant middle-aged African-American male, seated, in no evident distress Head: head normocephalic and atraumatic.   Neck: supple with no carotid or supraclavicular bruits Cardiovascular: regular rate and rhythm, no murmurs Musculoskeletal: no deformity Skin:  no rash/petichiae Vascular:  Normal pulses all extremities   Neurologic Exam Mental Status: Awake and fully alert. Mild to moderate dysarthria. No evidence of aphasia.  Oriented to place and time. Recent and remote memory intact. Attention span, concentration and fund of knowledge appears appropriate  during visit. Mood and affect appropriate. Recall: 2/3. 4 legged animal naming 19 in 60 seconds. Serial addition good.  Clock drawing 4/4 Cranial Nerves: Pupils equal, briskly reactive to light. Extraocular movements full without nystagmus. Visual fields full to confrontation. Hearing intact. Facial sensation intact.  Left lower facial weakness.  Tongue and palate moves normally and symmetrically.  Motor: Normal strength, bulk and tone on right side. LUE: 4/5 deltoid, EF and hand grip LLE: 4/5 hip flexor otherwise 5/5 Sensory.: intact to touch , pinprick , position and vibratory sensation.  Coordination: Rapid alternating movements normal in all extremities except decreased left hand. Finger-to-nose and heel-to-shin performed accurately on right side with mild incoordination left side.  Orbits right arm and left arm Gait and Station: Arises from chair without difficulty. Stance is normal. Gait demonstrates  slightly decreased stride length and step height LLE with mild imbalance and use of cane.  Tandem walk and heel toe not attempted.  Reflexes: 1+ and symmetric. Toes downgoing.        ASSESSMENT: DAMONDRE PFEIFLE is a 60 y.o. year old male presented with 3-day history of left-sided weakness, left facial droop and dysarthria on 04/27/2020 with stroke work-up revealing patchy multifocal acute infarcts involving R CR, So and BG, embolic pattern secondary to unclear source. Vascular risk factors include HLD and tobacco use.     PLAN:  Cryptogenic stroke:  Residual deficit: Left hemiparesis, gait impairment, mild cognitive impairment and dysarthria. Will assist with completion of short term disability which was initially started during hospitalization (per patient, PCP requests our office to complete) as he would likely have great difficulty returning as 06/25/2020 with residual deficits.  Referral placed for neurocognitive evaluation as well as discussion of vocational rehab but he wishes  to speak to his current employer to see if there are other job options available.  Discussed concern regarding him currently driving and OT recommendations -highly encourage driving evaluation through either driving rehab or DMV - he plans on contacting DMV to possibly complete driving evaluation.  Continue to use cane for fall prevention.  Encouraged continued HEP which was advised at completion of therapy sessions Loop recorder has not shown atrial fibrillation thus far (personally reviewed monthly reports) -monitored by cardiology Plan on TEE once dental work completed - has appt with oral surgeon next month Continue aspirin 81 mg daily  and atorvastatin 40 mg daily for secondary stroke prevention Discussed secondary stroke prevention measures and importance of close PCP follow up for aggressive stroke risk factor management  HLD: LDL goal <70.  Prior LDL 107 -repeat lipid panel today.  Continue atorvastatin 40 mg daily -request refills and ongoing monitoring and management of cholesterol per PCP Tobacco use: Reports  complete tobacco cessation since discharge -highly encouraged continued abstinence    Follow up in 4 months or call earlier if needed   CC:  [facility] provider: Dr. Pearlean Brownie PCP: Delma Officer, [place]     Ihor Austin, [facility]  [facility] [address] [place], [place] [address]  Phone [phone] Fax [phone] Note: This document was prepared with digital dictation and possible smart phrase technology. Any transcriptional errors that result from this process are unintentional.

## 2020-09-14 NOTE — Patient Instructions (Addendum)
Continue aspirin 81 mg daily  and atorvastatin 40mg  daily  for secondary stroke prevention  Continue to follow up with PCP regarding cholesterol management  Maintain strict control of cholesterol with LDL cholesterol (bad cholesterol) goal below 70 mg/dL.   Would recommend evaluation with driving rehab to ensure complete safety with return back to driving as recommended by occupational therapy - if unable to complete financially, would recommend reaching out to your DMV who can also do a driving evaluation     Followup in the future with me in 4 months or call earlier if needed     Thank you for coming to see at [facility]. I hope we have been able to provide you high quality care today.  You may receive a patient satisfaction survey over the next few weeks. We would appreciate your feedback and comments so that we may continue to improve ourselves and the health of our patients.

## 2020-09-15 ENCOUNTER — Ambulatory Visit (INDEPENDENT_AMBULATORY_CARE_PROVIDER_SITE_OTHER): Payer: BC Managed Care – PPO

## 2020-09-15 DIAGNOSIS — I639 Cerebral infarction, unspecified: Secondary | ICD-10-CM

## 2020-09-15 LAB — LIPID PANEL
Chol/HDL Ratio: 2.9 ratio (ref 0.0–5.0)
Cholesterol, Total: 106 mg/dL (ref 100–199)
HDL: 37 mg/dL — ABNORMAL LOW (ref >39)
LDL Chol Calc (NIH): 53 mg/dL (ref 0–99)
Triglycerides: 81 mg/dL (ref 0–149)
VLDL Cholesterol Cal: 16 mg/dL (ref 5–40)

## 2020-09-15 LAB — CUP PACEART REMOTE DEVICE CHECK
Date Time Interrogation Session: 20220627192531
Implantable Pulse Generator Implant Date: 20220211

## 2020-09-16 ENCOUNTER — Telehealth: Payer: Self-pay | Admitting: *Deleted

## 2020-09-16 NOTE — Telephone Encounter (Signed)
Given to Timothy Baker in MR if pt returns call.

## 2020-09-16 NOTE — Telephone Encounter (Signed)
Received form and this looks like his insured statement not attending physician;s statement.  I LMVM for him to call me back.

## 2020-09-18 NOTE — Progress Notes (Signed)
I agree with the above plan 

## 2020-09-24 NOTE — Telephone Encounter (Signed)
Completed and placed in out box.  Thank you. 

## 2020-09-24 NOTE — Telephone Encounter (Signed)
Received physician's attending statement completed, to JM/NP for review, completion and signature.

## 2020-09-24 NOTE — Telephone Encounter (Signed)
Completed form has been sent to medical records for processing. Copy made for filing as well.

## 2020-09-30 ENCOUNTER — Telehealth: Payer: Self-pay | Admitting: *Deleted

## 2020-09-30 NOTE — Telephone Encounter (Signed)
Pt USable life form faxed on 09/24/20

## 2020-10-06 NOTE — Progress Notes (Signed)
[facility] Summary Report / Loop Recorder 

## 2020-10-09 NOTE — Addendum Note (Signed)
Addended by: Geralyn Flash D on: 10/09/2020 09:04 AM   Modules accepted: Level of Service

## 2020-10-13 ENCOUNTER — Telehealth: Payer: Self-pay | Admitting: Adult Health

## 2020-10-13 NOTE — Telephone Encounter (Signed)
Pt is asking for a call back to discuss him being able to go back to work since he will not be doing any lifting, please call.

## 2020-10-13 NOTE — Telephone Encounter (Signed)
I called the pt back and we discussed message.  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   Pt was advised for [place] to appropriately clear him for work it would be beneficial to have his specific job duties.   Pt sts he can get this information and will fax to the  [phone].#

## 2020-10-13 NOTE — Telephone Encounter (Signed)
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  There was also concern of cognitive impairment for which neuropsych eval referral placed.  He may be beneficial for him to do a functional capacity evaluation to further evaluate safety for him to return back to work.  Only able to get further job descriptions on what he plans on doing if he were to return now?

## 2020-10-19 ENCOUNTER — Ambulatory Visit (INDEPENDENT_AMBULATORY_CARE_PROVIDER_SITE_OTHER): Payer: BC Managed Care – PPO

## 2020-10-19 DIAGNOSIS — I639 Cerebral infarction, unspecified: Secondary | ICD-10-CM

## 2020-10-19 LAB — CUP PACEART REMOTE DEVICE CHECK
Date Time Interrogation Session: 20220730192833
Implantable Pulse Generator Implant Date: 20220211

## 2020-11-03 ENCOUNTER — Telehealth: Payer: Self-pay | Admitting: Adult Health

## 2020-11-03 NOTE — Telephone Encounter (Signed)
Contacted pt back, he stated his job is requesting a letter stating he can come back to work and any limitation on what pt can and can not do. I informed him that they need to send [place] over a form to release him or give any limitations that can be dropped off or faxed to [place].  Fax number provided

## 2020-11-03 NOTE — Telephone Encounter (Signed)
Contacted pt, LVM to return call

## 2020-11-03 NOTE — Telephone Encounter (Signed)
Contacted pt again, no answer  

## 2020-11-03 NOTE — Telephone Encounter (Signed)
Pt called stating his job is pressuring him to come back to work and he is needing some information regarding whether or not he should go back from [place] NP. Pt is requesting a call back.

## 2020-11-04 NOTE — Telephone Encounter (Addendum)
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 He wants to be called when paper work is ready. I advised will let NP know and he'll get a call. Patient verbalized understanding, appreciation.

## 2020-11-04 NOTE — Telephone Encounter (Signed)
Trena Platt HR Administrator with [employer] called stating they do not have a form to send [place] for work restrictions for the pt. She states we would have to write up a letter clarifying what the pt can and cannot do. She states it would be best to reach out to the corporate office and speak with Katha Hamming at [phone]. If you have any questions for Zella Ball please give her a call at [phone].

## 2020-11-04 NOTE — Telephone Encounter (Signed)
Contacted pt corporate office Katha Hamming at [phone], she informed me that she will fax his specific job function to me so we can get his letter made with any restrictions.  Fax number [phone] provided.

## 2020-11-04 NOTE — Telephone Encounter (Signed)
Per prior telephone note, additional information is needed regarding his new role and job duties. I cannot write a letter with specific restrictions without knowing his new role. Thank you    Sunday Spillers, RN Note I called the pt back and we discussed message.  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    Pt was advised for [place] to appropriately clear him for work it would be beneficial to have his specific job duties.    Pt sts he can get this information and will fax to the  [phone].#

## 2020-11-04 NOTE — Telephone Encounter (Signed)
Please advise 

## 2020-11-04 NOTE — Telephone Encounter (Signed)
Contacted Robin back, informed her that per notes from 10/13/20, pt was suppose to provide [place] with his job function and so we would be able to know if restrictions are needed or not. She advised for me to FU with him again or call the corporate office.   I contacted pt LVM for him to return my call

## 2020-11-05 NOTE — Telephone Encounter (Signed)
Contacted corporate office to [employer] on fax of job duties. No answer

## 2020-11-10 ENCOUNTER — Telehealth: Payer: Self-pay | Admitting: Adult Health

## 2020-11-10 NOTE — Telephone Encounter (Signed)
Fax received with pt job duties, placed on [employer] for review

## 2020-11-10 NOTE — Telephone Encounter (Signed)
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

## 2020-11-10 NOTE — Telephone Encounter (Signed)
Patient requesting letter for back to work stating what he can and cannot do for job duties. Best call back is [phone]

## 2020-11-11 NOTE — Progress Notes (Signed)
[facility] Summary Report / Loop Recorder 

## 2020-11-11 NOTE — Telephone Encounter (Signed)
PHONE RM CAN RELAY Contacted pt back, got disconnected.  LVM yesterday informing him his Danne Harbor at his corporate office just Fax [place] pt job duties, was placed on [employer] for review to complete letter. Waiting on her to complete.

## 2020-11-11 NOTE — Telephone Encounter (Signed)
Pt called requesting a call back.

## 2020-11-11 NOTE — Telephone Encounter (Signed)
Received job duty report from his current employer.  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   As noted in recent visit, completed therapies with OT reporting residual decreased left dominant hand grip strength and decreased left-sided inattention and PT reporting residual left hemiparesis and gait impairment with imbalance.  He continues to use a cane for assistance.  He would likely have great difficulty carrying out most if not all of the listed duties. There would be a great safety concern with him returning back to work with these job duties.   As I discussed with patient at recent visit, I would recommend evaluation for functional capacity evaluation to see what specific activities he can and cannot do who can provide better insight. Please let me know if patient would like this evaluation.

## 2020-11-12 NOTE — Telephone Encounter (Signed)
Lvm asking pt to return call.

## 2020-11-24 ENCOUNTER — Ambulatory Visit (INDEPENDENT_AMBULATORY_CARE_PROVIDER_SITE_OTHER): Payer: BC Managed Care – PPO

## 2020-11-24 DIAGNOSIS — I639 Cerebral infarction, unspecified: Secondary | ICD-10-CM | POA: Diagnosis not present

## 2020-11-25 LAB — CUP PACEART REMOTE DEVICE CHECK
Date Time Interrogation Session: 20220901193007
Implantable Pulse Generator Implant Date: 20220211

## 2020-12-03 NOTE — Progress Notes (Signed)
[facility] Summary Report / Loop Recorder 

## 2020-12-16 ENCOUNTER — Ambulatory Visit: Payer: BC Managed Care – PPO | Admitting: Adult Health

## 2020-12-16 ENCOUNTER — Encounter: Payer: Self-pay | Admitting: Adult Health

## 2020-12-16 NOTE — Progress Notes (Deleted)
[facility] [address] [place]. [place] [address] [phone]       STROKE FOLLOW UP NOTE  Timothy Baker Date of Birth:  Jun 08, 1960 Medical Record Number:  [id number]   Reason for Referral: stroke follow up    SUBJECTIVE:   CHIEF COMPLAINT:  No chief complaint on file.    HPI:   Today, 12/16/2020, Timothy Baker returns for stroke follow-up regarding cryptogenic stroke on 04/27/2020.  Overall stable.  Reports residual ***.  He is requesting to return back to work (see telephone note 11/03/2020) for full details.   Compliant on aspirin and atorvastatin without side effects.  Blood pressure today ***.  Continued tobacco cessation.  Loop recorder has not shown atrial fibrillation thus far.  Dental surgery ***.       History provided for reference purposes only Update 09/14/2020 JM Timothy Baker returns for 98-month stroke follow-up unaccompanied  Reports residual left sided weakness with left foot dragging, gait impairment, dysarthria, and cognitive difficulties.  He has not yet returned back to work and is requesting assistance with short-term disability - previously working as a Estate agent Completed therapies with OT reporting residual decreased left dominant hand grip strength and decreased left-sided inattention and PT reporting residual left hemiparesis and gait impairment with imbalance He has since returned back to living on his own - he is able to prepare his own meals with microwave and air fryer - does not use stove - pays own bills and does own medications without difficulties - has returned back to driving without difficulty although per review OT notes, they recommended driving evaluation due to cognitive impairment and inattention deficits -this was not pursued due to financial reasons  Compliant on aspirin and atorvastatin without associated side effects Blood pressure today 124/77 Continues tobacco cessation  Loop recorder has not shown  atrial fibrillation thus far  TEE not yet completed as currently waiting for dental surgery - has appointment with oral surgeon next month  No further concerns at this time  Initial visit 06/02/2020 JM: Timothy Baker is being seen for hospital follow-up accompanied by his sister  Reports residual left-sided weakness, dysarthria, balance difficulties and cognitive impairment Currently working with neuro rehab PT/OT/SLP and reports ongoing improvement Currently living with mother and sister - plans on returning back home once therapy completed - able to maintain ADL's independently now Use of cane when outdoors - denies any recent falls Has returned back to work currently on short term disability - 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 Denies new or worsening stroke/TIA symptoms   Remains on aspirin and Plavix despite 3-week recommendation but denies side effects Compliant on atorvastatin 40 mg daily without myalgias Blood pressure 118/73 Reports complete tobacco cessation since discharge -continues to use NicoDerm patches  Loop recorder has not shown atrial fibrillation thus far  TEE placed on hold until follow-up with dentistry for poor dentition -currently in the process of following up   Stroke admission 04/27/2020  Timothy Baker with only known medical history of tobacco use and borderline HTN who presented on 04/27/2020 with left-sided weakness, left facial droop, and dysarthria for approximately 3 days.  Personally reviewed hospitalization pertinent progress notes, lab work and imaging with summary provided.  Evaluated by Dr. Roda Shutters with stroke work-up revealing patchy multifocal acute infarcts involving the right CR, So and BG, embolic pattern secondary to unclear source.  Recommend placement of loop recorder outpatient to assess for atrial fibrillation.  Plans for TEE outpatient.  Recommended DAPT for 3 weeks and aspirin alone.  LDL 107 -initiate atorvastatin 40  mg daily.  Current tobacco use with smoking cessation counseling provided.  No prior stroke history.  Evaluated by therapies who initially recommended CIR but eventually discharged home with recommended outpatient therapies as CIR unable to accept patient.  Stroke - patchy multifocal acute infarcts involving the right CR, SO and BG, embolic pattern, source unclear CT head Recent appearing and suspected acute infarct involving a portion ofthe anterior limb of the right internal capsule and immediately adjacent right centrum semiovale.   CTA head & neck: Normal CTA of the head and neck. MRI  Patchy multifocal acute ischemic nonhemorrhagic infarcts involving the periventricular and deep white matter of the rightcorona radiata and centrum semi ovale, with involvement of the underlying right basal ganglia.  2D Echo EF 60 to 65%. No shunt.  LE venous doppler - no DVT TEE will be done as outpt once pt tooth condition improves to avoid aspiration TCD bubble study no DVT Recommend loop recorder placement to rule out afib  LDL 107 HgbA1c 5.9 UDS neg Hypercoagulable lab unremarkable VTE prophylaxis -Lovenox No antithrombotic prior to admission, now on aspirin 81 mg daily and clopidogrel 75 mg daily DAPT for 3 weeks and then aspirin alone. Therapy recommendations: CIR --> OP PT/OT/SLP Disposition: home      ROS:   14 system review of systems performed and negative with exception of those listed in HPI  PMH:  Past Medical History:  Diagnosis Date   Polysubstance abuse (HCC)    quit in 2019 - used ETOH, crack cocaine    PSH:  Past Surgical History:  Procedure Laterality Date   genital surgery     LOOP RECORDER INSERTION N/A 05/01/2020   Procedure: LOOP RECORDER INSERTION;  Surgeon: Hillis Range, MD;  Location: [facility];  Service: Cardiovascular;  Laterality: N/A;    Social History:  Social History   Socioeconomic History   Marital status: Single    Spouse name: Not on file    Number of children: Not on file   Years of education: Not on file   Highest education level: Not on file  Occupational History   Not on file  Tobacco Use   Smoking status: Some Days    Packs/day: 0.50    Years: 45.00    Pack years: 22.50    Types: Cigarettes   Smokeless tobacco: Never  Substance and Sexual Activity   Alcohol use: Not Currently    Comment: quit 4 years ago (2018)   Drug use: Not Currently    Types: "Crack" cocaine    Comment: quit 2018-2019   Sexual activity: Not on file  Other Topics Concern   Not on file  Social History Narrative   Not on file   Social Determinants of Health   Financial Resource Strain: Not on file  Food Insecurity: Not on file  Transportation Needs: Not on file  Physical Activity: Not on file  Stress: Not on file  Social Connections: Not on file  Intimate Partner Violence: Not on file    Family History:  Family History  Problem Relation Age of Onset   Stroke Neg Hx     Medications:   Current Outpatient Medications on File Prior to Visit  Medication Sig Dispense Refill   aspirin 81 MG EC tablet Take 1 tablet (81 mg total) by mouth daily. Swallow whole. 150 tablet 0   atorvastatin (LIPITOR) 40  MG tablet Take 1 tablet (40 mg total) by mouth daily. 90 tablet 3   nicotine (NICODERM CQ - DOSED IN MG/24 HOURS) 14 mg/24hr patch Place 1 patch (14 mg total) onto the skin daily. 28 patch 0   No current facility-administered medications on file prior to visit.    Allergies:  No Known Allergies    OBJECTIVE:  Physical Exam  There were no vitals filed for this visit.   There is no height or weight on file to calculate BMI. No results found.  General: well developed, well nourished,  pleasant middle-aged African-American Baker, seated, in no evident distress Head: head normocephalic and atraumatic.   Neck: supple with no carotid or supraclavicular bruits Cardiovascular: regular rate and rhythm, no murmurs Musculoskeletal: no  deformity Skin:  no rash/petichiae Vascular:  Normal pulses all extremities   Neurologic Exam Mental Status: Awake and fully alert. Mild to moderate dysarthria. No evidence of aphasia.  Oriented to place and time. Recent and remote memory intact. Attention span, concentration and fund of knowledge appears appropriate during visit. Mood and affect appropriate. Recall: 2/3. 4 legged animal naming 19 in 60 seconds. Serial addition good.  Clock drawing 4/4 Cranial Nerves: Pupils equal, briskly reactive to light. Extraocular movements full without nystagmus. Visual fields full to confrontation. Hearing intact. Facial sensation intact.  Left lower facial weakness.  Tongue and palate moves normally and symmetrically.  Motor: Normal strength, bulk and tone on right side. LUE: 4/5 deltoid, EF and hand grip LLE: 4/5 hip flexor otherwise 5/5 Sensory.: intact to touch , pinprick , position and vibratory sensation.  Coordination: Rapid alternating movements normal in all extremities except decreased left hand. Finger-to-nose and heel-to-shin performed accurately on right side with mild incoordination left side.  Orbits right arm and left arm Gait and Station: Arises from chair without difficulty. Stance is normal. Gait demonstrates  slightly decreased stride length and step height LLE with mild imbalance and use of cane.  Tandem walk and heel toe not attempted.  Reflexes: 1+ and symmetric. Toes downgoing.        ASSESSMENT: Timothy Baker is a 60 y.o. year old Baker presented with 3-day history of left-sided weakness, left facial droop and dysarthria on 04/27/2020 with stroke work-up revealing patchy multifocal acute infarcts involving R CR, So and BG, embolic pattern secondary to unclear source. Vascular risk factors include HLD and tobacco use.     PLAN:  Cryptogenic stroke:  Residual deficit: Left hemiparesis, gait impairment, mild cognitive impairment and dysarthria. Will assist with completion of  short term disability which was initially started during hospitalization (per patient, PCP requests our office to complete) as he would likely have great difficulty returning as Estate agent with residual deficits.  Referral placed for neurocognitive evaluation as well as discussion of vocational rehab but he wishes to speak to his current employer to see if there are other job options available.  Discussed concern regarding him currently driving and OT recommendations -highly encourage driving evaluation through either driving rehab or DMV - he plans on contacting [employer] to possibly complete driving evaluation.  Continue to use cane for fall prevention.  Encouraged continued HEP which was advised at completion of therapy sessions Loop recorder has not shown atrial fibrillation thus far (personally reviewed monthly reports) -monitored by cardiology Plan on TEE once dental work completed - has appt with oral surgeon next month Continue aspirin 81 mg daily  and atorvastatin 40 mg daily for secondary stroke prevention Discussed secondary stroke prevention  measures and importance of close PCP follow up for aggressive stroke risk factor management  HLD: LDL goal <70.  Prior LDL 107 -repeat lipid panel today.  Continue atorvastatin 40 mg daily -request refills and ongoing monitoring and management of cholesterol per PCP Tobacco use: Reports complete tobacco cessation since discharge -highly encouraged continued abstinence    Follow up in 4 months or call earlier if needed   CC:  [facility] provider: Dr. Pearlean Brownie PCP: Delma Officer, [place]     Ihor Austin, [facility]  [facility] [address] [place], [place] [address]  Phone [phone] Fax [phone] Note: This document was prepared with digital dictation and possible smart phrase technology. Any transcriptional errors that result from this process are unintentional.

## 2020-12-17 ENCOUNTER — Ambulatory Visit (INDEPENDENT_AMBULATORY_CARE_PROVIDER_SITE_OTHER): Payer: BC Managed Care – PPO | Admitting: Adult Health

## 2020-12-17 ENCOUNTER — Telehealth: Payer: Self-pay | Admitting: Adult Health

## 2020-12-17 ENCOUNTER — Encounter: Payer: Self-pay | Admitting: Adult Health

## 2020-12-17 VITALS — BP 124/82 | HR 79 | Ht 75.0 in | Wt 189.5 lb

## 2020-12-17 DIAGNOSIS — I639 Cerebral infarction, unspecified: Secondary | ICD-10-CM | POA: Diagnosis not present

## 2020-12-17 DIAGNOSIS — E785 Hyperlipidemia, unspecified: Secondary | ICD-10-CM

## 2020-12-17 NOTE — Progress Notes (Signed)
[facility] [address] [place]. [place] [address] [phone]       STROKE FOLLOW UP NOTE  [name] Date of Birth:  [birth year] Medical Record Number:  [id number]   Reason for Referral: stroke follow up    SUBJECTIVE:   CHIEF COMPLAINT:  Chief Complaint  Patient presents with   Follow-up    Rm 2 here for 4 month f/u. Reports he has been doing well, would like to discuss the possibility of returning to work today. Works on a lumbar yard.       HPI:   Today, 12/16/2020, [name] returns for stroke follow-up regarding cryptogenic stroke on 04/27/2020.  Overall stable.  Reports great recovery since prior visit and he is requesting to return back to work.  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  He does have mild left hand weakness but does not interfere with daily activity.  He is no longer using a cane and denies any recent Baker.  Denies new stroke/TIA symptoms. Compliant on aspirin and atorvastatin without side effects.  Blood pressure today 124/82.  Continued tobacco cessation.  Loop recorder has not shown atrial fibrillation thus far.  Dental surgery completed 3 to 4 weeks ago.  He is ready to pursue completion of TEE.  No further concerns at this time.      History provided for reference purposes only Update 09/14/2020 JM [name] returns for 8-month stroke follow-up unaccompanied  Reports residual left sided weakness with left foot dragging, gait impairment, dysarthria, and cognitive difficulties.  He has not yet returned back to work and is requesting assistance with short-term disability - previously working as a Estate agent Completed therapies with OT reporting residual decreased left dominant hand grip  strength and decreased left-sided inattention and PT reporting residual left hemiparesis and gait impairment with imbalance He has since returned back to living on his own - he is able to prepare his own meals with microwave and air fryer - does not use stove - pays own bills and does own medications without difficulties - has returned back to driving without difficulty although per review OT notes, they recommended driving evaluation due to cognitive impairment and inattention deficits -this was not pursued due to financial reasons  Compliant on aspirin and atorvastatin without associated side effects Blood pressure today 124/77 Continues tobacco cessation  Loop recorder has not shown atrial fibrillation thus far  TEE not yet completed as currently waiting for dental surgery - has appointment with oral surgeon next month  No further concerns at this time  Initial visit 06/02/2020 JM: 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 Denies new or worsening stroke/TIA symptoms   Remains on aspirin and Plavix despite 3-week recommendation but denies side effects Compliant on atorvastatin 40 mg daily without myalgias Blood pressure 118/73 Reports complete  tobacco cessation since discharge -continues to use NicoDerm patches  Loop recorder has not shown atrial fibrillation thus far  TEE placed on hold until follow-up with dentistry for poor dentition -currently in the process of following up   Stroke  admission 04/27/2020  Timothy Baker is a  60 year old male with only known medical history of tobacco use and borderline HTN who presented on 04/27/2020 with left-sided weakness, left facial droop, and dysarthria for approximately 3 days.  Personally reviewed hospitalization pertinent progress notes, lab work and imaging with summary provided.  Evaluated by Dr. Roda Shutters with stroke work-up revealing patchy multifocal acute infarcts involving the right CR, So and BG, embolic pattern secondary to unclear source.  Recommend placement of loop recorder outpatient to assess for atrial fibrillation.  Plans for TEE outpatient.  Recommended DAPT for 3 weeks and aspirin alone.  LDL 107 -initiate atorvastatin 40 mg daily.  Current tobacco use with smoking cessation counseling provided.  No prior stroke history.  Evaluated by therapies who initially recommended CIR but eventually discharged home with recommended outpatient therapies as CIR unable to accept patient.  Stroke - patchy multifocal acute infarcts involving the right CR, SO and BG, embolic pattern, source unclear CT head Recent appearing and suspected acute infarct involving a portion ofthe anterior limb of the right internal capsule and immediately adjacent right centrum semiovale.   CTA head & neck: Normal CTA of the head and neck. MRI  Patchy multifocal acute ischemic nonhemorrhagic infarcts involving the periventricular and deep white matter of the rightcorona radiata and centrum semi ovale, with involvement of the underlying right basal ganglia.  2D Echo EF 60 to 65%. No shunt.  LE venous doppler - no DVT TEE will be done as outpt once pt tooth condition improves to avoid aspiration TCD bubble study no DVT Recommend loop recorder placement to rule out afib  LDL 107 HgbA1c 5.9 UDS neg Hypercoagulable lab unremarkable VTE prophylaxis -Lovenox No antithrombotic prior to admission, now on aspirin 81 mg daily and clopidogrel 75 mg daily DAPT for 3 weeks  and then aspirin alone. Therapy recommendations: CIR --> OP PT/OT/SLP Disposition: home      ROS:   14 system review of systems performed and negative with exception of those listed in HPI  PMH:  Past Medical History:  Diagnosis Date   Polysubstance abuse (HCC)    quit in 2019 - used ETOH, crack cocaine    PSH:  Past Surgical History:  Procedure Laterality Date   genital surgery     LOOP RECORDER INSERTION N/A 05/01/2020   Procedure: LOOP RECORDER INSERTION;  Surgeon: Hillis Range, MD;  Location: [facility];  Service: Cardiovascular;  Laterality: N/A;    Social History:  Social History   Socioeconomic History   Marital status: Single    Spouse name: Not on file   Number of children: Not on file   Years of education: Not on file   Highest education level: Not on file  Occupational History   Not on file  Tobacco Use   Smoking status: Some Days    Packs/day: 0.50    Years: 45.00    Pack years: 22.50    Types: Cigarettes   Smokeless tobacco: Never  Substance and Sexual Activity   Alcohol use: Not Currently    Comment: quit 4 years ago (2018)   Drug use: Not Currently    Types: "Crack" cocaine    Comment: quit 2018-2019   Sexual activity: Not on file  Other Topics Concern   Not on file  Social History Narrative   Not on file   Social Determinants of Health   Financial Resource Strain: Not on file  Food Insecurity: Not on file  Transportation Needs: Not on file  Physical Activity: Not on file  Stress: Not on file  Social Connections: Not on file  Intimate Partner Violence: Not on file    Family History:  Family History  Problem Relation Age of Onset   Stroke Neg Hx     Medications:   Current Outpatient Medications on File Prior to Visit  Medication Sig Dispense Refill   aspirin 81 MG EC tablet Take 1 tablet (81 mg total) by mouth daily. Swallow whole. 150 tablet 0   atorvastatin (LIPITOR) 40 MG tablet Take 1 tablet (40 mg total) by mouth  daily. 90 tablet 3   nicotine (NICODERM CQ - DOSED IN MG/24 HOURS) 14 mg/24hr patch Place 1 patch (14 mg total) onto the skin daily. 28 patch 0   No current facility-administered medications on file prior to visit.    Allergies:  No Known Allergies    OBJECTIVE:  Physical Exam  Vitals:   12/17/20 1434  BP: 124/82  Pulse: 79  SpO2: 98%  Weight: 189 lb 8 oz (86 kg)  Height: 6\' 3"  (1.905 m)    Body mass index is 23.69 kg/m. No results found.  General: well developed, well nourished,  pleasant middle-aged African-American male, seated, in no evident distress Head: head normocephalic and atraumatic.   Neck: supple with no carotid or supraclavicular bruits Cardiovascular: regular rate and rhythm, no murmurs Musculoskeletal: no deformity Skin:  no rash/petichiae Vascular:  Normal pulses all extremities   Neurologic Exam Mental Status: Awake and fully alert. Mild to moderate dysarthria (all teeth have been removed). No evidence of aphasia.  Oriented to place and time. Recent and remote memory intact. Attention span, concentration and fund of knowledge appears appropriate during visit. Mood and affect appropriate. Cranial Nerves: Pupils equal, briskly reactive to light. Extraocular movements full without nystagmus. Visual fields full to confrontation. Hearing intact. Facial sensation intact.  Face, tongue and palate moves normally and symmetrically.  Motor: Normal strength, bulk and tone on right side. LUE: 4+/5 grip strength and decreased hand dexterity LLE: 5/5 Sensory.: intact to touch , pinprick , position and vibratory sensation.  Coordination: Rapid alternating movements normal in all extremities except slightly decreased left hand. Finger-to-nose and heel-to-shin performed accurately bilaterally.  Gait and Station: Arises from chair without difficulty. Stance is normal. Gait demonstrates normal stride length and balance without use of assistive device.  Does not ambulate with  assistive device.  Heel toe without difficulty and mild difficulty performing tandem Reflexes: 1+ and symmetric. Toes downgoing.        ASSESSMENT: MANNY VITOLO is a 60 y.o. year old male presented with 3-day history of left-sided weakness, left facial droop and dysarthria on 04/27/2020 with stroke work-up revealing patchy multifocal acute infarcts involving R CR, So and BG, embolic pattern secondary to unclear source. Vascular risk factors include HLD and tobacco use.     PLAN:  Cryptogenic stroke:  Residual deficit: mild left hand decreased dexterity and mild gait impairment.  Released to go back to work on light duty basis with restrictions of avoiding overexertion, heavy lifting, climbing at heights or operating heavy machinery.  Loop recorder has not shown atrial fibrillation thus far (personally reviewed monthly reports) -monitored by cardiology Referral to EP to complete TEE as  dental work not completed - requested during hospitalization for further stroke work-up and evaluation of embolic source Continue aspirin 81 mg daily  and atorvastatin 40 mg daily for secondary stroke prevention Discussed secondary stroke prevention measures and importance of close PCP follow up for aggressive stroke risk factor management  HLD: LDL goal <70.  Prior LDL 53 on atorvastatin 40 mg daily.  Request ongoing monitoring per PCP Tobacco use: Reports complete tobacco cessation since discharge -highly encouraged continued abstinence    Follow up in 6 months or call earlier if needed   CC:  PCP: Clelland, Marlaine Hind, PA     I spent 38 minutes of face-to-face and non-face-to-face time with patient.  This included previsit chart review, lab review, study review, order entry, electronic health record documentation, patient education and discussion regarding prior stroke, residual deficits and return to work, loop recorder review and referral to cardiac electrophysiology, secondary stroke prevention  measures and aggressive stroke risk factor management and answered all other questions to patient's satisfaction   Ihor Austin, AGNP-BC  [facility] [address] [place], [place] [address]  Phone [phone] Fax [phone] Note: This document was prepared with digital dictation and possible smart phrase technology. Any transcriptional errors that result from this process are unintentional.

## 2020-12-17 NOTE — Telephone Encounter (Signed)
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 The job he described is more of a "light duty" role which would be okay for him to return to doing. He is not cleared yet to return to any type of heavy lifting/pushing/carrying (greater than 40 lbs), climbing at any height, overexertion or running of heavy equipment.

## 2020-12-17 NOTE — Telephone Encounter (Signed)
PHONE RM CAN RELAY  Contacted pt to see who letter needs to be sent to, pt unavailable and VM full, letter is at front for pick up.

## 2020-12-17 NOTE — Telephone Encounter (Signed)
Pt is needing a more specific Return to work note. Pt's employer Trena Platt from [employer] called stating that on the note pt was given needs to have a list of restrictions and if no restrictions then it needs to say No Restrictions. Terrilee Files or Chanetta Marshall [phone] can be reached out to. Please advise.

## 2020-12-17 NOTE — Telephone Encounter (Signed)
Please advise 

## 2020-12-17 NOTE — Patient Instructions (Signed)
You will be called to schedule a TEE by cardiology for completion of stroke work up  Continue aspirin 81 mg daily  and atorvastatin  for secondary stroke prevention  Continue to follow up with PCP regarding cholesterol and blood pressure management  Maintain strict control of hypertension with blood pressure goal below 130/90 and cholesterol with LDL cholesterol (bad cholesterol) goal below 70 mg/dL.       Followup in the future with me in 6 months or call earlier if needed       Thank you for coming to see [place] at [facility]. I hope we have been able to provide you high quality care today.  You may receive a patient satisfaction survey over the next few weeks. We would appreciate your feedback and comments so that we may continue to improve ourselves and the health of our patients.

## 2020-12-21 NOTE — Telephone Encounter (Signed)
Pt returned call and was informed. Pt verbalized appreciation.

## 2020-12-24 LAB — CUP PACEART REMOTE DEVICE CHECK
Date Time Interrogation Session: 20221004193021
Implantable Pulse Generator Implant Date: 20220211

## 2020-12-28 ENCOUNTER — Ambulatory Visit (INDEPENDENT_AMBULATORY_CARE_PROVIDER_SITE_OTHER): Payer: BC Managed Care – PPO

## 2020-12-28 DIAGNOSIS — I639 Cerebral infarction, unspecified: Secondary | ICD-10-CM | POA: Diagnosis not present

## 2020-12-31 ENCOUNTER — Encounter: Payer: Self-pay | Admitting: Internal Medicine

## 2021-01-06 NOTE — Progress Notes (Signed)
[facility] Summary Report / Loop Recorder 

## 2021-01-14 ENCOUNTER — Ambulatory Visit: Payer: BC Managed Care – PPO | Admitting: Adult Health

## 2021-01-24 LAB — CUP PACEART REMOTE DEVICE CHECK
Date Time Interrogation Session: 20221106192834
Implantable Pulse Generator Implant Date: 20220211

## 2021-02-01 ENCOUNTER — Ambulatory Visit (INDEPENDENT_AMBULATORY_CARE_PROVIDER_SITE_OTHER): Payer: BC Managed Care – PPO

## 2021-02-01 DIAGNOSIS — I639 Cerebral infarction, unspecified: Secondary | ICD-10-CM

## 2021-02-05 ENCOUNTER — Encounter: Payer: Self-pay | Admitting: *Deleted

## 2021-02-05 ENCOUNTER — Ambulatory Visit (INDEPENDENT_AMBULATORY_CARE_PROVIDER_SITE_OTHER): Payer: BC Managed Care – PPO | Admitting: Internal Medicine

## 2021-02-05 ENCOUNTER — Other Ambulatory Visit: Payer: Self-pay

## 2021-02-05 ENCOUNTER — Encounter: Payer: Self-pay | Admitting: Internal Medicine

## 2021-02-05 VITALS — BP 134/86 | HR 66 | Ht 75.0 in | Wt 199.6 lb

## 2021-02-05 DIAGNOSIS — I639 Cerebral infarction, unspecified: Secondary | ICD-10-CM | POA: Diagnosis not present

## 2021-02-05 LAB — CUP PACEART INCLINIC DEVICE CHECK
Date Time Interrogation Session: 20221118161112
Implantable Pulse Generator Implant Date: 20220211

## 2021-02-05 NOTE — Progress Notes (Signed)
   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  Since his stroke, the patient reports doing very well. He has made good recovery.  He does have some residual left sided weakness.  Today, he denies symptoms of palpitations, chest pain, shortness of breath,  lower extremity edema, dizziness, presyncope, or syncope.  The patient is otherwise without complaint today.   Past Medical History:  Diagnosis Date   Polysubstance abuse (HCC)    quit in 2019 - used ETOH, crack cocaine   Past Surgical History:  Procedure Laterality Date   genital surgery     LOOP RECORDER INSERTION N/A 05/01/2020   Procedure: LOOP RECORDER INSERTION;  Surgeon: Hillis Range, MD;  Location: [facility] INV[facility];  Service: Cardiovascular;  Laterality: N/A;    ROS- all systems are reviewed and negatives except as per HPI above  Current Outpatient Medications  Medication Sig Dispense Refill   aspirin 81 MG EC tablet Take 1 tablet (81 mg total) by mouth daily. Swallow whole. 150 tablet 0   atorvastatin (LIPITOR) 40 MG tablet Take 1 tablet (40 mg total) by mouth daily. 90 tablet 3   nicotine (NICODERM CQ - DOSED IN MG/24 HOURS) 14 mg/24hr patch Place 1 patch (14 mg total) onto the skin daily. 28 patch 0   No current facility-administered medications for this visit.    Physical Exam: Vitals:   02/05/21 1559  BP: 134/86  Pulse: 66  SpO2: 98%  Weight: 199 lb 9.6 oz (90.5 kg)  Height: 6\' 3"  (1.905 m)    GEN- The patient is well appearing, alert and oriented x 3 today.   Head- normocephalic, atraumatic Eyes-  Sclera clear, conjunctiva pink Ears- hearing intact Oropharynx- clear with dental extraction noted Lungs- Clear to ausculation bilaterally, normal work of breathing Heart- Regular rate and rhythm, no murmurs, rubs or gallops, PMI not laterally displaced GI- soft, NT, ND, + BS Extremities- no clubbing,  cyanosis, or edema  Wt Readings from Last 3 Encounters:  02/05/21 199 lb 9.6 oz (90.5 kg)  12/17/20 189 lb 8 oz (86 kg)  09/14/20 190 lb (86.2 kg)    EKG tracing ordered today is personally reviewed and shows sinus  Assessment and Plan:  Stroke ILR reviewed today.  He has not had afib observed TEE was advised in February however he did not have this done due to poor dentition.  I agree with March NP (her 12/17/20 note reviewed) that TEE is prudent to further evaluate for potential causes of his prior stroke which remains unresolved.  Risks and benefits to TEE discussed with the patient who wishes to proceed.   2. HL Continue atorvastatin 40mg  daily  3. Tobacco Cessation advised  We will schedule TEE Return as needed We will follow closely with remote monitoring  12/19/20 MD, [facility] 02/05/2021 4:13 PM

## 2021-02-05 NOTE — Patient Instructions (Addendum)
Medication Instructions:  Your physician recommends that you continue on your current medications as directed. Please refer to the Current Medication list given to you today. *If you need a refill on your cardiac medications before your next appointment, please call your pharmacy*  Lab Work: None. If you have labs (blood work) drawn today and your tests are completely normal, you will receive your results only by: MyChart Message (if you have MyChart) OR A paper copy in the mail If you have any lab test that is abnormal or we need to change your treatment, we will call you to review the results.  Testing/Procedures: Your physician has requested that you have a TEE. During a TEE, sound waves are used to create images of your heart. It provides your doctor with information about the size and shape of your heart and how well your heart's chambers and valves are working. In this test, a transducer is attached to the end of a flexible tube that's guided down your throat and into your esophagus (the tube leading from you mouth to your stomach) to get a more detailed image of your heart. You are not awake for the procedure. Please see the instruction sheet given to you today. For further information please visit [email or web address]    Follow-Up: At [facility], you and your health needs are our priority.  As part of our continuing mission to provide you with exceptional heart care, we have created designated Provider Care Teams.  These Care Teams include your primary Cardiologist (physician) and Advanced Practice Providers (APPs -  Physician Assistants and Nurse Practitioners) who all work together to provide you with the care you need, when you need it.  Your physician wants you to follow-up in: As needed with  Hillis Range, MD .  We recommend signing up for the patient portal called "MyChart".  Sign up information is provided on this After Visit Summary.  MyChart is used to connect with patients  for Virtual Visits (Telemedicine).  Patients are able to view lab/test results, encounter notes, upcoming appointments, etc.  Non-urgent messages can be sent to your provider as well.   To learn more about what you can do with MyChart, go to [email or web address].    Any Other Special Instructions Will Be Listed Below (If Applicable).

## 2021-02-05 NOTE — H&P (View-Only) (Signed)
   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  Since his stroke, the patient reports doing very well. He has made good recovery.  He does have some residual left sided weakness.  Today, he denies symptoms of palpitations, chest pain, shortness of breath,  lower extremity edema, dizziness, presyncope, or syncope.  The patient is otherwise without complaint today.   Past Medical History:  Diagnosis Date   Polysubstance abuse (HCC)    quit in 2019 - used ETOH, crack cocaine   Past Surgical History:  Procedure Laterality Date   genital surgery     LOOP RECORDER INSERTION N/A 05/01/2020   Procedure: LOOP RECORDER INSERTION;  Surgeon: Hillis Range, MD;  Location: [facility];  Service: Cardiovascular;  Laterality: N/A;    ROS- all systems are reviewed and negatives except as per HPI above  Current Outpatient Medications  Medication Sig Dispense Refill   aspirin 81 MG EC tablet Take 1 tablet (81 mg total) by mouth daily. Swallow whole. 150 tablet 0   atorvastatin (LIPITOR) 40 MG tablet Take 1 tablet (40 mg total) by mouth daily. 90 tablet 3   nicotine (NICODERM CQ - DOSED IN MG/24 HOURS) 14 mg/24hr patch Place 1 patch (14 mg total) onto the skin daily. 28 patch 0   No current facility-administered medications for this visit.    Physical Exam: Vitals:   02/05/21 1559  BP: 134/86  Pulse: 66  SpO2: 98%  Weight: 199 lb 9.6 oz (90.5 kg)  Height: 6\' 3"  (1.905 m)    GEN- The patient is well appearing, alert and oriented x 3 today.   Head- normocephalic, atraumatic Eyes-  Sclera clear, conjunctiva pink Ears- hearing intact Oropharynx- clear with dental extraction noted Lungs- Clear to ausculation bilaterally, normal work of breathing Heart- Regular rate and rhythm, no murmurs, rubs or gallops, PMI not laterally displaced GI- soft, NT, ND, + BS Extremities- no clubbing,  cyanosis, or edema  Wt Readings from Last 3 Encounters:  02/05/21 199 lb 9.6 oz (90.5 kg)  12/17/20 189 lb 8 oz (86 kg)  09/14/20 190 lb (86.2 kg)    EKG tracing ordered today is personally reviewed and shows sinus  Assessment and Plan:  Stroke ILR reviewed today.  He has not had afib observed TEE was advised in February however he did not have this done due to poor dentition.  I agree with March NP (her 12/17/20 note reviewed) that TEE is prudent to further evaluate for potential causes of his prior stroke which remains unresolved.  Risks and benefits to TEE discussed with the patient who wishes to proceed.   2. HL Continue atorvastatin 40mg  daily  3. Tobacco Cessation advised  We will schedule TEE Return as needed We will follow closely with remote monitoring  12/19/20 MD, [facility] 02/05/2021 4:13 PM

## 2021-02-09 NOTE — Progress Notes (Signed)
[facility] Summary Report / Loop Recorder 

## 2021-02-12 ENCOUNTER — Encounter (HOSPITAL_COMMUNITY)
Admission: RE | Disposition: A | Discharge status: Home / Self Care | Payer: Self-pay | Source: Home / Self Care | Attending: Cardiology

## 2021-02-12 ENCOUNTER — Ambulatory Visit (HOSPITAL_COMMUNITY): Payer: BC Managed Care – PPO | Admitting: Anesthesiology

## 2021-02-12 ENCOUNTER — Encounter (HOSPITAL_COMMUNITY): Payer: Self-pay | Admitting: Cardiology

## 2021-02-12 ENCOUNTER — Ambulatory Visit (HOSPITAL_COMMUNITY)
Admission: RE | Admit: 2021-02-12 | Discharge: 2021-02-12 | Disposition: A | Discharge status: Home / Self Care | Payer: BC Managed Care – PPO | Attending: Cardiology | Admitting: Cardiology

## 2021-02-12 ENCOUNTER — Ambulatory Visit (HOSPITAL_BASED_OUTPATIENT_CLINIC_OR_DEPARTMENT_OTHER): Payer: BC Managed Care – PPO

## 2021-02-12 ENCOUNTER — Other Ambulatory Visit: Payer: Self-pay

## 2021-02-12 DIAGNOSIS — Z720 Tobacco use: Secondary | ICD-10-CM

## 2021-02-12 DIAGNOSIS — I639 Cerebral infarction, unspecified: Secondary | ICD-10-CM

## 2021-02-12 DIAGNOSIS — E785 Hyperlipidemia, unspecified: Secondary | ICD-10-CM | POA: Diagnosis not present

## 2021-02-12 HISTORY — PX: TEE WITHOUT CARDIOVERSION: SHX5443

## 2021-02-12 HISTORY — PX: BUBBLE STUDY: SHX6837

## 2021-02-12 LAB — POCT I-STAT, CHEM 8
BUN: 8 mg/dL (ref 8–23)
Calcium, Ion: 1.21 mmol/L (ref 1.15–1.40)
Chloride: 107 mmol/L (ref 98–111)
Creatinine, Ser: 0.9 mg/dL (ref 0.61–1.24)
Glucose, Bld: 103 mg/dL — ABNORMAL HIGH (ref 70–99)
HCT: 41 % (ref 39.0–52.0)
Hemoglobin: 13.9 g/dL (ref 13.0–17.0)
Potassium: 4 mmol/L (ref 3.5–5.1)
Sodium: 141 mmol/L (ref 135–145)
TCO2: 23 mmol/L (ref 22–32)

## 2021-02-12 SURGERY — ECHOCARDIOGRAM, TRANSESOPHAGEAL
Anesthesia: Monitor Anesthesia Care

## 2021-02-12 MED ORDER — LIDOCAINE 2% (20 MG/ML) 5 ML SYRINGE
INTRAMUSCULAR | Status: DC | PRN
  Administered 2021-02-12: 60 mg via INTRAVENOUS
  Administered 2021-02-12: 40 mg via INTRAVENOUS

## 2021-02-12 MED ORDER — PROPOFOL 10 MG/ML IV BOLUS
INTRAVENOUS | Status: DC | PRN
  Administered 2021-02-12: 40 mg via INTRAVENOUS
  Administered 2021-02-12: 30 mg via INTRAVENOUS

## 2021-02-12 MED ORDER — PROPOFOL 500 MG/50ML IV EMUL
INTRAVENOUS | Status: DC | PRN
  Administered 2021-02-12: 50 ug/kg/min via INTRAVENOUS

## 2021-02-12 MED ORDER — SODIUM CHLORIDE 0.9 % IV SOLN: INTRAVENOUS | Status: DC

## 2021-02-12 NOTE — Anesthesia Postprocedure Evaluation (Signed)
Anesthesia Post Note  Patient: Timothy Baker  Procedure(s) Performed: TRANSESOPHAGEAL ECHOCARDIOGRAM (TEE) BUBBLE STUDY     Patient location during evaluation: PACU Anesthesia Type: MAC Level of consciousness: awake and alert Pain management: pain level controlled Vital Signs Assessment: post-procedure vital signs reviewed and stable Respiratory status: spontaneous breathing Cardiovascular status: stable Anesthetic complications: no   No notable events documented.  Last Vitals:  Vitals:   02/12/21 1004 02/12/21 1007  BP: (!) 150/86 (!) 159/79  Pulse: 66 (!) 58  Resp: 14 20  Temp:    SpO2: 100% 99%    Last Pain:  Vitals:   02/12/21 1007  TempSrc:   PainSc: 0-No pain                 Nolon Nations

## 2021-02-12 NOTE — Progress Notes (Signed)
  Echocardiogram 2D Echocardiogram has been performed.  Roosvelt Maser F 02/12/2021, 9:38 AM

## 2021-02-12 NOTE — Discharge Instructions (Signed)

## 2021-02-12 NOTE — CV Procedure (Signed)
    PROCEDURE NOTE:  Procedure:  Transesophageal echocardiogram Operator:  Armanda Magic, MD Indications:  CVA Complications: None  During this procedure the patient is administered a total of Propofol 150mg  to achieve and maintain moderate conscious sedation.  The patient's heart rate, blood pressure, and oxygen saturation are monitored continuously during the procedure by anesthesia.   Results: Normal LV size and function Normal RV size and function Mildly dilated RA with chiari network. Normal LA and LA appendage with no thrombus Normal TV with trivial TR Normal PV with trivial PR Normal MV with mild MR Normal trileaflet AV with trivial AR Normal interatrial septum with no evidence of shunt by colorflow dopper or agitated saline contrast injection Minimal atherosclerosis of the thoracic and ascending aorta.  The patient tolerated the procedure well and was transferred back to their room in stable condition.  Signed: , MD [facility]

## 2021-02-12 NOTE — Interval H&P Note (Signed)
History and Physical Interval Note:  02/12/2021 8:50 AM  Timothy Baker  has presented today for surgery, with the diagnosis of CVA.  The various methods of treatment have been discussed with the patient and family. After consideration of risks, benefits and other options for treatment, the patient has consented to  Procedure(s): TRANSESOPHAGEAL ECHOCARDIOGRAM (TEE) (N/A) as a surgical intervention.  The patient's history has been reviewed, patient examined, no change in status, stable for surgery.  I have reviewed the patient's chart and labs.  Questions were answered to the patient's satisfaction.     Armanda Magic

## 2021-02-12 NOTE — Transfer of Care (Signed)
Immediate Anesthesia Transfer of Care Note  Patient: [name]  Procedure(s) Performed: TRANSESOPHAGEAL ECHOCARDIOGRAM (TEE) BUBBLE STUDY  Patient Location: Endoscopy Unit  Anesthesia Type:MAC  Level of Consciousness: awake and alert   Airway & Oxygen Therapy: Patient Spontanous Breathing and Patient connected to nasal cannula oxygen  Post-op Assessment: Report given to RN and Post -op Vital signs reviewed and stable  Post vital signs: Reviewed and stable  Last Vitals:  Vitals Value Taken Time  BP 136/97 02/12/21 0937  Temp    Pulse 85 02/12/21 0937  Resp 22 02/12/21 0937  SpO2 99 % 02/12/21 0937    Last Pain:  Vitals:   02/12/21 0937  TempSrc:   PainSc: 0-No pain         Complications: No notable events documented.

## 2021-02-12 NOTE — Anesthesia Procedure Notes (Signed)
Procedure Name: MAC Date/Time: 02/12/2021 9:12 AM Performed by: Lorie Phenix, CRNA Pre-anesthesia Checklist: Patient identified, Emergency Drugs available, Suction available and Patient being monitored Patient Re-evaluated:Patient Re-evaluated prior to induction Oxygen Delivery Method: Nasal cannula Induction Type: IV induction Placement Confirmation: positive ETCO2

## 2021-02-12 NOTE — Anesthesia Preprocedure Evaluation (Signed)
Anesthesia Evaluation  Patient identified by MRN, date of birth, ID band  Reviewed: Allergy & Precautions, NPO status , Patient's Chart, lab work & pertinent test results  Airway Mallampati: II  TM Distance: >3 FB Neck ROM: Full    Dental  (+) Edentulous Upper, Edentulous Lower, Dental Advisory Given   Pulmonary neg pulmonary ROS, Current Smoker,    Pulmonary exam normal breath sounds clear to auscultation       Cardiovascular negative cardio ROS Normal cardiovascular exam Rhythm:Regular Rate:Normal  Echo 04/2020 1. Left ventricular ejection fraction, by estimation, is 60 to 65%. The left ventricle has normal function. The left ventricle has no regional wall motion abnormalities. Left ventricular diastolic parameters are indeterminate.  2. Right ventricular systolic function is normal. The right ventricular size is normal. Tricuspid regurgitation signal is inadequate for assessing PA pressure.  3. The mitral valve is normal in structure. No evidence of mitral valve regurgitation. No evidence of mitral stenosis.  4. The aortic valve is tricuspid. Aortic valve regurgitation is trivial. No aortic stenosis is present.  5. The inferior vena cava is normal in size with greater than 50% respiratory variability, suggesting right atrial pressure of 3 mmHg.    Neuro/Psych CVA    GI/Hepatic negative GI ROS, Neg liver ROS,   Endo/Other  negative endocrine ROS  Renal/GU negative Renal ROS     Musculoskeletal negative musculoskeletal ROS (+)   Abdominal   Peds  Hematology negative hematology ROS (+)   Anesthesia Other Findings   Reproductive/Obstetrics                             Anesthesia Physical Anesthesia Plan  ASA: 3  Anesthesia Plan: MAC   Post-op Pain Management: Minimal or no pain anticipated   Induction: Intravenous  PONV Risk Score and Plan: 0 and Propofol infusion and Treatment may  vary due to age or medical condition  Airway Management Planned: Natural Airway  Additional Equipment:   Intra-op Plan:   Post-operative Plan:   Informed Consent: I have reviewed the patients History and Physical, chart, labs and discussed the procedure including the risks, benefits and alternatives for the proposed anesthesia with the patient or authorized representative who has indicated his/her understanding and acceptance.     Dental advisory given  Plan Discussed with: CRNA  Anesthesia Plan Comments:         Anesthesia Quick Evaluation

## 2021-02-14 ENCOUNTER — Encounter (HOSPITAL_COMMUNITY): Payer: Self-pay | Admitting: Cardiology

## 2021-03-08 ENCOUNTER — Ambulatory Visit (INDEPENDENT_AMBULATORY_CARE_PROVIDER_SITE_OTHER): Payer: BC Managed Care – PPO

## 2021-03-08 DIAGNOSIS — I639 Cerebral infarction, unspecified: Secondary | ICD-10-CM | POA: Diagnosis not present

## 2021-03-08 LAB — CUP PACEART REMOTE DEVICE CHECK
Date Time Interrogation Session: 20221217230733
Implantable Pulse Generator Implant Date: 20220211

## 2021-03-18 NOTE — Progress Notes (Signed)
[facility] Summary Report / Loop Recorder 

## 2021-04-12 ENCOUNTER — Ambulatory Visit (INDEPENDENT_AMBULATORY_CARE_PROVIDER_SITE_OTHER): Payer: BC Managed Care – PPO

## 2021-04-12 DIAGNOSIS — I639 Cerebral infarction, unspecified: Secondary | ICD-10-CM | POA: Diagnosis not present

## 2021-04-12 LAB — CUP PACEART REMOTE DEVICE CHECK
Date Time Interrogation Session: 20230122230331
Implantable Pulse Generator Implant Date: 20220211

## 2021-04-23 NOTE — Progress Notes (Signed)
[facility] Summary Report / Loop Recorder 

## 2021-05-16 LAB — CUP PACEART REMOTE DEVICE CHECK
Date Time Interrogation Session: 20230224230609
Implantable Pulse Generator Implant Date: 20220211

## 2021-05-17 ENCOUNTER — Ambulatory Visit (INDEPENDENT_AMBULATORY_CARE_PROVIDER_SITE_OTHER): Payer: BC Managed Care – PPO

## 2021-05-17 DIAGNOSIS — I639 Cerebral infarction, unspecified: Secondary | ICD-10-CM

## 2021-05-18 ENCOUNTER — Telehealth: Payer: Self-pay

## 2021-05-18 NOTE — Telephone Encounter (Signed)
Patient presented to device clinic unscheduled over concern that his loop recorder had "moved" from its original position. Patient brought to device room and assessed. Provided reassurance that loop recorder remains in correct position. Patient is also informed that loops my migrate slightly lower than insertion scar however would predominantly stay in same area. Confirmed remote transmission are up to day and device is functioning normally. Patient appreciative of assessment and information.

## 2021-05-24 NOTE — Progress Notes (Signed)
[facility] Summary Report / Loop Recorder 

## 2021-06-17 ENCOUNTER — Ambulatory Visit: Payer: BC Managed Care – PPO | Admitting: Adult Health

## 2021-06-17 ENCOUNTER — Encounter: Payer: Self-pay | Admitting: Adult Health

## 2021-06-21 ENCOUNTER — Ambulatory Visit (INDEPENDENT_AMBULATORY_CARE_PROVIDER_SITE_OTHER): Payer: BC Managed Care – PPO

## 2021-06-21 DIAGNOSIS — I639 Cerebral infarction, unspecified: Secondary | ICD-10-CM

## 2021-06-22 LAB — CUP PACEART REMOTE DEVICE CHECK
Date Time Interrogation Session: 20230331230756
Implantable Pulse Generator Implant Date: 20220211

## 2021-07-06 NOTE — Progress Notes (Signed)
[facility] Summary Report / Loop Recorder 

## 2021-07-22 LAB — CUP PACEART REMOTE DEVICE CHECK
Date Time Interrogation Session: 20230503231516
Implantable Pulse Generator Implant Date: 20220211

## 2021-07-26 ENCOUNTER — Ambulatory Visit (INDEPENDENT_AMBULATORY_CARE_PROVIDER_SITE_OTHER): Payer: BC Managed Care – PPO

## 2021-07-26 DIAGNOSIS — I639 Cerebral infarction, unspecified: Secondary | ICD-10-CM

## 2021-08-11 ENCOUNTER — Telehealth: Payer: Self-pay | Admitting: *Deleted

## 2021-08-11 NOTE — Telephone Encounter (Signed)
Pt USable life form request needs to go to Cardiology to be fill out.

## 2021-08-13 NOTE — Progress Notes (Signed)
[facility] Summary Report / Loop Recorder 

## 2021-08-30 ENCOUNTER — Ambulatory Visit (INDEPENDENT_AMBULATORY_CARE_PROVIDER_SITE_OTHER): Payer: BC Managed Care – PPO

## 2021-08-30 DIAGNOSIS — I639 Cerebral infarction, unspecified: Secondary | ICD-10-CM

## 2021-08-31 LAB — CUP PACEART REMOTE DEVICE CHECK
Date Time Interrogation Session: 20230605231716
Implantable Pulse Generator Implant Date: 20220211

## 2021-09-13 ENCOUNTER — Ambulatory Visit: Payer: Medicaid Other | Admitting: Adult Health

## 2021-09-13 ENCOUNTER — Encounter: Payer: Self-pay | Admitting: Adult Health

## 2021-09-13 VITALS — BP 144/83 | HR 73 | Ht 75.0 in | Wt 192.0 lb

## 2021-09-13 DIAGNOSIS — I639 Cerebral infarction, unspecified: Secondary | ICD-10-CM | POA: Diagnosis not present

## 2021-09-15 NOTE — Progress Notes (Signed)
[facility] Summary Report / Loop Recorder 

## 2021-09-30 LAB — CUP PACEART REMOTE DEVICE CHECK
Date Time Interrogation Session: 20230708230609
Implantable Pulse Generator Implant Date: 20220211

## 2021-10-04 ENCOUNTER — Ambulatory Visit (INDEPENDENT_AMBULATORY_CARE_PROVIDER_SITE_OTHER): Payer: BC Managed Care – PPO

## 2021-10-04 DIAGNOSIS — I639 Cerebral infarction, unspecified: Secondary | ICD-10-CM

## 2021-11-04 NOTE — Progress Notes (Signed)
[facility] Summary Report / Loop Recorder 

## 2021-11-08 ENCOUNTER — Ambulatory Visit (INDEPENDENT_AMBULATORY_CARE_PROVIDER_SITE_OTHER): Payer: Medicaid Other

## 2021-11-08 DIAGNOSIS — I639 Cerebral infarction, unspecified: Secondary | ICD-10-CM

## 2021-11-09 LAB — CUP PACEART REMOTE DEVICE CHECK
Date Time Interrogation Session: 20230820230921
Implantable Pulse Generator Implant Date: 20220211

## 2021-12-06 NOTE — Progress Notes (Signed)
[facility] Summary Report / Loop Recorder 

## 2021-12-13 ENCOUNTER — Ambulatory Visit (INDEPENDENT_AMBULATORY_CARE_PROVIDER_SITE_OTHER): Payer: Medicaid Other

## 2021-12-13 DIAGNOSIS — I639 Cerebral infarction, unspecified: Secondary | ICD-10-CM | POA: Diagnosis not present

## 2021-12-14 LAB — CUP PACEART REMOTE DEVICE CHECK
Date Time Interrogation Session: 20230922230402
Implantable Pulse Generator Implant Date: 20220211

## 2021-12-28 NOTE — Progress Notes (Signed)
[facility] Summary Report / Loop Recorder 

## 2022-01-17 ENCOUNTER — Ambulatory Visit (INDEPENDENT_AMBULATORY_CARE_PROVIDER_SITE_OTHER): Payer: No Typology Code available for payment source

## 2022-01-17 DIAGNOSIS — I639 Cerebral infarction, unspecified: Secondary | ICD-10-CM | POA: Diagnosis not present

## 2022-01-18 LAB — CUP PACEART REMOTE DEVICE CHECK
Date Time Interrogation Session: 20231025231133
Implantable Pulse Generator Implant Date: 20220211

## 2022-02-19 NOTE — Progress Notes (Signed)
[facility] Summary Report / Loop Recorder 

## 2022-02-21 ENCOUNTER — Ambulatory Visit (INDEPENDENT_AMBULATORY_CARE_PROVIDER_SITE_OTHER): Payer: BC Managed Care – PPO

## 2022-02-21 DIAGNOSIS — I639 Cerebral infarction, unspecified: Secondary | ICD-10-CM

## 2022-02-21 LAB — CUP PACEART REMOTE DEVICE CHECK
Date Time Interrogation Session: 20231203230931
Implantable Pulse Generator Implant Date: 20220211

## 2022-03-28 ENCOUNTER — Ambulatory Visit (INDEPENDENT_AMBULATORY_CARE_PROVIDER_SITE_OTHER): Payer: BC Managed Care – PPO

## 2022-03-28 DIAGNOSIS — I639 Cerebral infarction, unspecified: Secondary | ICD-10-CM

## 2022-03-29 LAB — CUP PACEART REMOTE DEVICE CHECK
Date Time Interrogation Session: 20240107231717
Implantable Pulse Generator Implant Date: 20220211

## 2022-03-31 NOTE — Progress Notes (Signed)
[facility] Summary Report / Loop Recorder 

## 2022-05-01 LAB — CUP PACEART REMOTE DEVICE CHECK
Date Time Interrogation Session: 20240209231127
Implantable Pulse Generator Implant Date: 20220211

## 2022-05-02 ENCOUNTER — Ambulatory Visit: Payer: Medicaid Other

## 2022-05-02 DIAGNOSIS — I639 Cerebral infarction, unspecified: Secondary | ICD-10-CM | POA: Diagnosis not present

## 2022-05-03 NOTE — Progress Notes (Signed)
[facility] Summary Report / Loop Recorder 

## 2022-06-06 ENCOUNTER — Ambulatory Visit (INDEPENDENT_AMBULATORY_CARE_PROVIDER_SITE_OTHER): Payer: No Typology Code available for payment source

## 2022-06-06 DIAGNOSIS — I639 Cerebral infarction, unspecified: Secondary | ICD-10-CM | POA: Diagnosis not present

## 2022-06-07 LAB — CUP PACEART REMOTE DEVICE CHECK
Date Time Interrogation Session: 20240317231351
Implantable Pulse Generator Implant Date: 20220211

## 2022-06-15 NOTE — Progress Notes (Signed)
[facility] Summary Report / Loop Recorder 

## 2022-07-11 ENCOUNTER — Ambulatory Visit (INDEPENDENT_AMBULATORY_CARE_PROVIDER_SITE_OTHER): Payer: No Typology Code available for payment source

## 2022-07-11 DIAGNOSIS — I639 Cerebral infarction, unspecified: Secondary | ICD-10-CM | POA: Diagnosis not present

## 2022-07-12 LAB — CUP PACEART REMOTE DEVICE CHECK
Date Time Interrogation Session: 20240419230401
Implantable Pulse Generator Implant Date: 20220211

## 2022-07-18 NOTE — Progress Notes (Signed)
[facility] Summary Report / Loop Recorder 

## 2022-08-11 LAB — CUP PACEART REMOTE DEVICE CHECK
Date Time Interrogation Session: 20240522230809
Implantable Pulse Generator Implant Date: 20220211

## 2022-08-16 ENCOUNTER — Ambulatory Visit (INDEPENDENT_AMBULATORY_CARE_PROVIDER_SITE_OTHER): Payer: No Typology Code available for payment source

## 2022-08-16 DIAGNOSIS — I639 Cerebral infarction, unspecified: Secondary | ICD-10-CM

## 2022-08-17 NOTE — Progress Notes (Signed)
[facility] Summary Report / Loop Recorder 

## 2022-09-05 ENCOUNTER — Other Ambulatory Visit: Payer: Self-pay | Admitting: Physician Assistant

## 2022-09-05 DIAGNOSIS — F17200 Nicotine dependence, unspecified, uncomplicated: Secondary | ICD-10-CM

## 2022-09-12 NOTE — Progress Notes (Signed)
[facility] Summary Report / Loop Recorder 

## 2022-09-13 LAB — CUP PACEART REMOTE DEVICE CHECK
Date Time Interrogation Session: 20240624231003
Implantable Pulse Generator Implant Date: 20220211

## 2022-09-19 ENCOUNTER — Ambulatory Visit (INDEPENDENT_AMBULATORY_CARE_PROVIDER_SITE_OTHER): Payer: No Typology Code available for payment source

## 2022-09-19 DIAGNOSIS — I639 Cerebral infarction, unspecified: Secondary | ICD-10-CM | POA: Diagnosis not present

## 2022-10-06 NOTE — Progress Notes (Signed)
[facility] Summary Report / Loop Recorder 

## 2022-10-24 ENCOUNTER — Ambulatory Visit (INDEPENDENT_AMBULATORY_CARE_PROVIDER_SITE_OTHER): Payer: No Typology Code available for payment source

## 2022-10-24 DIAGNOSIS — I639 Cerebral infarction, unspecified: Secondary | ICD-10-CM | POA: Diagnosis not present

## 2022-11-08 NOTE — Progress Notes (Signed)
[facility] Summary Report / Loop Recorder 

## 2022-11-28 ENCOUNTER — Ambulatory Visit (INDEPENDENT_AMBULATORY_CARE_PROVIDER_SITE_OTHER): Payer: No Typology Code available for payment source

## 2022-11-28 DIAGNOSIS — I639 Cerebral infarction, unspecified: Secondary | ICD-10-CM | POA: Diagnosis not present

## 2022-11-28 LAB — CUP PACEART REMOTE DEVICE CHECK
Date Time Interrogation Session: 20240906230527
Implantable Pulse Generator Implant Date: 20220211

## 2022-12-06 IMAGING — CT CT ANGIO HEAD
2 of 7 series · 8 of 33 positions shown · IV contrast (omnipaque)
Comparison: None.

CLINICAL DATA: Right hemisphere small vessel infarcts.

EXAM:
CT ANGIOGRAPHY HEAD AND NECK
TECHNIQUE: Multidetector CT imaging of the head and neck was performed using
the standard protocol during bolus administration of intravenous
contrast. Multiplanar CT image reconstructions and MIPs were
obtained to evaluate the vascular anatomy. Carotid stenosis
measurements (when applicable) are obtained utilizing NASCET
criteria, using the distal internal carotid diameter as the
denominator.
CONTRAST:  75mL OMNIPAQUE IOHEXOL 350 MG/ML SOLN

[Series 5: cta neck/head · axial · 0.65mm/px · z∈[-266,-152]mm · 2 of 171 slices shown]
[im 57/171  soft-tissue]
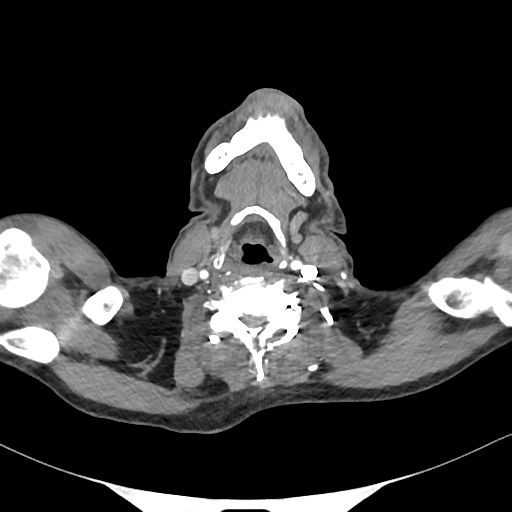
[im 114/171  soft-tissue]
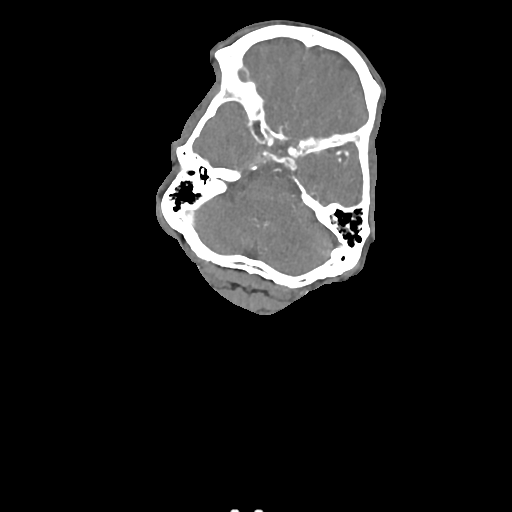

[Series 7: ax thins · axial · 0.39mm/px · z∈[-330,-90]mm · 6 of 337 slices shown]
[im 49/337  soft-tissue]
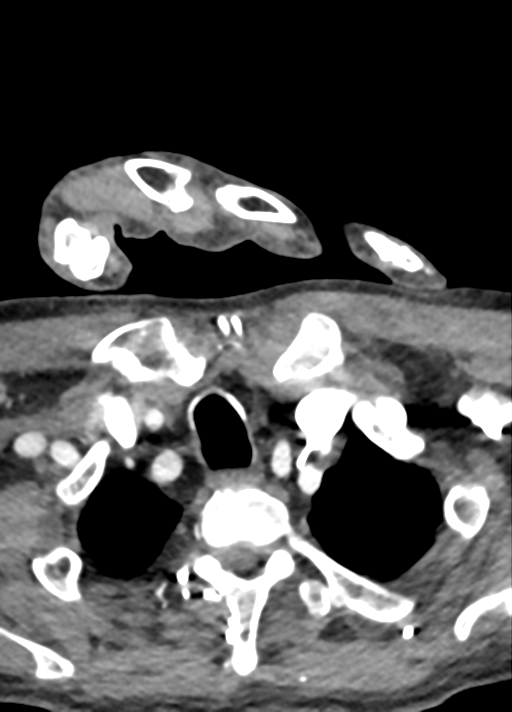
[im 97/337  bone]
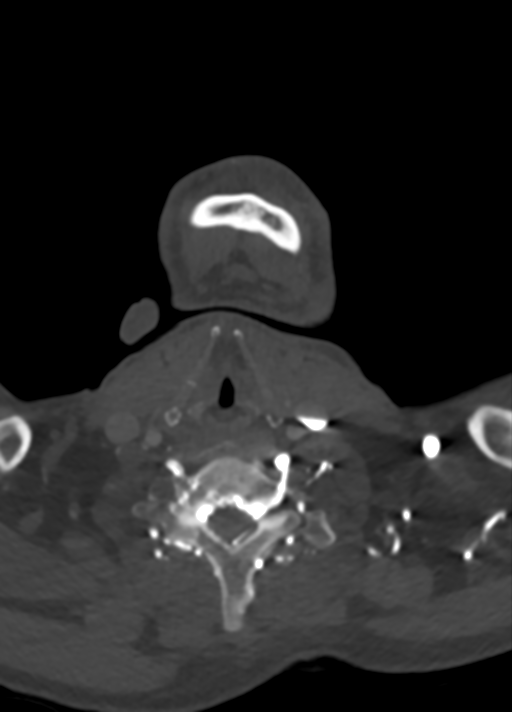
[im 145/337  soft-tissue]
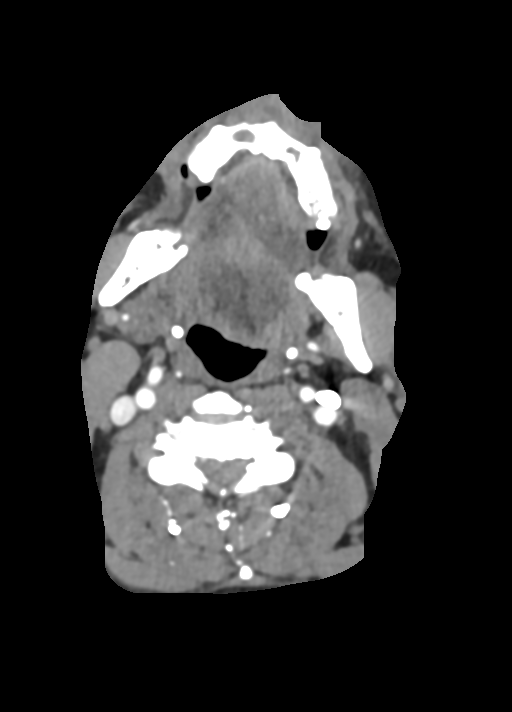
[im 193/337  bone]
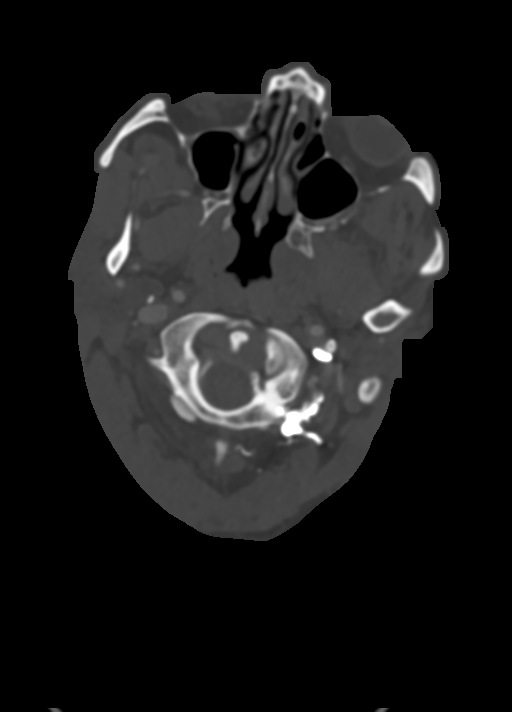
[im 241/337  soft-tissue]
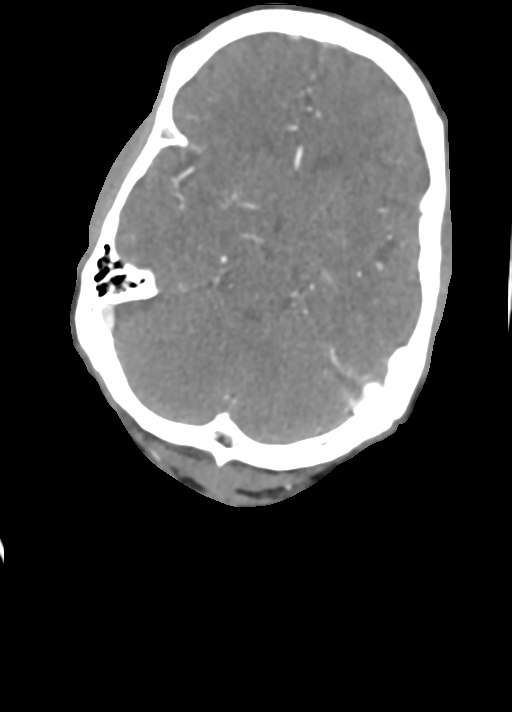
[im 289/337  bone]
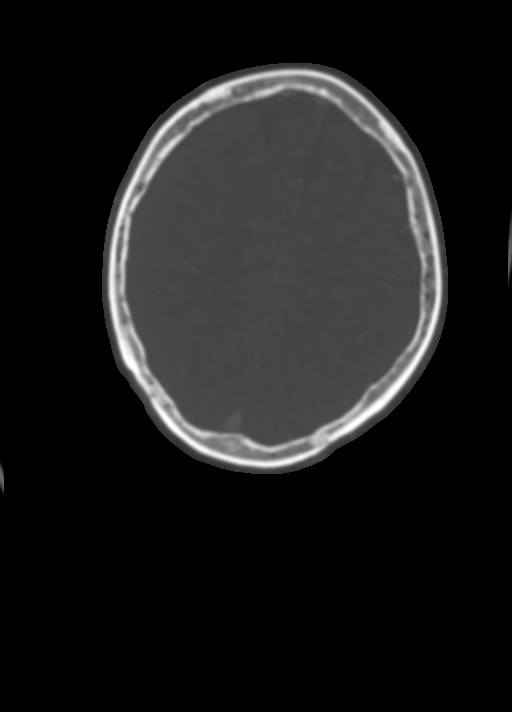

[8 of 33 positions shown; findings below may reference images not displayed]

FINDINGS: CTA NECK FINDINGS

SKELETON: There is no bony spinal canal stenosis. No lytic or
blastic lesion.

OTHER NECK: Normal pharynx, larynx and major salivary glands. No
cervical lymphadenopathy. Unremarkable thyroid gland.

UPPER CHEST: No pneumothorax or pleural effusion. No nodules or
masses.

AORTIC ARCH:

There is no calcific atherosclerosis of the aortic arch. There is no
aneurysm, dissection or hemodynamically significant stenosis of the
visualized portion of the aorta. Conventional 3 vessel aortic
branching pattern. The visualized proximal subclavian arteries are
widely patent.

RIGHT CAROTID SYSTEM: Normal without aneurysm, dissection or
stenosis.

LEFT CAROTID SYSTEM: Normal without aneurysm, dissection or
stenosis.

VERTEBRAL ARTERIES: Left dominant configuration. Both origins are
clearly patent. There is no dissection, occlusion or flow-limiting
stenosis to the skull base (V1-V3 segments).

CTA HEAD FINDINGS

POSTERIOR CIRCULATION:

--Vertebral arteries: Normal V4 segments.

--Inferior cerebellar arteries: Normal.

--Basilar artery: Normal.

--Superior cerebellar arteries: Normal.

--Posterior cerebral arteries (PCA): Normal.

ANTERIOR CIRCULATION:

--Intracranial internal carotid arteries: Normal.

--Anterior cerebral arteries (ACA): Normal. Both A1 segments are
present. Patent anterior communicating artery (a-comm).

--Middle cerebral arteries (MCA): Normal.

VENOUS SINUSES: As permitted by contrast timing, patent.

ANATOMIC VARIANTS: Fetal origins of both posterior cerebral
arteries.

Review of the MIP images confirms the above findings.
IMPRESSION: Normal CTA of the head and neck.

## 2022-12-15 NOTE — Progress Notes (Signed)
[facility] Summary Report / Loop Recorder 

## 2023-01-02 ENCOUNTER — Ambulatory Visit (INDEPENDENT_AMBULATORY_CARE_PROVIDER_SITE_OTHER): Payer: No Typology Code available for payment source

## 2023-01-02 DIAGNOSIS — I639 Cerebral infarction, unspecified: Secondary | ICD-10-CM | POA: Diagnosis not present

## 2023-01-03 LAB — CUP PACEART REMOTE DEVICE CHECK
Date Time Interrogation Session: 20241013231223
Implantable Pulse Generator Implant Date: 20220211

## 2023-01-17 NOTE — Progress Notes (Signed)
[facility] Summary Report / Loop Recorder 

## 2023-02-06 ENCOUNTER — Ambulatory Visit (INDEPENDENT_AMBULATORY_CARE_PROVIDER_SITE_OTHER): Payer: No Typology Code available for payment source

## 2023-02-06 DIAGNOSIS — I639 Cerebral infarction, unspecified: Secondary | ICD-10-CM

## 2023-02-06 LAB — CUP PACEART REMOTE DEVICE CHECK
Date Time Interrogation Session: 20241117230656
Implantable Pulse Generator Implant Date: 20220211

## 2023-03-02 NOTE — Progress Notes (Signed)
[facility] Summary Report / Loop Recorder 

## 2023-03-13 ENCOUNTER — Ambulatory Visit (INDEPENDENT_AMBULATORY_CARE_PROVIDER_SITE_OTHER): Payer: No Typology Code available for payment source

## 2023-03-13 DIAGNOSIS — I639 Cerebral infarction, unspecified: Secondary | ICD-10-CM

## 2023-03-13 LAB — CUP PACEART REMOTE DEVICE CHECK
Date Time Interrogation Session: 20241222230347
Implantable Pulse Generator Implant Date: 20220211

## 2023-04-20 NOTE — Progress Notes (Signed)
[facility] Summary Report / Loop Recorder

## 2023-06-05 ENCOUNTER — Other Ambulatory Visit: Payer: Self-pay | Admitting: Physician Assistant

## 2023-06-05 DIAGNOSIS — R634 Abnormal weight loss: Secondary | ICD-10-CM

## 2023-07-28 ENCOUNTER — Ambulatory Visit
Admission: RE | Admit: 2023-07-28 | Discharge: 2023-07-28 | Disposition: A | Discharge status: Home / Self Care | Source: Ambulatory Visit | Attending: Physician Assistant

## 2023-07-28 DIAGNOSIS — R634 Abnormal weight loss: Secondary | ICD-10-CM

## 2023-07-28 MED ORDER — IOPAMIDOL (ISOVUE-300) INJECTION 61%
100.0000 mL | Freq: Once | INTRAVENOUS | Status: AC | PRN
  Administered 2023-07-28: 100 mL via INTRAVENOUS

## 2024-01-01 ENCOUNTER — Ambulatory Visit

## 2024-01-02 ENCOUNTER — Ambulatory Visit

## 2024-01-02 DIAGNOSIS — I639 Cerebral infarction, unspecified: Secondary | ICD-10-CM

## 2024-01-02 LAB — CUP PACEART REMOTE DEVICE CHECK
Date Time Interrogation Session: 20251013230614
Implantable Pulse Generator Implant Date: 20220211

## 2024-01-03 ENCOUNTER — Ambulatory Visit: Payer: Self-pay | Admitting: Cardiology

## 2024-01-03 NOTE — Progress Notes (Signed)
 Remote Loop Recorder Transmission

## 2024-02-01 ENCOUNTER — Ambulatory Visit

## 2024-02-02 ENCOUNTER — Ambulatory Visit: Attending: Physician Assistant

## 2024-03-03 ENCOUNTER — Ambulatory Visit

## 2024-03-04 ENCOUNTER — Ambulatory Visit

## 2024-04-03 ENCOUNTER — Ambulatory Visit

## 2024-04-04 ENCOUNTER — Ambulatory Visit: Attending: Physician Assistant

## 2024-05-04 ENCOUNTER — Ambulatory Visit

## 2024-05-05 ENCOUNTER — Ambulatory Visit

## 2024-06-05 ENCOUNTER — Ambulatory Visit

## 2024-07-06 ENCOUNTER — Ambulatory Visit

## 2024-08-06 ENCOUNTER — Ambulatory Visit

## 2024-09-06 ENCOUNTER — Ambulatory Visit

## 2024-10-07 ENCOUNTER — Ambulatory Visit

## 2024-11-07 ENCOUNTER — Ambulatory Visit

## 2024-12-08 ENCOUNTER — Ambulatory Visit

## 2025-01-08 ENCOUNTER — Ambulatory Visit

## 2025-02-08 ENCOUNTER — Ambulatory Visit

## 2025-03-11 ENCOUNTER — Ambulatory Visit

## 2025-04-11 ENCOUNTER — Ambulatory Visit
